# Patient Record
Sex: Female | Born: 1945
Health system: Southern US, Community
[De-identification: ages and names within clinical notes are randomized; demographics above are authoritative.]

## PROBLEM LIST (undated history)

## (undated) DIAGNOSIS — F419 Anxiety disorder, unspecified: Secondary | ICD-10-CM

## (undated) DIAGNOSIS — R011 Cardiac murmur, unspecified: Secondary | ICD-10-CM

## (undated) DIAGNOSIS — I10 Essential (primary) hypertension: Secondary | ICD-10-CM

## (undated) DIAGNOSIS — M8580 Other specified disorders of bone density and structure, unspecified site: Secondary | ICD-10-CM

## (undated) DIAGNOSIS — E7800 Pure hypercholesterolemia, unspecified: Secondary | ICD-10-CM

## (undated) DIAGNOSIS — N63 Unspecified lump in unspecified breast: Secondary | ICD-10-CM

## (undated) DIAGNOSIS — N3020 Other chronic cystitis without hematuria: Secondary | ICD-10-CM

## (undated) DIAGNOSIS — N870 Mild cervical dysplasia: Secondary | ICD-10-CM

## (undated) HISTORY — DX: Cardiac murmur, unspecified: R01.1

## (undated) HISTORY — DX: Unspecified lump in unspecified breast: N63.0

## (undated) HISTORY — PX: COLPOSCOPY: SHX161

## (undated) HISTORY — PX: CHOLECYSTECTOMY: SHX55

## (undated) HISTORY — PX: TUBAL LIGATION: SHX77

## (undated) HISTORY — DX: Mild cervical dysplasia: N87.0

## (undated) HISTORY — DX: Other specified disorders of bone density and structure, unspecified site: M85.80

## (undated) HISTORY — PX: OTHER SURGICAL HISTORY: SHX169

## (undated) HISTORY — PX: DILATION AND CURETTAGE OF UTERUS: SHX78

## (undated) HISTORY — PX: CATARACT EXTRACTION: SUR2

## (undated) HISTORY — DX: Other chronic cystitis without hematuria: N30.20

## (undated) HISTORY — DX: Pure hypercholesterolemia, unspecified: E78.00

## (undated) HISTORY — DX: Essential (primary) hypertension: I10

## (undated) HISTORY — PX: BREAST BIOPSY: SHX20

---

## 1999-02-11 ENCOUNTER — Other Ambulatory Visit: Admission: RE | Admit: 1999-02-11 | Discharge: 1999-02-11 | Payer: Self-pay | Admitting: Obstetrics and Gynecology

## 1999-11-15 ENCOUNTER — Emergency Department (HOSPITAL_COMMUNITY): Admission: EM | Admit: 1999-11-15 | Discharge: 1999-11-15 | Payer: Self-pay | Admitting: Podiatry

## 1999-11-15 ENCOUNTER — Encounter: Payer: Self-pay | Admitting: Emergency Medicine

## 1999-11-19 ENCOUNTER — Other Ambulatory Visit: Admission: RE | Admit: 1999-11-19 | Discharge: 1999-11-19 | Payer: Self-pay | Admitting: Obstetrics and Gynecology

## 2000-01-13 ENCOUNTER — Emergency Department (HOSPITAL_COMMUNITY): Admission: EM | Admit: 2000-01-13 | Discharge: 2000-01-13 | Payer: Self-pay | Admitting: Emergency Medicine

## 2000-02-29 ENCOUNTER — Encounter: Payer: Self-pay | Admitting: Obstetrics and Gynecology

## 2000-02-29 ENCOUNTER — Encounter: Admission: RE | Admit: 2000-02-29 | Discharge: 2000-02-29 | Payer: Self-pay | Admitting: Obstetrics and Gynecology

## 2000-07-25 ENCOUNTER — Other Ambulatory Visit: Admission: RE | Admit: 2000-07-25 | Discharge: 2000-07-25 | Payer: Self-pay | Admitting: Obstetrics and Gynecology

## 2001-03-02 ENCOUNTER — Encounter: Payer: Self-pay | Admitting: Obstetrics and Gynecology

## 2001-03-02 ENCOUNTER — Encounter: Admission: RE | Admit: 2001-03-02 | Discharge: 2001-03-02 | Payer: Self-pay | Admitting: Obstetrics and Gynecology

## 2001-07-12 ENCOUNTER — Emergency Department (HOSPITAL_COMMUNITY): Admission: EM | Admit: 2001-07-12 | Discharge: 2001-07-12 | Payer: Self-pay | Admitting: Emergency Medicine

## 2001-07-20 ENCOUNTER — Emergency Department (HOSPITAL_COMMUNITY): Admission: EM | Admit: 2001-07-20 | Discharge: 2001-07-20 | Payer: Self-pay | Admitting: Emergency Medicine

## 2001-07-21 ENCOUNTER — Emergency Department (HOSPITAL_COMMUNITY): Admission: EM | Admit: 2001-07-21 | Discharge: 2001-07-22 | Payer: Self-pay | Admitting: Emergency Medicine

## 2001-09-18 ENCOUNTER — Other Ambulatory Visit: Admission: RE | Admit: 2001-09-18 | Discharge: 2001-09-18 | Payer: Self-pay | Admitting: Obstetrics and Gynecology

## 2002-03-05 ENCOUNTER — Encounter: Payer: Self-pay | Admitting: Obstetrics and Gynecology

## 2002-03-05 ENCOUNTER — Encounter: Admission: RE | Admit: 2002-03-05 | Discharge: 2002-03-05 | Payer: Self-pay | Admitting: Obstetrics and Gynecology

## 2002-09-25 ENCOUNTER — Other Ambulatory Visit: Admission: RE | Admit: 2002-09-25 | Discharge: 2002-09-25 | Payer: Self-pay | Admitting: Obstetrics and Gynecology

## 2003-03-21 ENCOUNTER — Encounter: Payer: Self-pay | Admitting: Obstetrics and Gynecology

## 2003-03-21 ENCOUNTER — Encounter: Admission: RE | Admit: 2003-03-21 | Discharge: 2003-03-21 | Payer: Self-pay | Admitting: Obstetrics and Gynecology

## 2003-11-27 ENCOUNTER — Other Ambulatory Visit: Admission: RE | Admit: 2003-11-27 | Discharge: 2003-11-27 | Payer: Self-pay | Admitting: Obstetrics and Gynecology

## 2004-03-23 ENCOUNTER — Encounter: Admission: RE | Admit: 2004-03-23 | Discharge: 2004-03-23 | Payer: Self-pay | Admitting: Obstetrics and Gynecology

## 2004-12-02 ENCOUNTER — Ambulatory Visit (HOSPITAL_COMMUNITY): Admission: RE | Admit: 2004-12-02 | Discharge: 2004-12-02 | Payer: Self-pay | Admitting: Obstetrics and Gynecology

## 2004-12-02 ENCOUNTER — Other Ambulatory Visit: Admission: RE | Admit: 2004-12-02 | Discharge: 2004-12-02 | Payer: Self-pay | Admitting: Obstetrics and Gynecology

## 2004-12-14 ENCOUNTER — Ambulatory Visit: Payer: Self-pay | Admitting: Internal Medicine

## 2005-01-11 ENCOUNTER — Ambulatory Visit: Payer: Self-pay | Admitting: Internal Medicine

## 2005-02-15 ENCOUNTER — Ambulatory Visit: Payer: Self-pay | Admitting: Internal Medicine

## 2005-03-15 ENCOUNTER — Ambulatory Visit: Payer: Self-pay | Admitting: Internal Medicine

## 2005-03-29 ENCOUNTER — Encounter: Admission: RE | Admit: 2005-03-29 | Discharge: 2005-03-29 | Payer: Self-pay | Admitting: Obstetrics and Gynecology

## 2005-04-05 ENCOUNTER — Encounter: Admission: RE | Admit: 2005-04-05 | Discharge: 2005-04-05 | Payer: Self-pay | Admitting: Obstetrics and Gynecology

## 2005-04-12 ENCOUNTER — Ambulatory Visit: Payer: Self-pay | Admitting: Internal Medicine

## 2005-05-10 ENCOUNTER — Ambulatory Visit: Payer: Self-pay | Admitting: Internal Medicine

## 2005-06-14 ENCOUNTER — Ambulatory Visit: Payer: Self-pay | Admitting: Internal Medicine

## 2005-07-13 ENCOUNTER — Ambulatory Visit: Payer: Self-pay | Admitting: Internal Medicine

## 2005-07-14 ENCOUNTER — Ambulatory Visit: Payer: Self-pay | Admitting: Internal Medicine

## 2005-08-10 ENCOUNTER — Ambulatory Visit: Payer: Self-pay | Admitting: Internal Medicine

## 2005-09-15 ENCOUNTER — Ambulatory Visit: Payer: Self-pay | Admitting: Internal Medicine

## 2005-11-01 ENCOUNTER — Ambulatory Visit: Payer: Self-pay | Admitting: Internal Medicine

## 2005-12-03 ENCOUNTER — Other Ambulatory Visit: Admission: RE | Admit: 2005-12-03 | Discharge: 2005-12-03 | Payer: Self-pay | Admitting: Obstetrics and Gynecology

## 2005-12-07 ENCOUNTER — Ambulatory Visit: Payer: Self-pay | Admitting: Internal Medicine

## 2006-01-10 ENCOUNTER — Ambulatory Visit: Payer: Self-pay | Admitting: Internal Medicine

## 2006-02-07 ENCOUNTER — Ambulatory Visit: Payer: Self-pay | Admitting: Internal Medicine

## 2006-02-08 ENCOUNTER — Ambulatory Visit: Payer: Self-pay | Admitting: Internal Medicine

## 2006-03-31 ENCOUNTER — Encounter: Admission: RE | Admit: 2006-03-31 | Discharge: 2006-03-31 | Payer: Self-pay | Admitting: Obstetrics and Gynecology

## 2006-06-20 ENCOUNTER — Ambulatory Visit: Payer: Self-pay | Admitting: Internal Medicine

## 2006-12-05 ENCOUNTER — Other Ambulatory Visit: Admission: RE | Admit: 2006-12-05 | Discharge: 2006-12-05 | Payer: Self-pay | Admitting: Obstetrics and Gynecology

## 2007-04-03 ENCOUNTER — Encounter: Admission: RE | Admit: 2007-04-03 | Discharge: 2007-04-03 | Payer: Self-pay | Admitting: Family Medicine

## 2007-12-07 HISTORY — PX: CERVICAL BIOPSY  W/ LOOP ELECTRODE EXCISION: SUR135

## 2007-12-11 ENCOUNTER — Other Ambulatory Visit: Admission: RE | Admit: 2007-12-11 | Discharge: 2007-12-11 | Payer: Self-pay | Admitting: Obstetrics and Gynecology

## 2007-12-28 ENCOUNTER — Encounter: Payer: Self-pay | Admitting: Obstetrics and Gynecology

## 2007-12-28 ENCOUNTER — Ambulatory Visit (HOSPITAL_BASED_OUTPATIENT_CLINIC_OR_DEPARTMENT_OTHER): Admission: RE | Admit: 2007-12-28 | Discharge: 2007-12-28 | Payer: Self-pay | Admitting: Obstetrics and Gynecology

## 2008-04-10 ENCOUNTER — Encounter: Admission: RE | Admit: 2008-04-10 | Discharge: 2008-04-10 | Payer: Self-pay | Admitting: Obstetrics and Gynecology

## 2008-05-20 ENCOUNTER — Other Ambulatory Visit: Admission: RE | Admit: 2008-05-20 | Discharge: 2008-05-20 | Payer: Self-pay | Admitting: Obstetrics and Gynecology

## 2008-11-11 ENCOUNTER — Ambulatory Visit: Payer: Self-pay | Admitting: Obstetrics and Gynecology

## 2008-11-27 ENCOUNTER — Ambulatory Visit: Payer: Self-pay | Admitting: Obstetrics and Gynecology

## 2008-12-11 ENCOUNTER — Ambulatory Visit: Payer: Self-pay | Admitting: Obstetrics and Gynecology

## 2008-12-11 ENCOUNTER — Other Ambulatory Visit: Admission: RE | Admit: 2008-12-11 | Discharge: 2008-12-11 | Payer: Self-pay | Admitting: Obstetrics and Gynecology

## 2008-12-11 ENCOUNTER — Encounter: Payer: Self-pay | Admitting: Obstetrics and Gynecology

## 2009-02-07 ENCOUNTER — Ambulatory Visit: Payer: Self-pay | Admitting: Obstetrics and Gynecology

## 2009-02-26 ENCOUNTER — Ambulatory Visit: Payer: Self-pay | Admitting: Obstetrics and Gynecology

## 2009-04-22 ENCOUNTER — Encounter: Admission: RE | Admit: 2009-04-22 | Discharge: 2009-04-22 | Payer: Self-pay | Admitting: Obstetrics and Gynecology

## 2009-09-24 ENCOUNTER — Ambulatory Visit: Payer: Self-pay | Admitting: Obstetrics and Gynecology

## 2009-10-07 ENCOUNTER — Ambulatory Visit: Payer: Self-pay | Admitting: Obstetrics and Gynecology

## 2009-10-28 ENCOUNTER — Emergency Department (HOSPITAL_COMMUNITY): Admission: EM | Admit: 2009-10-28 | Discharge: 2009-10-28 | Payer: Self-pay | Admitting: Emergency Medicine

## 2010-01-16 ENCOUNTER — Ambulatory Visit (HOSPITAL_BASED_OUTPATIENT_CLINIC_OR_DEPARTMENT_OTHER): Admission: RE | Admit: 2010-01-16 | Discharge: 2010-01-16 | Payer: Self-pay | Admitting: Orthopedic Surgery

## 2010-03-29 ENCOUNTER — Emergency Department (HOSPITAL_COMMUNITY): Admission: EM | Admit: 2010-03-29 | Discharge: 2010-03-29 | Payer: Self-pay | Admitting: Emergency Medicine

## 2010-05-01 ENCOUNTER — Encounter: Admission: RE | Admit: 2010-05-01 | Discharge: 2010-05-01 | Payer: Self-pay | Admitting: Family Medicine

## 2010-05-27 ENCOUNTER — Ambulatory Visit: Payer: Self-pay | Admitting: Obstetrics and Gynecology

## 2010-05-27 ENCOUNTER — Other Ambulatory Visit: Admission: RE | Admit: 2010-05-27 | Discharge: 2010-05-27 | Payer: Self-pay | Admitting: Obstetrics and Gynecology

## 2011-02-26 ENCOUNTER — Ambulatory Visit (INDEPENDENT_AMBULATORY_CARE_PROVIDER_SITE_OTHER): Payer: PRIVATE HEALTH INSURANCE | Admitting: Women's Health

## 2011-02-26 DIAGNOSIS — R82998 Other abnormal findings in urine: Secondary | ICD-10-CM

## 2011-02-26 DIAGNOSIS — B373 Candidiasis of vulva and vagina: Secondary | ICD-10-CM

## 2011-02-26 DIAGNOSIS — N898 Other specified noninflammatory disorders of vagina: Secondary | ICD-10-CM

## 2011-03-24 ENCOUNTER — Other Ambulatory Visit: Payer: Self-pay | Admitting: Obstetrics and Gynecology

## 2011-03-24 DIAGNOSIS — Z1231 Encounter for screening mammogram for malignant neoplasm of breast: Secondary | ICD-10-CM

## 2011-04-20 NOTE — Op Note (Signed)
Sheila Keller, Sheila Keller               ACCOUNT NO.:  [id number]   MEDICAL RECORD NO.:  [id number]          PATIENT TYPE:  AMB   LOCATION:  [facility]                         FACILITY:  [facility]   PHYSICIAN:  Daniel L. Gottsegen, M.D.DATE OF BIRTH:  1946-07-31   DATE OF PROCEDURE:  12/28/2007  DATE OF DISCHARGE:                               OPERATIVE REPORT   PRE AND POSTOPERATIVE DIAGNOSIS:  Cervical intraepithelial neoplasia  with canal involvement.   OPERATIONS:  LEEP cone with endocervical button.   SURGEON:  Daniel L. Eda Paschal, M.D.   ANESTHESIA:  General.   INDICATIONS:  The patient is a 65 year old female who had presented to  the office for her yearly physical.  Pap smear showed cervical  intraepithelial neoplasia.  Colposcopy was done in the office revealing  some white epithelium entering the canal.  It was difficult to get high  into the canal because of cervical stenosis, so it was not a perfectly  satisfactory colposcopy; however colposcopic biopsies from the  ectocervix plus ECC from the canal as high as we could get both were  positive for dysplasia.  As a result of this she now enters for cervical  LEEP cone for treatment of cervical dysplasia with canal involvement.   FINDINGS:  External exam was normal.  B U S is normal.  Vaginal and  cervical exam shows significant atrophy.  Uterus is normal size and  shape.  Adnexa failed to reveal masses.  The patient was recolposcoped  and once again we could see white epithelium entering the external os  for as high as we could see with the colposcope.   PROCEDURE:  After adequate general anesthesia the patient was placed in  the dorsal supine position, prepped and draped usual sterile manner.  A  speculum coated specifically for LEEP was utilized.  Colposcopy was done  with 4% acetic acid.  Findings were as noted above.  A solution of  1:200,000 epinephrine and 1% Xylocaine was injected around the cervix.  Total of 8 mL was  utilized injecting all four quadrants.  Using a single-  tooth tenaculum and a very small dilator we were able to dilate her os  enough that we could now be comfortable that we had gotten safely into  the endocervical canal.  First a LEEP cone was done with a 20 x 12 loop,  settings were 60 coag, 60 cutting and a specimen to approximately 7 mm  was obtained.  It was cut at 12 o'clock and sent to pathology for tissue  diagnosis.  Because of the canal involvement an endocervical button was  then obtained with a much smaller loop without any trouble and this was  sent separately for tissue diagnosis.  There was little bit of oozing.  Using a ball-tip this was completely eradicated.  Monsel's was placed  and at the termination of procedure there was no bleeding whatsoever.  The patient left the operating satisfactory condition.  Blood loss was  minimal.      Reuel Boom L. Eda Paschal, M.D.  Electronically Signed     DLG/MEDQ  D:  12/28/2007  T:  12/28/2007  Job:  [id number]

## 2011-04-23 NOTE — Assessment & Plan Note (Signed)
[facility]                               PULMONARY OFFICE NOTE   NAME:Pacitti, VALISHA HESLIN                      MRN:          [id number]  DATE:06/20/2006                            DOB:          [birth year]    DIAGNOSES:  1.  Yellowjacket hypersensitivity.  2.  Allergic rhinitis.  3.  Diabetes.   HISTORY:  She was stung on the finger by a yellowjacket this morning around  10 a.m. when she swatted it away from her face with her hand.  She  immediately took 120 mg of Allegra.  She has an EpiPen but did not use it.  Now, about a half-hour after the sting, she has local swelling over the  finger and is anxious but otherwise does not notice systemic symptoms.   OBJECTIVE:  VITAL SIGNS:  Blood pressure 148/88, pulse regular at 90.  GENERAL:  She is alert.  Speech is clear.  Breathing is unlabored.  LUNGS:  Clear.  HEART:  Heart sounds are normal.  EXTREMITIES:  There is edema and erythema at the base of her right middle  finger with minimal edema and erythema extending also to the fourth finger.  She had taken her rings off.   IMPRESSION:  Local reaction to insect sting.  She had previously had about  five years of venom desensitization therapy.   PLAN:  She is going to wait her one-half hour for observation.  She is given  Zyrtec 10 mg p.o. and EpiPen injection, with observation, all well-  tolerated.  She will keep her scheduled appointment in September, returning  earlier p.r.n.  Stinging insect avoidance is again emphasized.                                   Clinton D. Maple Hudson, MD, FCCP, FACP   CDY/MedQ  DD:  06/23/2006  DT:  06/23/2006  Job #:  [id number]   cc:   Molly Maduro A. Nicholos Johns, MD

## 2011-06-03 ENCOUNTER — Other Ambulatory Visit (HOSPITAL_COMMUNITY)
Admission: RE | Admit: 2011-06-03 | Discharge: 2011-06-03 | Disposition: A | Discharge status: Home / Self Care | Payer: PRIVATE HEALTH INSURANCE | Source: Ambulatory Visit | Attending: Obstetrics and Gynecology | Admitting: Obstetrics and Gynecology

## 2011-06-03 ENCOUNTER — Encounter (INDEPENDENT_AMBULATORY_CARE_PROVIDER_SITE_OTHER): Payer: PRIVATE HEALTH INSURANCE | Admitting: Obstetrics and Gynecology

## 2011-06-03 ENCOUNTER — Other Ambulatory Visit: Payer: Self-pay | Admitting: Obstetrics and Gynecology

## 2011-06-03 DIAGNOSIS — R809 Proteinuria, unspecified: Secondary | ICD-10-CM

## 2011-06-03 DIAGNOSIS — B373 Candidiasis of vulva and vagina: Secondary | ICD-10-CM

## 2011-06-03 DIAGNOSIS — Z01419 Encounter for gynecological examination (general) (routine) without abnormal findings: Secondary | ICD-10-CM

## 2011-06-03 DIAGNOSIS — Z124 Encounter for screening for malignant neoplasm of cervix: Secondary | ICD-10-CM | POA: Insufficient documentation

## 2011-06-03 DIAGNOSIS — B3731 Acute candidiasis of vulva and vagina: Secondary | ICD-10-CM

## 2011-06-04 ENCOUNTER — Ambulatory Visit
Admission: RE | Admit: 2011-06-04 | Discharge: 2011-06-04 | Disposition: A | Discharge status: Home / Self Care | Payer: PRIVATE HEALTH INSURANCE | Source: Ambulatory Visit | Attending: Obstetrics and Gynecology | Admitting: Obstetrics and Gynecology

## 2011-06-04 ENCOUNTER — Encounter: Payer: PRIVATE HEALTH INSURANCE | Admitting: Obstetrics and Gynecology

## 2011-06-04 DIAGNOSIS — Z1231 Encounter for screening mammogram for malignant neoplasm of breast: Secondary | ICD-10-CM

## 2011-08-26 LAB — I-STAT 8, (EC8 V) (CONVERTED LAB)
Acid-base deficit: 3 — ABNORMAL HIGH
BUN: 7
Bicarbonate: 19.7 — ABNORMAL LOW
Chloride: 110
Glucose, Bld: 104 — ABNORMAL HIGH
HCT: 41
Hemoglobin: 13.9
Operator id: 114531
Potassium: 4.2
Sodium: 140
TCO2: 21
pCO2, Ven: 28.7 — ABNORMAL LOW
pH, Ven: 7.444 — ABNORMAL HIGH

## 2012-01-24 ENCOUNTER — Ambulatory Visit (INDEPENDENT_AMBULATORY_CARE_PROVIDER_SITE_OTHER): Payer: PRIVATE HEALTH INSURANCE | Admitting: Obstetrics and Gynecology

## 2012-01-24 VITALS — BP 124/78

## 2012-01-24 DIAGNOSIS — N9089 Other specified noninflammatory disorders of vulva and perineum: Secondary | ICD-10-CM

## 2012-01-24 DIAGNOSIS — N898 Other specified noninflammatory disorders of vagina: Secondary | ICD-10-CM

## 2012-01-24 MED ORDER — FLUCONAZOLE 150 MG PO TABS: 150.0000 mg | ORAL_TABLET | Freq: Every day | ORAL | Status: AC

## 2012-01-24 NOTE — Progress Notes (Signed)
Patient came to see me today with to concerns. #1 she has a raised area near her left labia which is new. #2 she is having some vaginal discharge with itching. She is diabetic.  Exam: Kennon Portela present. External: Narrow left buttock on the left there is a raised red lesion which is nonfluctuant. It is 1-2 mm. There is no surrounding cellulitis. It is not atypical looking at all. BUS: Within normal limits. Vaginal exam: Typical yeast like discharge. Wet prep was negative.  Assessment: #1. Nonsuspicious new lesion of left vulva. #2 yeast vaginitis.  Plan: I do not believe this lesion needs to be biopsied. She will clean it with pHisoHex and warm water. Diflucan 150 mg daily for 4 days.

## 2012-01-26 ENCOUNTER — Telehealth: Payer: Self-pay | Admitting: *Deleted

## 2012-01-26 MED ORDER — HEXACHLOROPHENE 3 % EX LIQD: Freq: Every day | CUTANEOUS | Status: AC

## 2012-01-26 NOTE — Telephone Encounter (Signed)
Give her pHisoHex. It is a so that comes a bottle. Have her apply it to the area 3 times a day.

## 2012-01-26 NOTE — Telephone Encounter (Signed)
rx sent to pharmacy, pt informed rx sent to pharmacy.

## 2012-01-26 NOTE — Telephone Encounter (Signed)
Pt was seen on 01/24/12 and was told to buy pHisoHex. Pt went to pharmacy and was told she will need a rx for this. Please advise

## 2012-05-08 ENCOUNTER — Other Ambulatory Visit: Payer: Self-pay | Admitting: Obstetrics and Gynecology

## 2012-05-08 DIAGNOSIS — Z1231 Encounter for screening mammogram for malignant neoplasm of breast: Secondary | ICD-10-CM

## 2012-06-05 ENCOUNTER — Ambulatory Visit
Admission: RE | Admit: 2012-06-05 | Discharge: 2012-06-05 | Disposition: A | Discharge status: Home / Self Care | Payer: PRIVATE HEALTH INSURANCE | Source: Ambulatory Visit | Attending: Obstetrics and Gynecology | Admitting: Obstetrics and Gynecology

## 2012-06-05 ENCOUNTER — Other Ambulatory Visit (HOSPITAL_COMMUNITY)
Admission: RE | Admit: 2012-06-05 | Discharge: 2012-06-05 | Disposition: A | Discharge status: Home / Self Care | Payer: PRIVATE HEALTH INSURANCE | Source: Ambulatory Visit | Attending: Obstetrics and Gynecology | Admitting: Obstetrics and Gynecology

## 2012-06-05 ENCOUNTER — Encounter: Payer: Self-pay | Admitting: Obstetrics and Gynecology

## 2012-06-05 ENCOUNTER — Ambulatory Visit (INDEPENDENT_AMBULATORY_CARE_PROVIDER_SITE_OTHER): Payer: PRIVATE HEALTH INSURANCE | Admitting: Obstetrics and Gynecology

## 2012-06-05 VITALS — BP 116/74 | Ht 63.5 in | Wt 232.0 lb

## 2012-06-05 DIAGNOSIS — Z1231 Encounter for screening mammogram for malignant neoplasm of breast: Secondary | ICD-10-CM

## 2012-06-05 DIAGNOSIS — Z01419 Encounter for gynecological examination (general) (routine) without abnormal findings: Secondary | ICD-10-CM | POA: Insufficient documentation

## 2012-06-05 DIAGNOSIS — M8580 Other specified disorders of bone density and structure, unspecified site: Secondary | ICD-10-CM

## 2012-06-05 DIAGNOSIS — I10 Essential (primary) hypertension: Secondary | ICD-10-CM | POA: Insufficient documentation

## 2012-06-05 DIAGNOSIS — M949 Disorder of cartilage, unspecified: Secondary | ICD-10-CM

## 2012-06-05 DIAGNOSIS — E7800 Pure hypercholesterolemia, unspecified: Secondary | ICD-10-CM | POA: Insufficient documentation

## 2012-06-05 NOTE — Patient Instructions (Addendum)
Call cancer center and make appointment to discuss BRCA-1 and BRCA-2 testing due to mother and aunt.

## 2012-06-05 NOTE — Progress Notes (Signed)
Patient came to see me today for her annual GYN exam. She was treated by me with a LEEP in 2009 for CIN-1 involving the endocervical canal and has had 3 normal Pap since then. She is having no vaginal bleeding. She is having no pelvic pain. She is scheduled for a mammogram today. She is due for followup bone density due to osteopenia. She has had no fractures. Her mother died this year. She does have some hot flashes. She seems to do well with them. She takes Evista through Dr. Norvel Richards office and does well. He does her lab work. Her mother had breast cancer at age 70 and her maternal aunt had even younger. There is no other family history of breast or ovarian cancer.  Physical examination: Kennon Portela present.  HEENT within normal limits. Neck: Thyroid not large. No masses. Supraclavicular nodes: not enlarged. Breasts: Examined in both sitting and lying  position. No skin changes and no masses. Abdomen: Soft no guarding rebound or masses or hernia. Pelvic: External: Within normal limits. BUS: Within normal limits. Vaginal:within normal limits. Good estrogen effect. No evidence of cystocele rectocele or enterocele. Cervix: clean. Uterus: Normal size and shape. Adnexa: No masses. Rectovaginal exam: Confirmatory and negative. Extremities: Within normal limits.  Assessment: #1. CIN-1 with endocervical canal involvement #2. Osteopenia #3. Family history of early onset breast cancer  Plan: Mammogram. Bone density. Genetic counseling at the breast Center.The new Pap smear guidelines were discussed with the patient. Pap smear done.

## 2012-06-05 NOTE — Addendum Note (Signed)
Addended by: Dayna Barker on: 06/05/2012 09:51 AM   Modules accepted: Orders

## 2012-06-06 LAB — URINALYSIS W MICROSCOPIC + REFLEX CULTURE
Casts: NONE SEEN
Crystals: NONE SEEN
Glucose, UA: NEGATIVE mg/dL
Hgb urine dipstick: NEGATIVE
Ketones, ur: NEGATIVE mg/dL
Nitrite: NEGATIVE
Specific Gravity, Urine: 1.026 (ref 1.005–1.030)
pH: 5.5 (ref 5.0–8.0)

## 2012-06-07 LAB — URINE CULTURE

## 2012-07-27 ENCOUNTER — Other Ambulatory Visit: Payer: Self-pay | Admitting: Gynecology

## 2012-07-27 ENCOUNTER — Ambulatory Visit (INDEPENDENT_AMBULATORY_CARE_PROVIDER_SITE_OTHER): Payer: PRIVATE HEALTH INSURANCE | Admitting: Gynecology

## 2012-07-27 ENCOUNTER — Encounter: Payer: Self-pay | Admitting: Gynecology

## 2012-07-27 VITALS — BP 130/70

## 2012-07-27 DIAGNOSIS — R3915 Urgency of urination: Secondary | ICD-10-CM

## 2012-07-27 DIAGNOSIS — N390 Urinary tract infection, site not specified: Secondary | ICD-10-CM

## 2012-07-27 LAB — URINALYSIS W MICROSCOPIC + REFLEX CULTURE
Casts: NONE SEEN
Crystals: NONE SEEN
Glucose, UA: NEGATIVE mg/dL
Ketones, ur: NEGATIVE mg/dL
Nitrite: NEGATIVE
Specific Gravity, Urine: 1.025 (ref 1.005–1.030)
pH: 5 (ref 5.0–8.0)

## 2012-07-27 MED ORDER — NITROFURANTOIN MONOHYD MACRO 100 MG PO CAPS: 100.0000 mg | ORAL_CAPSULE | Freq: Two times a day (BID) | ORAL | Status: AC

## 2012-07-27 NOTE — Patient Instructions (Addendum)

## 2012-07-27 NOTE — Progress Notes (Signed)
Patient presented to the office today complaining for the past 3 days of urinary frequency and completely emptying and some slight back discomfort and suprapubic pressure. She denied any fever chills nausea or vomiting. She is allergic to penicillin.  Exam: Back no CVA tenderness Abdomen is some slight suprapubic tenderness  Urinalysis 7-10 WBCs, 3-6 RBC and rare bacteria  Assessment/plan based on patient's clinical symptoms we'll treat her for urinary tract infection with Macrobid one by mouth twice a day for 7 days and give her a sample of Uribell antispasmodic agent to take 1 by mouth 4 times a day for 2 days. Culture pending at time of this dictation.

## 2012-07-31 ENCOUNTER — Other Ambulatory Visit: Payer: Self-pay | Admitting: Gynecology

## 2012-07-31 MED ORDER — CIPROFLOXACIN HCL 250 MG PO TABS: 250.0000 mg | ORAL_TABLET | Freq: Two times a day (BID) | ORAL | Status: AC

## 2012-08-09 ENCOUNTER — Ambulatory Visit (INDEPENDENT_AMBULATORY_CARE_PROVIDER_SITE_OTHER): Payer: PRIVATE HEALTH INSURANCE | Admitting: Obstetrics and Gynecology

## 2012-08-09 DIAGNOSIS — N764 Abscess of vulva: Secondary | ICD-10-CM

## 2012-08-09 MED ORDER — SULFAMETHOXAZOLE-TRIMETHOPRIM 800-160 MG PO TABS: 1.0000 | ORAL_TABLET | Freq: Two times a day (BID) | ORAL | Status: AC

## 2012-08-09 NOTE — Patient Instructions (Signed)
Warm soaks to the left labia 4 times a day. Take antibiotic 2 times a day. Return to the office next week.

## 2012-08-09 NOTE — Progress Notes (Signed)
Patient came to see me today with an abscess of her left labia that has been there for less than one week and is causing severe pain and inability to sit.  Exam: Kennon Portela present. External: There is a 2 cm abscess of her left labia with slight skin cellulitis.  Assessment: Left labial abscess  Plan: The area was prepped and draped in a sterile manner. One percent plain Xylocaine was used for anesthesia. An incision was made over the abscess with a scalpel. Significant drainage occurred. Loculations were broken up with a hemostat. Hemostasis was obtained with Monsel solution. The patient will continue to soak the area 4 times a day. She was started on Septra DS twice a day for 7 days. She will return to the office next week.

## 2012-08-14 ENCOUNTER — Ambulatory Visit (INDEPENDENT_AMBULATORY_CARE_PROVIDER_SITE_OTHER): Payer: PRIVATE HEALTH INSURANCE | Admitting: Obstetrics and Gynecology

## 2012-08-14 DIAGNOSIS — N764 Abscess of vulva: Secondary | ICD-10-CM

## 2012-08-14 DIAGNOSIS — N9089 Other specified noninflammatory disorders of vulva and perineum: Secondary | ICD-10-CM

## 2012-08-14 NOTE — Patient Instructions (Signed)
Continue warm soaks. Finish antibiotics. Return in 2 weeks if not completely healed.

## 2012-08-14 NOTE — Progress Notes (Signed)
Patient came to see me today for followup after I had I and D a labial abscess last week. She is feeling much better. She continues to soak it. She is almost done with her antibiotics.  Exam: Kennon Portela present. External: This site of the incision is healing well. All cellulitis is gone. There is no fluctuance left.  Assessment: Healing labial abscess.  Plan: Continue warm soaks. Finish antibiotics. Return in 2 weeks if not 100% healed.

## 2013-01-03 ENCOUNTER — Other Ambulatory Visit: Payer: Self-pay | Admitting: Gynecology

## 2013-01-03 DIAGNOSIS — M8580 Other specified disorders of bone density and structure, unspecified site: Secondary | ICD-10-CM

## 2013-01-04 ENCOUNTER — Encounter: Payer: Self-pay | Admitting: Gynecology

## 2013-01-04 ENCOUNTER — Ambulatory Visit (INDEPENDENT_AMBULATORY_CARE_PROVIDER_SITE_OTHER): Payer: PRIVATE HEALTH INSURANCE | Admitting: Gynecology

## 2013-01-04 ENCOUNTER — Ambulatory Visit (INDEPENDENT_AMBULATORY_CARE_PROVIDER_SITE_OTHER): Payer: PRIVATE HEALTH INSURANCE

## 2013-01-04 VITALS — BP 128/74

## 2013-01-04 DIAGNOSIS — M8580 Other specified disorders of bone density and structure, unspecified site: Secondary | ICD-10-CM

## 2013-01-04 DIAGNOSIS — M899 Disorder of bone, unspecified: Secondary | ICD-10-CM

## 2013-01-04 DIAGNOSIS — R300 Dysuria: Secondary | ICD-10-CM

## 2013-01-04 DIAGNOSIS — N390 Urinary tract infection, site not specified: Secondary | ICD-10-CM

## 2013-01-04 LAB — URINALYSIS W MICROSCOPIC + REFLEX CULTURE
Casts: NONE SEEN
Crystals: NONE SEEN
Nitrite: NEGATIVE
Specific Gravity, Urine: >1.03 — ABNORMAL HIGH (ref 1.005–1.030)
Urobilinogen, UA: 0.2 mg/dL (ref 0.0–1.0)
pH: 5 (ref 5.0–8.0)

## 2013-01-04 MED ORDER — FLUCONAZOLE 100 MG PO TABS: ORAL_TABLET | ORAL | Status: DC

## 2013-01-04 MED ORDER — CIPROFLOXACIN HCL 250 MG PO TABS: 250.0000 mg | ORAL_TABLET | Freq: Two times a day (BID) | ORAL | Status: DC

## 2013-01-04 NOTE — Progress Notes (Signed)
67 year old patient who presented to the office today stating that for the past 2 days she's been complaining of frequency and burning along with some suprapubic discomfort and some vague low back discomfort. She denied any fever chills nausea or vomiting. She is a type II diabetic. She is allergic to penicillin.  Exam: Patient with no CVA tenderness some slight suprapubic tenderness no pelvic exam done  Urinalysis demonstrated 11-20 WBC, many bacteria  Assessment/plan: Urinary tract infection and she will be treated with Cipro 250 mg twice a day for 3 days. She was given sample of Uribell to take 1 by mouth 4 times a day for 2 days. Patient states that she becomes sensitive to antibiotics and sometimes may develop yeast infection for this reason a prescription for Diflucan 100 mg to take 1 by mouth was provided as well.

## 2013-01-04 NOTE — Patient Instructions (Addendum)
Urinary Tract Infection Urinary tract infections (UTIs) can develop anywhere along your urinary tract. Your urinary tract is your body's drainage system for removing wastes and extra water. Your urinary tract includes two kidneys, two ureters, a bladder, and a urethra. Your kidneys are a pair of bean-shaped organs. Each kidney is about the size of your fist. They are located below your ribs, one on each side of your spine. CAUSES Infections are caused by microbes, which are microscopic organisms, including fungi, viruses, and bacteria. These organisms are so small that they can only be seen through a microscope. Bacteria are the microbes that most commonly cause UTIs. SYMPTOMS  Symptoms of UTIs may vary by age and gender of the patient and by the location of the infection. Symptoms in young women typically include a frequent and intense urge to urinate and a painful, burning feeling in the bladder or urethra during urination. Older women and men are more likely to be tired, shaky, and weak and have muscle aches and abdominal pain. A fever may mean the infection is in your kidneys. Other symptoms of a kidney infection include pain in your back or sides below the ribs, nausea, and vomiting. DIAGNOSIS To diagnose a UTI, your caregiver will ask you about your symptoms. Your caregiver also will ask to provide a urine sample. The urine sample will be tested for bacteria and white blood cells. White blood cells are made by your body to help fight infection. TREATMENT  Typically, UTIs can be treated with medication. Because most UTIs are caused by a bacterial infection, they usually can be treated with the use of antibiotics. The choice of antibiotic and length of treatment depend on your symptoms and the type of bacteria causing your infection. HOME CARE INSTRUCTIONS  If you were prescribed antibiotics, take them exactly as your caregiver instructs you. Finish the medication even if you feel better after you  have only taken some of the medication.  Drink enough water and fluids to keep your urine clear or pale yellow.  Avoid caffeine, tea, and carbonated beverages. They tend to irritate your bladder.  Empty your bladder often. Avoid holding urine for long periods of time.  Empty your bladder before and after sexual intercourse.  After a bowel movement, women should cleanse from front to back. Use each tissue only once. SEEK MEDICAL CARE IF:   You have back pain.  You develop a fever.  Your symptoms do not begin to resolve within 3 days. SEEK IMMEDIATE MEDICAL CARE IF:   You have severe back pain or lower abdominal pain.  You develop chills.  You have nausea or vomiting.  You have continued burning or discomfort with urination. MAKE SURE YOU:   Understand these instructions.  Will watch your condition.  Will get help right away if you are not doing well or get worse. Document Released: 09/01/2005 Document Revised: 05/23/2012 Document Reviewed: 12/31/2011 Exit[employer] Patient Information 2013 [employer].  

## 2013-01-05 LAB — URINE CULTURE

## 2013-01-11 ENCOUNTER — Other Ambulatory Visit: Payer: Self-pay | Admitting: *Deleted

## 2013-01-11 DIAGNOSIS — M8580 Other specified disorders of bone density and structure, unspecified site: Secondary | ICD-10-CM

## 2013-01-12 ENCOUNTER — Other Ambulatory Visit: Payer: PRIVATE HEALTH INSURANCE

## 2013-01-12 DIAGNOSIS — M8580 Other specified disorders of bone density and structure, unspecified site: Secondary | ICD-10-CM

## 2013-01-13 LAB — VITAMIN D 25 HYDROXY (VIT D DEFICIENCY, FRACTURES): Vit D, 25-Hydroxy: <10 ng/mL — ABNORMAL LOW (ref 30–89)

## 2013-01-15 ENCOUNTER — Other Ambulatory Visit: Payer: Self-pay | Admitting: Gynecology

## 2013-01-15 DIAGNOSIS — E559 Vitamin D deficiency, unspecified: Secondary | ICD-10-CM

## 2013-01-15 LAB — PTH, INTACT AND CALCIUM: Calcium, Total (PTH): 8.9 mg/dL (ref 8.4–10.5)

## 2013-01-15 MED ORDER — ERGOCALCIFEROL 1.25 MG (50000 UT) PO CAPS: 50000.0000 [IU] | ORAL_CAPSULE | ORAL | Status: DC

## 2013-04-02 ENCOUNTER — Other Ambulatory Visit: Payer: PRIVATE HEALTH INSURANCE

## 2013-04-02 DIAGNOSIS — E559 Vitamin D deficiency, unspecified: Secondary | ICD-10-CM

## 2013-04-03 ENCOUNTER — Other Ambulatory Visit: Payer: Self-pay | Admitting: Gynecology

## 2013-04-03 DIAGNOSIS — E559 Vitamin D deficiency, unspecified: Secondary | ICD-10-CM

## 2013-04-03 LAB — PTH, INTACT AND CALCIUM: PTH: 96.6 pg/mL — ABNORMAL HIGH (ref 14.0–72.0)

## 2013-04-03 MED ORDER — ERGOCALCIFEROL 1.25 MG (50000 UT) PO CAPS: ORAL_CAPSULE | ORAL | Status: DC

## 2013-04-24 ENCOUNTER — Other Ambulatory Visit: Payer: Self-pay

## 2013-05-02 ENCOUNTER — Other Ambulatory Visit: Payer: Self-pay

## 2013-05-02 DIAGNOSIS — Z1231 Encounter for screening mammogram for malignant neoplasm of breast: Secondary | ICD-10-CM

## 2013-06-11 ENCOUNTER — Ambulatory Visit
Admission: RE | Admit: 2013-06-11 | Discharge: 2013-06-11 | Disposition: A | Discharge status: Home / Self Care | Payer: PRIVATE HEALTH INSURANCE | Source: Ambulatory Visit

## 2013-06-11 DIAGNOSIS — Z1231 Encounter for screening mammogram for malignant neoplasm of breast: Secondary | ICD-10-CM

## 2013-06-15 ENCOUNTER — Other Ambulatory Visit: Payer: PRIVATE HEALTH INSURANCE

## 2013-06-15 DIAGNOSIS — E559 Vitamin D deficiency, unspecified: Secondary | ICD-10-CM

## 2013-06-22 ENCOUNTER — Encounter: Payer: Self-pay | Admitting: Gynecology

## 2013-06-22 ENCOUNTER — Other Ambulatory Visit (HOSPITAL_COMMUNITY)
Admission: RE | Admit: 2013-06-22 | Discharge: 2013-06-22 | Disposition: A | Discharge status: Home / Self Care | Payer: PRIVATE HEALTH INSURANCE | Source: Ambulatory Visit | Attending: Gynecology | Admitting: Gynecology

## 2013-06-22 ENCOUNTER — Ambulatory Visit (INDEPENDENT_AMBULATORY_CARE_PROVIDER_SITE_OTHER): Payer: PRIVATE HEALTH INSURANCE | Admitting: Gynecology

## 2013-06-22 VITALS — BP 138/86 | Ht 63.5 in | Wt 242.0 lb

## 2013-06-22 DIAGNOSIS — Z1159 Encounter for screening for other viral diseases: Secondary | ICD-10-CM

## 2013-06-22 DIAGNOSIS — M8580 Other specified disorders of bone density and structure, unspecified site: Secondary | ICD-10-CM

## 2013-06-22 DIAGNOSIS — Z1151 Encounter for screening for human papillomavirus (HPV): Secondary | ICD-10-CM | POA: Insufficient documentation

## 2013-06-22 DIAGNOSIS — Z8639 Personal history of other endocrine, nutritional and metabolic disease: Secondary | ICD-10-CM

## 2013-06-22 DIAGNOSIS — Z01419 Encounter for gynecological examination (general) (routine) without abnormal findings: Secondary | ICD-10-CM

## 2013-06-22 DIAGNOSIS — Z124 Encounter for screening for malignant neoplasm of cervix: Secondary | ICD-10-CM

## 2013-06-22 DIAGNOSIS — Z780 Asymptomatic menopausal state: Secondary | ICD-10-CM

## 2013-06-22 DIAGNOSIS — E663 Overweight: Secondary | ICD-10-CM

## 2013-06-22 DIAGNOSIS — D259 Leiomyoma of uterus, unspecified: Secondary | ICD-10-CM

## 2013-06-22 LAB — CBC WITH DIFFERENTIAL/PLATELET
Basophils Absolute: 0 10*3/uL (ref 0.0–0.1)
Basophils Relative: 1 % (ref 0–1)
MCHC: 33.2 g/dL (ref 30.0–36.0)
Neutro Abs: 2.3 10*3/uL (ref 1.7–7.7)
Neutrophils Relative %: 47 % (ref 43–77)
RDW: 14 % (ref 11.5–15.5)

## 2013-06-22 NOTE — Progress Notes (Signed)
Sheila Keller [birth year] [id number]   History:    67 y.o. for followup and GYN exam. Patient with history of osteopenia. Patient early this year was diagnosed with vitamin D deficiency. Her vitamin D level was as low as 10. She was started on vitamin D 50,000 units q. Weekly for 12 weeks. Her follow vitamin D level returned back to normal with a value of 30. Her lowest T score on the bone density study in January this year was -1.3 right femoral neck. Her primary physician is Dr.Reid who has been treating her for her hyperlipidemia and type 2 diabetes and has been doing most of her lab work. Patient has continued to gain weight. Patient has history of LEEP cervical conization in 2009 involving the endocervical canal and her followup Pap smears have been negative since then. She denies any vaginal bleeding or any vaginal irritation. She is on no hormone replacement therapy. She is on Evista 60 mg daily for her osteopenia that her primary physician placed her on. Review of her record indicates she had a left labial abscess in 2013 and has done well since then. Patient's last mammogram was earlier this month which was normal. Patient does her monthly breast exam. Patient has strong family history of breast cancer or by her mother was diagnosed with it at age 81. Patient had been offered BRCA1 and BRCA2 testing counseling at [facility] but has not gotten around to do so. Patient states that her Tdap, Pneumovax, and she was vaccine are up-to-date.   Past medical history,surgical history, family history and social history were all reviewed and documented in the EPIC chart.  Gynecologic History No LMP recorded. Patient is postmenopausal. Contraception: none Last Pap: 2013. Results were: normal Last mammogram: 2014. Results were: normal  Obstetric History OB History   Grav Para Term Preterm Abortions TAB SAB Ect Mult Living   2 2 2       2      # Outc Date GA Lbr Len/2nd Wgt Sex Del  Anes PTL Lv   1 TRM            2 TRM                ROS: A ROS was performed and pertinent positives and negatives are included in the history.  GENERAL: No fevers or chills. HEENT: No change in vision, no earache, sore throat or sinus congestion. NECK: No pain or stiffness. CARDIOVASCULAR: No chest pain or pressure. No palpitations. PULMONARY: No shortness of breath, cough or wheeze. GASTROINTESTINAL: No abdominal pain, nausea, vomiting or diarrhea, melena or bright red blood per rectum. GENITOURINARY: No urinary frequency, urgency, hesitancy or dysuria. MUSCULOSKELETAL: No joint or muscle pain, no back pain, no recent trauma. DERMATOLOGIC: No rash, no itching, no lesions. ENDOCRINE: No polyuria, polydipsia, no heat or cold intolerance. No recent change in weight. HEMATOLOGICAL: No anemia or easy bruising or bleeding. NEUROLOGIC: No headache, seizures, numbness, tingling or weakness. PSYCHIATRIC: No depression, no loss of interest in normal activity or change in sleep pattern.     Exam: chaperone present  BP 138/86  Ht 5' 3.5" (1.613 m)  Wt 242 lb (109.77 kg)  BMI 42.19 kg/m2  Body mass index is 42.19 kg/(m^2).  General appearance : Well developed well nourished female. No acute distress HEENT: Neck supple, trachea midline, no carotid bruits, no thyroidmegaly Lungs: Clear to auscultation, no rhonchi or wheezes, or rib retractions  Heart: Regular rate and rhythm, no murmurs  or gallops Breast:Examined in sitting and supine position were symmetrical in appearance, no palpable masses or tenderness,  no skin retraction, no nipple inversion, no nipple discharge, no skin discoloration, no axillary or supraclavicular lymphadenopathy Abdomen: no palpable masses or tenderness, no rebound or guarding Extremities: no edema or skin discoloration or tenderness  Pelvic:  Bartholin, Urethra, Skene Glands: Within normal limits             Vagina: No gross lesions or discharge  Cervix: No gross  lesions or discharge  Uterus  Anteverted? Limited exam due to patient's abdominal girth,   Adnexa  Difficult to evaluate due to patient's abdominal girth Anus and perineum  normal   Rectovaginal  normal sphincter tone without palpated masses or tenderness             Hemoccult course provided     Assessment/Plan:  67 y.o. female who is no longer complaining of tiredness and fatigue since her vitamin D deficiency was corrected. We will check her vitamin D level today. Because of her weight gain will check her TSH. We'll also check a CBC along with her urinalysis and Pap smear. The new Pap smear screening guidelines were discussed. Because she was born in 1947 she will have a one single lifetime hepatitis C screen as recommendation by the CBC. Because of limited bimanual pelvic exam and her history of fibroid uterus and ultrasound will be ordered in the next couple of weeks. Her colonoscopy was normal in 2011 Hemoccult cards were provided for her to submit to the office for testing. We are going to offer her over-the-counter Rehresh or Luvena  Probiotic gel to maintain adequate vaginal pH and prevent recurrent yeast and BV especially since she is a diabetic.  New CDC guidelines is recommending patients be tested once in her lifetime for hepatitis C antibody who were born between [birth year] through 1965. This was discussed with the patient today and has agreed to be tested today.  Patient was reminded to do her monthly self breast exam.    Ok Edwards MD, 9:33 AM 06/22/2013

## 2013-06-22 NOTE — Patient Instructions (Addendum)

## 2013-06-23 LAB — URINALYSIS W MICROSCOPIC + REFLEX CULTURE
Hgb urine dipstick: NEGATIVE
Nitrite: NEGATIVE
Protein, ur: NEGATIVE mg/dL

## 2013-06-23 LAB — TSH: TSH: 1.966 u[IU]/mL (ref 0.350–4.500)

## 2013-06-23 LAB — HEPATITIS C ANTIBODY: HCV Ab: NEGATIVE

## 2013-06-23 LAB — VITAMIN D 25 HYDROXY (VIT D DEFICIENCY, FRACTURES): Vit D, 25-Hydroxy: 30 ng/mL (ref 30–89)

## 2013-06-24 LAB — URINE CULTURE

## 2013-06-26 ENCOUNTER — Encounter: Payer: Self-pay | Admitting: Obstetrics and Gynecology

## 2013-06-28 ENCOUNTER — Encounter: Payer: Self-pay | Admitting: Obstetrics and Gynecology

## 2013-07-09 ENCOUNTER — Other Ambulatory Visit: Payer: PRIVATE HEALTH INSURANCE

## 2013-07-09 ENCOUNTER — Ambulatory Visit: Payer: PRIVATE HEALTH INSURANCE | Admitting: Gynecology

## 2013-09-18 ENCOUNTER — Other Ambulatory Visit: Payer: Self-pay | Admitting: Gynecology

## 2013-09-19 ENCOUNTER — Encounter: Payer: Self-pay | Admitting: Women's Health

## 2013-09-19 ENCOUNTER — Ambulatory Visit (INDEPENDENT_AMBULATORY_CARE_PROVIDER_SITE_OTHER): Payer: PRIVATE HEALTH INSURANCE | Admitting: Women's Health

## 2013-09-19 DIAGNOSIS — N390 Urinary tract infection, site not specified: Secondary | ICD-10-CM

## 2013-09-19 DIAGNOSIS — E119 Type 2 diabetes mellitus without complications: Secondary | ICD-10-CM

## 2013-09-19 DIAGNOSIS — R300 Dysuria: Secondary | ICD-10-CM

## 2013-09-19 LAB — URINALYSIS W MICROSCOPIC + REFLEX CULTURE
Glucose, UA: NEGATIVE mg/dL
Hgb urine dipstick: NEGATIVE
Protein, ur: NEGATIVE mg/dL
Urobilinogen, UA: 0.2 mg/dL (ref 0.0–1.0)

## 2013-09-19 MED ORDER — CIPROFLOXACIN HCL 250 MG PO TABS: 250.0000 mg | ORAL_TABLET | Freq: Two times a day (BID) | ORAL | Status: DC

## 2013-09-19 NOTE — Patient Instructions (Signed)
Urinary Tract Infection  Urinary tract infections (UTIs) can develop anywhere along your urinary tract. Your urinary tract is your body's drainage system for removing wastes and extra water. Your urinary tract includes two kidneys, two ureters, a bladder, and a urethra. Your kidneys are a pair of bean-shaped organs. Each kidney is about the size of your fist. They are located below your ribs, one on each side of your spine.  CAUSES  Infections are caused by microbes, which are microscopic organisms, including fungi, viruses, and bacteria. These organisms are so small that they can only be seen through a microscope. Bacteria are the microbes that most commonly cause UTIs.  SYMPTOMS   Symptoms of UTIs may vary by age and gender of the patient and by the location of the infection. Symptoms in Sheila Keller women typically include a frequent and intense urge to urinate and a painful, burning feeling in the bladder or urethra during urination. Older women and men are more likely to be tired, shaky, and weak and have muscle aches and abdominal pain. A fever may mean the infection is in your kidneys. Other symptoms of a kidney infection include pain in your back or sides below the ribs, nausea, and vomiting.  DIAGNOSIS  To diagnose a UTI, your caregiver will ask you about your symptoms. Your caregiver also will ask to provide a urine sample. The urine sample will be tested for bacteria and white blood cells. White blood cells are made by your body to help fight infection.  TREATMENT   Typically, UTIs can be treated with medication. Because most UTIs are caused by a bacterial infection, they usually can be treated with the use of antibiotics. The choice of antibiotic and length of treatment depend on your symptoms and the type of bacteria causing your infection.  HOME CARE INSTRUCTIONS   If you were prescribed antibiotics, take them exactly as your caregiver instructs you. Finish the medication even if you feel better after you  have only taken some of the medication.   Drink enough water and fluids to keep your urine clear or pale yellow.   Avoid caffeine, tea, and carbonated beverages. They tend to irritate your bladder.   Empty your bladder often. Avoid holding urine for long periods of time.   Empty your bladder before and after sexual intercourse.   After a bowel movement, women should cleanse from front to back. Use each tissue only once.  SEEK MEDICAL CARE IF:    You have back pain.   You develop a fever.   Your symptoms do not begin to resolve within 3 days.  SEEK IMMEDIATE MEDICAL CARE IF:    You have severe back pain or lower abdominal pain.   You develop chills.   You have nausea or vomiting.   You have continued burning or discomfort with urination.  MAKE SURE YOU:    Understand these instructions.   Will watch your condition.   Will get help right away if you are not doing well or get worse.  Document Released: 09/01/2005 Document Revised: 05/23/2012 Document Reviewed: 12/31/2011  [employer] Patient Information 2014 [employer].

## 2013-09-19 NOTE — Progress Notes (Signed)
Patient ID: Sheila Keller, female   DOB: 07-14-1946, 67 y.o.   MRN: [id number] Presents with UTI-like symptoms of urinary burning, urgency, frequency, and low back pain x 6 days.  Took OTC cranberry pills without relief.  History of UTIs.  Denies discharge, lower abdominal pain, fever, nausea/ vomiting.    Exam: Appears well.  No CVAT/ abdominal tenderness on palpation.  UA slightly cloudy with small leukocytes, 11-20 WBCs, 3-6 RBCs, few bacteria.   Urinary Tract Infection Diabetes/ HTN/ Hyperlipidemia - PCP manages  Plan: Ciprofloxacin 250 mg po BID x 3 days/ Uribel samples with instructions on usage.  UTI prevention reviewed, increase water intake.  Diabetic precautions reviewed/ decrease simple sugars and diet.  Call/ return if symptoms don't improve.

## 2013-09-21 LAB — URINE CULTURE: Colony Count: >=100000

## 2013-10-15 ENCOUNTER — Encounter: Payer: Self-pay | Admitting: Podiatry

## 2013-10-15 ENCOUNTER — Ambulatory Visit (INDEPENDENT_AMBULATORY_CARE_PROVIDER_SITE_OTHER): Payer: PRIVATE HEALTH INSURANCE | Admitting: Podiatry

## 2013-10-15 VITALS — BP 158/78 | HR 98 | Resp 12 | Ht 63.0 in | Wt 205.0 lb

## 2013-10-15 DIAGNOSIS — Q828 Other specified congenital malformations of skin: Secondary | ICD-10-CM

## 2013-10-15 DIAGNOSIS — E119 Type 2 diabetes mellitus without complications: Secondary | ICD-10-CM

## 2013-10-15 NOTE — Patient Instructions (Signed)
Return as needed for debridement of painful callus on the right foot.

## 2013-10-15 NOTE — Progress Notes (Signed)
  Subjective:    Patient ID: Sheila Keller, female    DOB: 1946/06/16, 67 y.o.   MRN: [id number]  HPI Comments: N-SORE, THICK L-RT FOOT 5TH METATARSAL D-10 YEARS O-SLOWLY C-WORSE A-PRESSURE T-TRIM   10 year history of painful plantar hyperkeratotic lesion right foot.   Review of Systems  Eyes: Positive for pain.  Hematological: Bruises/bleeds easily.  All other systems reviewed and are negative.       Objective:   Physical Exam Vascular: DP and PT pulses are two over four bilaterally. Capillary fill is immediate bilaterally.  Neurological: Knee and ankle reflexes equal and reactive bilaterally. Vibratory sensation intact bilaterally. Sensation to 10 g monofilament wire intact 10 over 10 locations bilaterally.  Dermatological: Texture and turgor within normal limits bilaterally. Well-organized hyperkeratotic tissue plantar fifth right MPJ.  Musculoskeletal: No restriction ankle subtalar midtarsal joints bilaterally. No deformities noted bilaterally.      Assessment & Plan:   Assessment: Diabetic without any complications Porokeratoses plantar right foot  Plan: Debrided the plantar keratoses right without any bleeding. Return as needed.

## 2014-03-13 ENCOUNTER — Encounter: Payer: Self-pay | Admitting: Gynecology

## 2014-03-13 ENCOUNTER — Ambulatory Visit (INDEPENDENT_AMBULATORY_CARE_PROVIDER_SITE_OTHER): Payer: PRIVATE HEALTH INSURANCE | Admitting: Gynecology

## 2014-03-13 VITALS — BP 140/90

## 2014-03-13 DIAGNOSIS — R300 Dysuria: Secondary | ICD-10-CM

## 2014-03-13 DIAGNOSIS — N898 Other specified noninflammatory disorders of vagina: Secondary | ICD-10-CM

## 2014-03-13 LAB — URINALYSIS W MICROSCOPIC + REFLEX CULTURE
Bilirubin Urine: NEGATIVE
GLUCOSE, UA: 250 mg/dL — AB
Hgb urine dipstick: NEGATIVE
Ketones, ur: NEGATIVE mg/dL
LEUKOCYTES UA: NEGATIVE
NITRITE: NEGATIVE
PROTEIN: NEGATIVE mg/dL
Specific Gravity, Urine: >1.03 — ABNORMAL HIGH (ref 1.005–1.030)
UROBILINOGEN UA: 0.2 mg/dL (ref 0.0–1.0)
pH: 5 (ref 5.0–8.0)

## 2014-03-13 LAB — WET PREP FOR TRICH, YEAST, CLUE
TRICH WET PREP: NONE SEEN
Yeast Wet Prep HPF POC: NONE SEEN

## 2014-03-13 MED ORDER — TINIDAZOLE 500 MG PO TABS: ORAL_TABLET | ORAL | Status: DC

## 2014-03-13 NOTE — Progress Notes (Signed)
   Patient is a 68 year old who presented to the office today complaining of several day history of a vaginal discharge with odor. She is in a stable relationship. She is a type II diabetic controlled by oral hypoglycemic agent and diet. She was having some burning with urination but her urinalysis was negative today and some mild back discomfort. She denied any fever, chills, nausea, or vomiting.  Exam: Back: No CVA tenderness Abdomen: Soft nontender no rebound or guarding Pelvic: And urethra Skene was within normal limits file clear discharge was noted wet prep obtained Cervix: Atrophic changes no lesions or discharge Bimanual exam: Not done Rectal exam: Not done  Urinalysis: Negative Wet prep: Positive AMINE, few clue cells, many bacteria, few WBC  Assessment/plan: Clinical evidence of bacterial vaginosis will be treated with Tindamax 500 mg 4 tablets today and then to repeat in 24 hours.

## 2014-03-13 NOTE — Patient Instructions (Signed)
Brompheniramine oral suspension What is this medicine? BROMPHENIRAMINE (brome fen IR a meen) is an antihistamine. It is used to treat the symptoms of seasonal and year-round allergies. It is also used to treat a runny nose from a cold. This medicine may be used for other purposes; ask your health care provider or pharmacist if you have questions. COMMON BRAND NAME(S): B-Vex, BroveX, J-Tan, Lodrane XR, TanaCof-XR What should I tell my health care provider before I take this medicine? They need to know if you have any of these conditions: -asthma -glaucoma -heart disease -high blood pressure -prostate disease -stomach ulcer -taking MAOIs like Carbex, Eldepryl, Marplan, Nardil, and Parnate -trouble passing urine -thyroid disease -an unusual or allergic reaction to brompheniramine, tartrazine, other medicines, foods, dyes, or preservatives -pregnant or trying to get pregnant -breast-feeding How should I use this medicine? Take this medicine by mouth. Follow the directions on the prescription label. Shake well before using. Use a specially marked spoon or container to measure each dose. Ask your pharmacist if you do not have one. Household spoons are not accurate. Take your medicine at regular intervals. Do not take your medicine more often than directed. Talk to your pediatrician regarding the use of this medicine in children. While this drug may be prescribed for children as young as 37 months old for selected conditions, precautions do apply. Overdosage: If you think you have taken too much of this medicine contact a poison control center or emergency room at once. NOTE: This medicine is only for you. Do not share this medicine with others. What if I miss a dose? If you miss a dose, take it as soon as you can. If it is almost time for your next dose, take only that dose. Do not take double or extra doses. What may interact with this medicine? Do not take this medicine with any of the  following medications: -MAOIs like Carbex, Eldepryl, Marplan, Nardil, and Parnate This medicine may also interact with the following medications: -alcohol -barbiturates, like phenobarbital -medicines for bladder spasm like oxybutynin, tolterodine -medicines for blood pressure -medicines for depression, anxiety, or psychotic disturbances -medicines for movement abnormalities or Parkinson's disease -medicines for sleep -other medicines for cold, cough or allergy -some medicines for the stomach like chlordiazepoxide, dicyclomine This list may not describe all possible interactions. Give your health care provider a list of all the medicines, herbs, non-prescription drugs, or dietary supplements you use. Also tell them if you smoke, drink alcohol, or use illegal drugs. Some items may interact with your medicine. What should I watch for while using this medicine? Visit your doctor or health care professional for regular check ups. Tell your doctor or healthcare professional if your symptoms do not start to get better or if they get worse. Your mouth may get dry. Chewing sugarless gum or sucking hard candy, and drinking plenty of water may help. Contact your doctor if the problem does not go away or is severe. This medicine may cause dry eyes and blurred vision. If you wear contact lenses you may feel some discomfort. Lubricating drops may help. See your eye doctor if the problem does not go away or is severe. You may get drowsy or dizzy. Do not drive, use machinery, or do anything that needs mental alertness until you know how this medicine affects you. Do not stand or sit up quickly, especially if you are an older patient. This reduces the risk of dizzy or fainting spells. Alcohol may interfere with the effect of this  medicine. Avoid alcoholic drinks. What side effects may I notice from receiving this medicine? Side effects that you should report to your doctor or health care professional as soon as  possible: -allergic reactions like skin rash, itching or hives, swelling of the face, lips, or tongue -breathing problems -changes in vision -chest pain -fast, irregular heartbeat -fear, anxiety, restless, tremor -fever, sore throat -hallucinations -high blood pressure -seizures -trouble passing urine -unusual bleeding or bruising -unusually weak or tired Side effects that usually do not require medical attention (report to your doctor or health care professional if they continue or are bothersome): -clumsy -dry mouth, nose, throat -flushed, red skin -headache -loss of appetite -stomach upset, nausea This list may not describe all possible side effects. Call your doctor for medical advice about side effects. You may report side effects to FDA at 1-800-FDA-1088. Where should I keep my medicine? Keep out of the reach of children. Store at room temperature between 15 and 30 degrees C (59 and 86 degrees F). Throw away any unused medicine after the expiration date. NOTE: This sheet is a summary. It may not cover all possible information. If you have questions about this medicine, talk to your doctor, pharmacist, or health care provider.  2014, Elsevier/Gold Standard. (2008-02-21 14:55:17)

## 2014-07-22 ENCOUNTER — Other Ambulatory Visit: Payer: Self-pay

## 2014-07-22 DIAGNOSIS — Z1231 Encounter for screening mammogram for malignant neoplasm of breast: Secondary | ICD-10-CM

## 2014-08-05 ENCOUNTER — Ambulatory Visit
Admission: RE | Admit: 2014-08-05 | Discharge: 2014-08-05 | Disposition: A | Discharge status: Home / Self Care | Payer: PRIVATE HEALTH INSURANCE | Source: Ambulatory Visit

## 2014-08-05 DIAGNOSIS — Z1231 Encounter for screening mammogram for malignant neoplasm of breast: Secondary | ICD-10-CM

## 2014-09-09 ENCOUNTER — Ambulatory Visit (INDEPENDENT_AMBULATORY_CARE_PROVIDER_SITE_OTHER): Payer: PRIVATE HEALTH INSURANCE | Admitting: Gynecology

## 2014-09-09 ENCOUNTER — Encounter: Payer: Self-pay | Admitting: Gynecology

## 2014-09-09 VITALS — BP 140/86

## 2014-09-09 DIAGNOSIS — N760 Acute vaginitis: Secondary | ICD-10-CM

## 2014-09-09 DIAGNOSIS — B9689 Other specified bacterial agents as the cause of diseases classified elsewhere: Secondary | ICD-10-CM

## 2014-09-09 DIAGNOSIS — A499 Bacterial infection, unspecified: Secondary | ICD-10-CM

## 2014-09-09 DIAGNOSIS — N898 Other specified noninflammatory disorders of vagina: Secondary | ICD-10-CM

## 2014-09-09 DIAGNOSIS — R350 Frequency of micturition: Secondary | ICD-10-CM

## 2014-09-09 LAB — URINALYSIS W MICROSCOPIC + REFLEX CULTURE
Casts: NONE SEEN
Glucose, UA: NEGATIVE mg/dL
HGB URINE DIPSTICK: NEGATIVE
Ketones, ur: 15 mg/dL — AB
NITRITE: NEGATIVE
PROTEIN: NEGATIVE mg/dL
RBC / HPF: NONE SEEN RBC/hpf (ref <3)
Specific Gravity, Urine: 1.025 (ref 1.005–1.030)
UROBILINOGEN UA: 0.2 mg/dL (ref 0.0–1.0)
pH: 5 (ref 5.0–8.0)

## 2014-09-09 LAB — WET PREP FOR TRICH, YEAST, CLUE
TRICH WET PREP: NONE SEEN
YEAST WET PREP: NONE SEEN

## 2014-09-09 MED ORDER — TINIDAZOLE 500 MG PO TABS: ORAL_TABLET | ORAL | Status: DC

## 2014-09-09 NOTE — Progress Notes (Signed)
   68 year old type II diabetic presented to the office today complaining of a yellow vaginal discharge with a fishy like odor. Patient denied any dysuria. No fever, chills, nausea, or vomiting. No back pain reported. Some suprapubic discomfort. Patient is currently on metformin 1000 mg twice a day for her type 2 diabetes.  Exam: Morbidly obese patient Abdomen: Soft nontender no rebound or guarding some slight suprapubic tenderness was present. Back: No CVA tenderness Pelvic: Bartholin urethra Skene glands within normal limits vagina: Atrophic mild changes were noted Bimanual exam limited although no gross large masses palpated but she was tender suprapubically Rectal exam: Not done  Urinalysis: White blood cells 7-10 WBC, bacteria few urinalysis sent for culture  Wet prep: Amine: pos, clue cells moderate, WBC many, bacteria too numerous to count  Assessment/plan: Clinical evidence bacterial vaginosis patient will be prescribed Tindamax 500 mg 4 tablets today and to repeat in 24 hours. We will await the results of the urine culture. Patient is overdue for her annual exam and will schedule in the next 2 weeks long within ultrasound because of her being  obese and having vaginismus.

## 2014-09-09 NOTE — Patient Instructions (Signed)
Tinidazole tablets What is this medicine? TINIDAZOLE (tye NI da zole) is an antiinfective. It is used to treat amebiasis, giardiasis, trichomoniasis, and vaginosis. It will not work for colds, flu, or other viral infections. This medicine may be used for other purposes; ask your health care provider or pharmacist if you have questions. COMMON BRAND NAME(S): Tindamax What should I tell my health care provider before I take this medicine? They need to know if you have any of these conditions: -anemia or other blood disorders -if you frequently drink alcohol containing drinks -receiving hemodialysis -seizure disorder -an unusual or allergic reaction to tinidazole, other medicines, foods, dyes, or preservatives -pregnant or trying to get pregnant -breast-feeding How should I use this medicine? Take this medicine by mouth with a full glass of water. Follow the directions on the prescription label. Take with food. Take your medicine at regular intervals. Do not take your medicine more often than directed. Take all of your medicine as directed even if you think you are better. Do not skip doses or stop your medicine early. Talk to your pediatrician regarding the use of this medicine in children. While this drug may be prescribed for children as young as 3 years of age for selected conditions, precautions do apply. Overdosage: If you think you have taken too much of this medicine contact a poison control center or emergency room at once. NOTE: This medicine is only for you. Do not share this medicine with others. What if I miss a dose? If you miss a dose, take it as soon as you can. If it is almost time for your next dose, take only that dose. Do not take double or extra doses. What may interact with this medicine? Do not take this medicine with any of the following medications: -alcohol or any product that contains alcohol -amprenavir oral solution -disulfiram -paclitaxel injection -ritonavir  oral solution -sertraline oral solution -sulfamethoxazole-trimethoprim injection This medicine may also interact with the following medications: -cholestyramine -cimetidine -conivaptan -cyclosporin -fluorouracil -fosphenytoin, phenytoin -ketoconazole -lithium -phenobarbital -tacrolimus -warfarin This list may not describe all possible interactions. Give your health care provider a list of all the medicines, herbs, non-prescription drugs, or dietary supplements you use. Also tell them if you smoke, drink alcohol, or use illegal drugs. Some items may interact with your medicine. What should I watch for while using this medicine? Tell your doctor or health care professional if your symptoms do not improve or if they get worse. Avoid alcoholic drinks while you are taking this medicine and for three days afterward. Alcohol may make you feel dizzy, sick, or flushed. If you are being treated for a sexually transmitted disease, avoid sexual contact until you have finished your treatment. Your sexual partner may also need treatment. What side effects may I notice from receiving this medicine? Side effects that you should report to your doctor or health care professional as soon as possible: -allergic reactions like skin rash, itching or hives, swelling of the face, lips, or tongue -breathing problems -confusion, depression -dark or white patches in the mouth -feeling faint or lightheaded, falls -fever, infection -numbness, tingling, pain or weakness in the hands or feet -pain when passing urine -seizures -unusually weak or tired -vaginal irritation or discharge -vomiting Side effects that usually do not require medical attention (report to your doctor or health care professional if they continue or are bothersome): -dark brown or reddish urine -diarrhea -headache -loss of appetite -metallic taste -nausea -stomach upset This list may not describe all  possible side effects. Call your  doctor for medical advice about side effects. You may report side effects to FDA at 1-800-FDA-1088. Where should I keep my medicine? Keep out of the reach of children. Store at room temperature between 15 and 30 degrees C (59 and 86 degrees F). Protect from light and moisture. Keep container tightly closed. Throw away any unused medicine after the expiration date. NOTE: This sheet is a summary. It may not cover all possible information. If you have questions about this medicine, talk to your doctor, pharmacist, or health care provider.  2015, Elsevier/Gold Standard. (2008-08-19 15:22:28)

## 2014-09-10 ENCOUNTER — Ambulatory Visit: Payer: PRIVATE HEALTH INSURANCE | Admitting: Gynecology

## 2014-09-11 LAB — URINE CULTURE

## 2014-09-23 ENCOUNTER — Other Ambulatory Visit: Payer: Self-pay | Admitting: Gynecology

## 2014-09-23 DIAGNOSIS — N942 Vaginismus: Secondary | ICD-10-CM

## 2014-09-23 DIAGNOSIS — E669 Obesity, unspecified: Secondary | ICD-10-CM

## 2014-09-30 ENCOUNTER — Other Ambulatory Visit: Payer: Self-pay | Admitting: Gynecology

## 2014-09-30 ENCOUNTER — Ambulatory Visit (INDEPENDENT_AMBULATORY_CARE_PROVIDER_SITE_OTHER): Payer: PRIVATE HEALTH INSURANCE | Admitting: Gynecology

## 2014-09-30 ENCOUNTER — Other Ambulatory Visit: Payer: PRIVATE HEALTH INSURANCE

## 2014-09-30 ENCOUNTER — Encounter: Payer: PRIVATE HEALTH INSURANCE | Admitting: Gynecology

## 2014-09-30 ENCOUNTER — Ambulatory Visit (INDEPENDENT_AMBULATORY_CARE_PROVIDER_SITE_OTHER): Payer: PRIVATE HEALTH INSURANCE

## 2014-09-30 DIAGNOSIS — Z803 Family history of malignant neoplasm of breast: Secondary | ICD-10-CM

## 2014-09-30 DIAGNOSIS — Z8041 Family history of malignant neoplasm of ovary: Secondary | ICD-10-CM

## 2014-09-30 DIAGNOSIS — E669 Obesity, unspecified: Secondary | ICD-10-CM

## 2014-09-30 DIAGNOSIS — N8331 Acquired atrophy of ovary: Secondary | ICD-10-CM

## 2014-09-30 DIAGNOSIS — D251 Intramural leiomyoma of uterus: Secondary | ICD-10-CM

## 2014-09-30 DIAGNOSIS — N942 Vaginismus: Secondary | ICD-10-CM

## 2014-09-30 DIAGNOSIS — N83319 Acquired atrophy of ovary, unspecified side: Secondary | ICD-10-CM

## 2014-09-30 NOTE — Progress Notes (Signed)
   Patient presented to the office today to discuss her ultrasound. Since her annual exam in July 2014 she had been encouraged to return to the office for an ultrasound because of her past history of fibroid uterus and her being morbidly obese limiting the pelvic exam. She is asymptomatic. Ultrasound reported as follows:  Uterus measures 6.4 x 4.9 x 5.2 cm with endometrial stripe of 2.6 mm. Patient had 3 intramural fibroids largest 1 measuring 28 x 25 x 30 mm. Endometrial stripe was thickened right ovary not seen left ovary atrophic. No fluid in the cul-de-sac. Adnexa was normal. Patient's last ultrasound was 7 years ago it appears the fibroids have gotten smaller.  Patient scheduled to return back to the office next year for her annual exam. Her PCP we'll be drawing her blood work.

## 2014-10-07 ENCOUNTER — Encounter: Payer: Self-pay | Admitting: Gynecology

## 2015-07-04 ENCOUNTER — Other Ambulatory Visit: Payer: Self-pay

## 2015-07-04 DIAGNOSIS — Z1231 Encounter for screening mammogram for malignant neoplasm of breast: Secondary | ICD-10-CM

## 2015-08-15 ENCOUNTER — Ambulatory Visit
Admission: RE | Admit: 2015-08-15 | Discharge: 2015-08-15 | Disposition: A | Discharge status: Home / Self Care | Payer: PRIVATE HEALTH INSURANCE | Source: Ambulatory Visit

## 2015-08-15 DIAGNOSIS — Z1231 Encounter for screening mammogram for malignant neoplasm of breast: Secondary | ICD-10-CM

## 2015-11-10 ENCOUNTER — Encounter: Payer: PRIVATE HEALTH INSURANCE | Admitting: Gynecology

## 2016-01-14 ENCOUNTER — Other Ambulatory Visit (HOSPITAL_COMMUNITY)
Admission: RE | Admit: 2016-01-14 | Discharge: 2016-01-14 | Disposition: A | Discharge status: Home / Self Care | Payer: PRIVATE HEALTH INSURANCE | Source: Ambulatory Visit | Attending: Gynecology | Admitting: Gynecology

## 2016-01-14 ENCOUNTER — Ambulatory Visit (INDEPENDENT_AMBULATORY_CARE_PROVIDER_SITE_OTHER): Payer: PRIVATE HEALTH INSURANCE | Admitting: Gynecology

## 2016-01-14 ENCOUNTER — Encounter: Payer: Self-pay | Admitting: Gynecology

## 2016-01-14 VITALS — BP 136/88 | Ht 63.0 in | Wt 190.0 lb

## 2016-01-14 DIAGNOSIS — Z87410 Personal history of cervical dysplasia: Secondary | ICD-10-CM

## 2016-01-14 DIAGNOSIS — D251 Intramural leiomyoma of uterus: Secondary | ICD-10-CM | POA: Diagnosis not present

## 2016-01-14 DIAGNOSIS — Z8639 Personal history of other endocrine, nutritional and metabolic disease: Secondary | ICD-10-CM | POA: Diagnosis not present

## 2016-01-14 DIAGNOSIS — E349 Endocrine disorder, unspecified: Secondary | ICD-10-CM

## 2016-01-14 DIAGNOSIS — Z124 Encounter for screening for malignant neoplasm of cervix: Secondary | ICD-10-CM | POA: Diagnosis not present

## 2016-01-14 DIAGNOSIS — M8580 Other specified disorders of bone density and structure, unspecified site: Secondary | ICD-10-CM

## 2016-01-14 DIAGNOSIS — Z01411 Encounter for gynecological examination (general) (routine) with abnormal findings: Secondary | ICD-10-CM | POA: Insufficient documentation

## 2016-01-14 DIAGNOSIS — Z1151 Encounter for screening for human papillomavirus (HPV): Secondary | ICD-10-CM | POA: Diagnosis present

## 2016-01-14 DIAGNOSIS — Z01419 Encounter for gynecological examination (general) (routine) without abnormal findings: Secondary | ICD-10-CM

## 2016-01-14 NOTE — Progress Notes (Signed)
Sheila Keller 12/24/1945 [id number]   History:    70 y.o.  for annual gyn exam with no complaints today. Last year as a result of patient's vaginismus an abdominal girth and ultrasound had been done whereby the following was reported:  "Uterus measures 6.4 x 4.9 x 5.2 cm with endometrial stripe of 2.6 mm. Patient had 3 intramural fibroids largest 1 measuring 28 x 25 x 30 mm. Endometrial stripe was thickened right ovary not seen left ovary atrophic. No fluid in the cul-de-sac. Adnexa was normal. Patient's last ultrasound was 7 years ago it appears the fibroids have gotten smaller."  In 2014 patient was diagnosed with vitamin D deficiency is currently now taking vitamin D 2000 units daily. Patient's last bone density study was in 2014. Her lowest T score was -1.3 at the right femoral neck and she had a normal Frax analysis. There was statistically significant decrease of the AP spine bone mineralization of -7% and the right and left femoral necks with no statistically significant decrease in bone mineralization. Patient had a colonoscopy in 2011 she was told that with benign polyp but she did not need to follow-up colonoscopy for 10 years. Her PCP is doing her fecal Hemoccult testing yearly.  Her primary physician is Dr.Reid who has been treating her for her hyperlipidemia and type 2 diabetes and has been doing most of her lab work. Patient has continued to gain weight. Patient has history of LEEP cervical conization in 2009 involving the endocervical canal and her followup Pap smears have been negative since then. She denies any vaginal bleeding or any vaginal irritation. She is on no hormone replacement therapy. She is on Evista 60 mg daily for her osteopenia that her primary physician placed her on. Patient has strong family history of breast cancer or by her mother was diagnosed with it at age 11. Patient had been offered BRCA1 and BRCA2 testing counseling at the [facility] but has  not gotten around to do so. Patient states that her Tdap, Pneumovax, and she was vaccine are up-to-date.  Review of patient's records indicated that back in 2014 her PTH was elevated 96.4  Past medical history,surgical history, family history and social history were all reviewed and documented in the EPIC chart.  Gynecologic History No LMP recorded. Patient is postmenopausal. Contraception: post menopausal status Last Pap: 2014. Results were: normal Last mammogram: 2016. Results were: normal  Obstetric History OB History  Gravida Para Term Preterm AB SAB TAB Ectopic Multiple Living  2 2 2       2     # Outcome Date GA Lbr Len/2nd Weight Sex Delivery Anes PTL Lv  2 Term           1 Term                ROS: A ROS was performed and pertinent positives and negatives are included in the history.  GENERAL: No fevers or chills. HEENT: No change in vision, no earache, sore throat or sinus congestion. NECK: No pain or stiffness. CARDIOVASCULAR: No chest pain or pressure. No palpitations. PULMONARY: No shortness of breath, cough or wheeze. GASTROINTESTINAL: No abdominal pain, nausea, vomiting or diarrhea, melena or bright red blood per rectum. GENITOURINARY: No urinary frequency, urgency, hesitancy or dysuria. MUSCULOSKELETAL: No joint or muscle pain, no back pain, no recent trauma. DERMATOLOGIC: No rash, no itching, no lesions. ENDOCRINE: No polyuria, polydipsia, no heat or cold intolerance. No recent change in weight. HEMATOLOGICAL: No  anemia or easy bruising or bleeding. NEUROLOGIC: No headache, seizures, numbness, tingling or weakness. PSYCHIATRIC: No depression, no loss of interest in normal activity or change in sleep pattern.     Exam: chaperone present  BP 136/88 mmHg  Ht 5' 3"  (1.6 m)  Wt 190 lb (86.183 kg)  BMI 33.67 kg/m2  Body mass index is 33.67 kg/(m^2).  General appearance : Well developed well nourished female. No acute distress HEENT: Eyes: no retinal hemorrhage or  exudates,  Neck supple, trachea midline, no carotid bruits, no thyroidmegaly Lungs: Clear to auscultation, no rhonchi or wheezes, or rib retractions  Heart: Regular rate and rhythm, no murmurs or gallops Breast:Examined in sitting and supine position were symmetrical in appearance, no palpable masses or tenderness,  no skin retraction, no nipple inversion, no nipple discharge, no skin discoloration, no axillary or supraclavicular lymphadenopathy Abdomen: no palpable masses or tenderness, no rebound or guarding Extremities: no edema or skin discoloration or tenderness  Pelvic:  Bartholin, Urethra, Skene Glands: Within normal limits             Vagina: No gross lesions or discharge atrophic changes  Cervix: No gross lesions or discharge  Uterus  anteverted slightly irregular shaped at the fundus,, non-tender and mobile  Adnexa  Without masses or tenderness  Anus and perineum  normal   Rectovaginal  normal sphincter tone without palpated masses or tenderness             Hemoccult PCP provides     Assessment/Plan:  69 y.o. female for annual exam will have the following blood were drawn today: Calcium, vitamin D, and PTH (past history of vitamin D deficiency and elevated PTH). Because of her vaginismus and history of fibroid uterus she'll return back next week for pelvic ultrasound for better assessment of her female reproductive organs. Also she will return back the following months for her overdue bone density study. We discussed importance of calcium vitamin D and regular exercise for osteoporosis prevention. Her PCP we'll be doing her blood work. Pap smear with HPV screening was done today.   Terrance Mass MD, 9:39 AM 01/14/2016

## 2016-01-14 NOTE — Addendum Note (Signed)
Addended by: Thurnell Garbe A on: 01/14/2016 09:49 AM   Modules accepted: Orders

## 2016-01-15 LAB — CYTOLOGY - PAP

## 2016-01-15 LAB — VITAMIN D 25 HYDROXY (VIT D DEFICIENCY, FRACTURES): VIT D 25 HYDROXY: 40 ng/mL (ref 30–100)

## 2016-01-15 LAB — PTH, INTACT AND CALCIUM
CALCIUM: 9.2 mg/dL (ref 8.4–10.5)
PTH: 46 pg/mL (ref 14–64)

## 2016-01-27 ENCOUNTER — Ambulatory Visit
Admission: RE | Admit: 2016-01-27 | Discharge: 2016-01-27 | Disposition: A | Discharge status: Home / Self Care | Payer: PRIVATE HEALTH INSURANCE | Source: Ambulatory Visit | Attending: Family Medicine | Admitting: Family Medicine

## 2016-01-27 ENCOUNTER — Other Ambulatory Visit: Payer: Self-pay | Admitting: Family Medicine

## 2016-01-27 DIAGNOSIS — S63610A Unspecified sprain of right index finger, initial encounter: Secondary | ICD-10-CM

## 2016-01-29 ENCOUNTER — Ambulatory Visit (INDEPENDENT_AMBULATORY_CARE_PROVIDER_SITE_OTHER): Payer: PRIVATE HEALTH INSURANCE

## 2016-01-29 ENCOUNTER — Other Ambulatory Visit: Payer: Self-pay | Admitting: Gynecology

## 2016-01-29 DIAGNOSIS — M8580 Other specified disorders of bone density and structure, unspecified site: Secondary | ICD-10-CM

## 2016-01-29 DIAGNOSIS — M899 Disorder of bone, unspecified: Secondary | ICD-10-CM | POA: Diagnosis not present

## 2016-02-24 ENCOUNTER — Encounter: Payer: Self-pay | Admitting: Women's Health

## 2016-02-24 ENCOUNTER — Ambulatory Visit (INDEPENDENT_AMBULATORY_CARE_PROVIDER_SITE_OTHER): Payer: PRIVATE HEALTH INSURANCE | Admitting: Women's Health

## 2016-02-24 VITALS — BP 126/84

## 2016-02-24 DIAGNOSIS — B3731 Acute candidiasis of vulva and vagina: Secondary | ICD-10-CM

## 2016-02-24 DIAGNOSIS — N898 Other specified noninflammatory disorders of vagina: Secondary | ICD-10-CM

## 2016-02-24 DIAGNOSIS — N390 Urinary tract infection, site not specified: Secondary | ICD-10-CM

## 2016-02-24 DIAGNOSIS — B373 Candidiasis of vulva and vagina: Secondary | ICD-10-CM | POA: Diagnosis not present

## 2016-02-24 DIAGNOSIS — R350 Frequency of micturition: Secondary | ICD-10-CM | POA: Diagnosis not present

## 2016-02-24 DIAGNOSIS — Z113 Encounter for screening for infections with a predominantly sexual mode of transmission: Secondary | ICD-10-CM | POA: Diagnosis not present

## 2016-02-24 LAB — WET PREP FOR TRICH, YEAST, CLUE
CLUE CELLS WET PREP: NONE SEEN
TRICH WET PREP: NONE SEEN

## 2016-02-24 LAB — URINALYSIS W MICROSCOPIC + REFLEX CULTURE
BILIRUBIN URINE: NEGATIVE
CASTS: NONE SEEN [LPF]
CRYSTALS: NONE SEEN [HPF]
NITRITE: NEGATIVE
Specific Gravity, Urine: 1.025 (ref 1.001–1.035)
WBC, UA: >60 WBC/HPF — AB (ref <=5)
YEAST: NONE SEEN [HPF]
pH: 5 (ref 5.0–8.0)

## 2016-02-24 MED ORDER — METRONIDAZOLE 500 MG PO TABS: ORAL_TABLET | ORAL | Status: DC

## 2016-02-24 MED ORDER — FLUCONAZOLE 100 MG PO TABS: ORAL_TABLET | ORAL | Status: DC

## 2016-02-24 NOTE — Progress Notes (Signed)
Patient ID: Sheila Keller, female   DOB: [birth year], 70 y.o.   MRN: [id number] Presents with complaint of increased vaginal watery discharge, irritation and urinary frequency. Denies abdominal pain, fever or pain at end of urination. Sexually active with new partner. Type 2 diabetes.  Exam: Appears well. External genitalia extremely erythematous, speculum exam scant discharge, mild erythema, no odor, wet prep negative, GC/Chlamydia culture taken. Bimanual no CMT or adnexal tenderness. UA: +1 leukocytes, greater than 60 WBCs, 10-20 rbc's, many bacteria, positive for trichomoniasis.  Trichomonas STD screen  Plan: Flagyl 2 g by mouth prescription, refill given for partner, reviewed alcohol precautions. GC/Chlamydia culture pending, HIV, hep B, C, RPR. Reviewed importance of condoms until permanent partner.

## 2016-02-24 NOTE — Patient Instructions (Signed)

## 2016-02-25 LAB — HEPATITIS B SURFACE ANTIGEN: HEP B S AG: NEGATIVE

## 2016-02-25 LAB — RPR

## 2016-02-25 LAB — HEPATITIS C ANTIBODY: HCV Ab: NEGATIVE

## 2016-02-25 LAB — GC/CHLAMYDIA PROBE AMP
CT Probe RNA: NOT DETECTED
GC Probe RNA: NOT DETECTED

## 2016-02-25 LAB — HIV ANTIBODY (ROUTINE TESTING W REFLEX): HIV 1&2 Ab, 4th Generation: NONREACTIVE

## 2016-02-26 LAB — URINE CULTURE

## 2016-03-03 ENCOUNTER — Other Ambulatory Visit: Payer: PRIVATE HEALTH INSURANCE

## 2016-03-03 ENCOUNTER — Ambulatory Visit: Payer: PRIVATE HEALTH INSURANCE | Admitting: Gynecology

## 2016-03-03 ENCOUNTER — Ambulatory Visit: Payer: PRIVATE HEALTH INSURANCE | Admitting: Women's Health

## 2016-04-15 ENCOUNTER — Other Ambulatory Visit: Payer: Self-pay | Admitting: Family Medicine

## 2016-04-15 DIAGNOSIS — N63 Unspecified lump in unspecified breast: Secondary | ICD-10-CM

## 2016-04-16 ENCOUNTER — Other Ambulatory Visit: Payer: Self-pay | Admitting: Family Medicine

## 2016-04-16 ENCOUNTER — Ambulatory Visit
Admission: RE | Admit: 2016-04-16 | Discharge: 2016-04-16 | Disposition: A | Discharge status: Home / Self Care | Payer: PRIVATE HEALTH INSURANCE | Source: Ambulatory Visit | Attending: Family Medicine | Admitting: Family Medicine

## 2016-04-16 DIAGNOSIS — N63 Unspecified lump in unspecified breast: Secondary | ICD-10-CM

## 2016-05-12 ENCOUNTER — Ambulatory Visit (INDEPENDENT_AMBULATORY_CARE_PROVIDER_SITE_OTHER): Payer: PRIVATE HEALTH INSURANCE

## 2016-05-12 ENCOUNTER — Encounter: Payer: Self-pay | Admitting: Gynecology

## 2016-05-12 ENCOUNTER — Ambulatory Visit (INDEPENDENT_AMBULATORY_CARE_PROVIDER_SITE_OTHER): Payer: PRIVATE HEALTH INSURANCE | Admitting: Gynecology

## 2016-05-12 VITALS — BP 132/78 | Ht 63.0 in | Wt 190.0 lb

## 2016-05-12 DIAGNOSIS — D251 Intramural leiomyoma of uterus: Secondary | ICD-10-CM | POA: Diagnosis not present

## 2016-05-12 NOTE — Progress Notes (Signed)
   HPI: Patient is a 70 year old was seen in the office for her annual exam on February 8 of this year. Due to patient's abdominal girth since she has a BMI of 33.67 and her vaginismus makes it difficult for complete pelvic exam and this is the reason why we do an annual ultrasound on her. Her ultrasound report from 2016 as follows:  Uterus measures 6.4 x 4.9 x 5.2 cm with endometrial stripe of 2.6 mm. Patient had 3 intramural fibroids largest 1 measuring 28 x 25 x 30 mm. Endometrial stripe was thickened right ovary not seen left ovary atrophic. No fluid in the cul-de-sac. Adnexa was normal. Patient's last ultrasound was 7 years ago it appears the fibroids have gotten smaller."  Patient is otherwise asymptomatic   ROS: A ROS was performed and pertinent positives and negatives are included in the history.  GENERAL: No fevers or chills. HEENT: No change in vision, no earache, sore throat or sinus congestion. NECK: No pain or stiffness. CARDIOVASCULAR: No chest pain or pressure. No palpitations. PULMONARY: No shortness of breath, cough or wheeze. GASTROINTESTINAL: No abdominal pain, nausea, vomiting or diarrhea, melena or bright red blood per rectum. GENITOURINARY: No urinary frequency, urgency, hesitancy or dysuria. MUSCULOSKELETAL: No joint or muscle pain, no back pain, no recent trauma. DERMATOLOGIC: No rash, no itching, no lesions. ENDOCRINE: No polyuria, polydipsia, no heat or cold intolerance. No recent change in weight. HEMATOLOGICAL: No anemia or easy bruising or bleeding. NEUROLOGIC: No headache, seizures, numbness, tingling or weakness. PSYCHIATRIC: No depression, no loss of interest in normal activity or change in sleep pattern.   PE: See complete exam note 01/14/2016  Ultrasound today: Uterus measures 6.6 x 5.5 x 4.5 cm with endometrial stripe of 2 mm. Patient with 4 small intramural myomas the largest one measuring 31 x 27 mm. Right and left ovary not seen. No adnexal  masses.    Assessment Plan: Postmenopausal patient was stable intramural myomas. Patient scheduled to return back next year for annual exam or when necessary.    Greater than 50% of time was spent in counseling and coordinating care of this patient.   Time of consultation: 10   Minutes.

## 2016-07-08 ENCOUNTER — Other Ambulatory Visit: Payer: Self-pay | Admitting: Family Medicine

## 2016-07-08 DIAGNOSIS — Z1231 Encounter for screening mammogram for malignant neoplasm of breast: Secondary | ICD-10-CM

## 2016-08-23 ENCOUNTER — Ambulatory Visit
Admission: RE | Admit: 2016-08-23 | Discharge: 2016-08-23 | Disposition: A | Discharge status: Home / Self Care | Payer: PRIVATE HEALTH INSURANCE | Source: Ambulatory Visit | Attending: Family Medicine | Admitting: Family Medicine

## 2016-08-23 DIAGNOSIS — Z1231 Encounter for screening mammogram for malignant neoplasm of breast: Secondary | ICD-10-CM

## 2016-08-26 ENCOUNTER — Other Ambulatory Visit: Payer: Self-pay | Admitting: Family Medicine

## 2016-08-26 DIAGNOSIS — R928 Other abnormal and inconclusive findings on diagnostic imaging of breast: Secondary | ICD-10-CM

## 2016-09-02 ENCOUNTER — Ambulatory Visit
Admission: RE | Admit: 2016-09-02 | Discharge: 2016-09-02 | Disposition: A | Discharge status: Home / Self Care | Payer: PRIVATE HEALTH INSURANCE | Source: Ambulatory Visit | Attending: Family Medicine | Admitting: Family Medicine

## 2016-09-02 ENCOUNTER — Other Ambulatory Visit: Payer: Self-pay | Admitting: Physician Assistant

## 2016-09-02 DIAGNOSIS — R928 Other abnormal and inconclusive findings on diagnostic imaging of breast: Secondary | ICD-10-CM

## 2016-09-06 ENCOUNTER — Ambulatory Visit
Admission: RE | Admit: 2016-09-06 | Discharge: 2016-09-06 | Disposition: A | Discharge status: Home / Self Care | Payer: PRIVATE HEALTH INSURANCE | Source: Ambulatory Visit | Attending: Physician Assistant | Admitting: Physician Assistant

## 2016-09-06 DIAGNOSIS — R928 Other abnormal and inconclusive findings on diagnostic imaging of breast: Secondary | ICD-10-CM

## 2016-11-08 ENCOUNTER — Ambulatory Visit (INDEPENDENT_AMBULATORY_CARE_PROVIDER_SITE_OTHER): Payer: PRIVATE HEALTH INSURANCE | Admitting: Otolaryngology

## 2016-11-08 DIAGNOSIS — H6123 Impacted cerumen, bilateral: Secondary | ICD-10-CM

## 2017-02-09 ENCOUNTER — Encounter: Payer: Self-pay | Admitting: Gynecology

## 2017-02-09 ENCOUNTER — Ambulatory Visit (INDEPENDENT_AMBULATORY_CARE_PROVIDER_SITE_OTHER): Payer: PRIVATE HEALTH INSURANCE | Admitting: Gynecology

## 2017-02-09 VITALS — BP 128/78 | Ht 63.0 in | Wt 180.0 lb

## 2017-02-09 DIAGNOSIS — Z01419 Encounter for gynecological examination (general) (routine) without abnormal findings: Secondary | ICD-10-CM | POA: Diagnosis not present

## 2017-02-09 NOTE — Progress Notes (Signed)
Sheila Keller Oct 05, 1946 [id number]   History:    71 y.o.  for annual gyn exam with no complaints today.In 2014 patient was diagnosed with vitamin D deficiency is currently now taking vitamin D 2000 units daily. Patient's last bone density study was in 2014. Her lowest T score was -1.3 at the right femoral neck and she had a normal Frax analysis. There was statistically significant decrease of the AP spine bone mineralization of -7% and the right and left femoral necks with no statistically significant decrease in bone mineralization. Her bone density in 2017 was normal with a statistically significant improvement in the right and left femoral neck when compared with previous study of 2014. She will be instructed to discontinue the Evista that her PCP had placed her on in the past.  Patient had a colonoscopy in 2011 she was told that with benign polyp but she did not need to follow-up colonoscopy for 10 years. Her PCP is doing her fecal Hemoccult testing yearly.  Her primary physician is Dr.Reid who has been treating her for her hyperlipidemia and type 2 diabetes and has been doing most of her lab work. Patient has continued to gain weight. Patient has history of LEEP cervical conization in 2009 involving the endocervical canal and her followup Pap smears have been negative since then. She denies any vaginal bleeding or any vaginal irritation. She is on no hormone replacement therapy.Patient has strong family history of breast cancer or by her mother was diagnosed with it at age 45. Patient had been offered BRCA1 and BRCA2 testing counseling at the [facility] but has not gotten around to do so. Patient states that her Tdap, Pneumovax, and she was vaccine are up-to-date including flu vaccine.    Past medical history,surgical history, family history and social history were all reviewed and documented in the EPIC chart.  Gynecologic History No LMP recorded. Patient is  postmenopausal. Contraception: post menopausal status Last Pap: 2017. Results were: normal Last mammogram: 2017. Results were: Patient has stereotactic biopsy of the left breast fibroadenoma with calcification was reported.  Obstetric History OB History  Gravida Para Term Preterm AB Living  _0 SAB TAB Ectopic Multiple Live Births               # Outcome Date GA Lbr Len/2nd Weight Sex Delivery Anes PTL Lv  2 Term           1 Term                ROS: A ROS was performed and pertinent positives and negatives are included in the history.  GENERAL: No fevers or chills. HEENT: No change in vision, no earache, sore throat or sinus congestion. NECK: No pain or stiffness. CARDIOVASCULAR: No chest pain or pressure. No palpitations. PULMONARY: No shortness of breath, cough or wheeze. GASTROINTESTINAL: No abdominal pain, nausea, vomiting or diarrhea, melena or bright red blood per rectum. GENITOURINARY: No urinary frequency, urgency, hesitancy or dysuria. MUSCULOSKELETAL: No joint or muscle pain, no back pain, no recent trauma. DERMATOLOGIC: No rash, no itching, no lesions. ENDOCRINE: No polyuria, polydipsia, no heat or cold intolerance. No recent change in weight. HEMATOLOGICAL: No anemia or easy bruising or bleeding. NEUROLOGIC: No headache, seizures, numbness, tingling or weakness. PSYCHIATRIC: No depression, no loss of interest in normal activity or change in sleep pattern.     Exam: chaperone present  BP 128/78   Ht _1  (  1.6 m)   Wt 180 lb (81.6 kg)   BMI 31.89 kg/m   Body mass index is 31.89 kg/m.  General appearance : Well developed well nourished female. No acute distress HEENT: Eyes: no retinal hemorrhage or exudates,  Neck supple, trachea midline, no carotid bruits, no thyroidmegaly Lungs: Clear to auscultation, no rhonchi or wheezes, or rib retractions  Heart: Regular rate and rhythm, no murmurs or gallops Breast:Examined in sitting and supine position were  symmetrical in appearance, no palpable masses or tenderness,  no skin retraction, no nipple inversion, no nipple discharge, no skin discoloration, no axillary or supraclavicular lymphadenopathy Abdomen: no palpable masses or tenderness, no rebound or guarding Extremities: no edema or skin discoloration or tenderness  Pelvic:  Bartholin, Urethra, Skene Glands: Within normal limits             Vagina: No gross lesions or discharge  Cervix: No gross lesions or discharge  Uterus  anteverted, normal size, shape and consistency, non-tender and mobile  Adnexa  Without masses or tenderness  Anus and perineum  normal   Rectovaginal  normal sphincter tone without palpated masses or tenderness             Hemoccult PCP provides     Assessment/Plan:  71 y.o. female for annual exam had Pap smear due to the fact that many years ago she had a LEEP [facility] with positive margin for dysplasia. PCP is been doing her blood work her vaccines are up-to-date. I've instructed her to decrease her vitamin D which she was taking 5000 units daily and bring it down to 2000 daily. We discussed importance of weightbearing exercises for osteoporosis prevention. She will need a bone density study next year.   Terrance Mass MD, 3:02 PM 02/09/2017

## 2017-02-09 NOTE — Addendum Note (Signed)
Addended by: Burnett Kanaris on: 02/09/2017 03:10 PM   Modules accepted: Orders

## 2017-02-10 LAB — PAP IG (IMAGE GUIDED)

## 2017-03-15 ENCOUNTER — Emergency Department (HOSPITAL_COMMUNITY): Payer: PRIVATE HEALTH INSURANCE

## 2017-03-15 ENCOUNTER — Encounter (HOSPITAL_COMMUNITY): Payer: Self-pay | Admitting: Cardiology

## 2017-03-15 ENCOUNTER — Emergency Department (HOSPITAL_COMMUNITY)
Admission: EM | Admit: 2017-03-15 | Discharge: 2017-03-15 | Disposition: A | Discharge status: Home / Self Care | Payer: PRIVATE HEALTH INSURANCE | Attending: Emergency Medicine | Admitting: Emergency Medicine

## 2017-03-15 DIAGNOSIS — R42 Dizziness and giddiness: Secondary | ICD-10-CM | POA: Diagnosis not present

## 2017-03-15 DIAGNOSIS — Z7984 Long term (current) use of oral hypoglycemic drugs: Secondary | ICD-10-CM | POA: Diagnosis not present

## 2017-03-15 DIAGNOSIS — I10 Essential (primary) hypertension: Secondary | ICD-10-CM | POA: Insufficient documentation

## 2017-03-15 DIAGNOSIS — Z79899 Other long term (current) drug therapy: Secondary | ICD-10-CM | POA: Insufficient documentation

## 2017-03-15 DIAGNOSIS — Z7982 Long term (current) use of aspirin: Secondary | ICD-10-CM | POA: Diagnosis not present

## 2017-03-15 DIAGNOSIS — E119 Type 2 diabetes mellitus without complications: Secondary | ICD-10-CM | POA: Insufficient documentation

## 2017-03-15 LAB — BASIC METABOLIC PANEL
Anion gap: 12 (ref 5–15)
BUN: 18 mg/dL (ref 6–20)
CALCIUM: 9.6 mg/dL (ref 8.9–10.3)
CO2: 23 mmol/L (ref 22–32)
Chloride: 103 mmol/L (ref 101–111)
Creatinine, Ser: 1.01 mg/dL — ABNORMAL HIGH (ref 0.44–1.00)
GFR calc Af Amer: >60 mL/min (ref >60)
GFR, EST NON AFRICAN AMERICAN: 55 mL/min — AB (ref >60)
Glucose, Bld: 187 mg/dL — ABNORMAL HIGH (ref 65–99)
Potassium: 3.7 mmol/L (ref 3.5–5.1)
Sodium: 138 mmol/L (ref 135–145)

## 2017-03-15 LAB — URINALYSIS, ROUTINE W REFLEX MICROSCOPIC
Bilirubin Urine: NEGATIVE
Glucose, UA: 50 mg/dL — AB
Hgb urine dipstick: NEGATIVE
KETONES UR: NEGATIVE mg/dL
LEUKOCYTES UA: NEGATIVE
NITRITE: NEGATIVE
PROTEIN: NEGATIVE mg/dL
Specific Gravity, Urine: 1.015 (ref 1.005–1.030)
pH: 5 (ref 5.0–8.0)

## 2017-03-15 LAB — CBC
HCT: 39.4 % (ref 36.0–46.0)
HEMOGLOBIN: 13 g/dL (ref 12.0–15.0)
MCH: 30.5 pg (ref 26.0–34.0)
MCHC: 33 g/dL (ref 30.0–36.0)
MCV: 92.5 fL (ref 78.0–100.0)
Platelets: 226 10*3/uL (ref 150–400)
RBC: 4.26 MIL/uL (ref 3.87–5.11)
RDW: 12.5 % (ref 11.5–15.5)
WBC: 5.8 10*3/uL (ref 4.0–10.5)

## 2017-03-15 LAB — I-STAT TROPONIN, ED: Troponin i, poc: 0 ng/mL (ref 0.00–0.08)

## 2017-03-15 LAB — CBG MONITORING, ED
GLUCOSE-CAPILLARY: 186 mg/dL — AB (ref 65–99)
GLUCOSE-CAPILLARY: 134 mg/dL — AB (ref 65–99)

## 2017-03-15 LAB — TROPONIN I

## 2017-03-15 MED ORDER — SODIUM CHLORIDE 0.9 % IV BOLUS (SEPSIS)
500.0000 mL | Freq: Once | INTRAVENOUS | Status: AC
  Administered 2017-03-15: 500 mL via INTRAVENOUS

## 2017-03-15 NOTE — ED Triage Notes (Signed)
Seen at [facility] today for dizziness.     Pt had an abnormality with the EKG and EMS was called.  Pt was hypertensive.  201/92 with EMS.  Continues to have dizziness. No pain.

## 2017-03-15 NOTE — ED Provider Notes (Signed)
Emergency Department Provider Note   I have reviewed the triage vital signs and the nursing notes. By signing my name below, I, Sheila Keller, attest that this documentation has been prepared under the direction and in the presence of Sheila Fast, MD. Electronically Signed: Macon Keller, ED Scribe. 03/15/17. 8:27 PM.   HISTORY  Chief Complaint Dizziness   HPI Comments: Sheila Keller is a 71 y.o. female with PMHx of DM, HTN, elevated cholesterol brought in by ambulance who presents to the Emergency Department complaining of moderate, intermittent, dizziness accompanied by light-headedness onset earlier today. Per pt, she notes being seen at [facility] today for same complaints and had an abnormal EKG. Pt was sent to the ED for further evaluation. Per EMS, pt presented with a 201/92 blood pressure PTA. She describes her dizziness as a near-like syncope feeling. She states her dizziness is minimally relieved with sitting down. Pt denies CP, SOB, vomiting, diarrhea, weakness, numbness. Pt also denies starting any new medications recently. No radiation of symptoms. No modifying factors. No vertigo. No symptoms at rest.   Past Medical History:  Diagnosis Date  . Chronic cystitis   . Diabetes mellitus   . Elevated cholesterol   . Hypertension   . Mild dysplasia of cervix (CIN I)   . Osteopenia     Patient Active Problem List   Diagnosis Date Noted  . Osteopenia 01/14/2016  . Intramural leiomyoma of uterus 09/30/2014  . Type II or unspecified type diabetes mellitus without mention of complication, not stated as uncontrolled 09/19/2013  . Hypertension   . Elevated cholesterol     Past Surgical History:  Procedure Laterality Date  . CATARACT EXTRACTION Bilateral    x2   . CERVICAL BIOPSY  W/ LOOP ELECTRODE EXCISION  2009  . CHOLECYSTECTOMY    . COLPOSCOPY    . DILATION AND CURETTAGE OF UTERUS    . TUBAL LIGATION    . vocal cord surgery     nodules excised      Current Outpatient Rx  . Order #: [id number] Class: Historical Med  . Order #: [id number] Class: Historical Med  . Order #: [id number] Class: Historical Med  . Order #: [id number] Class: Historical Med  . Order #: [id number] Class: Historical Med  . Order #: [id number] Class: Historical Med  . Order #: [id number] Class: Historical Med  . Order #: [id number] Class: Historical Med  . Order #: [id number] Class: Historical Med    Allergies Bee venom and Penicillins  Family History  Problem Relation Age of Onset  . Diabetes Mother   . Hypertension Mother   . Heart disease Mother   . Stroke Mother   . Breast cancer Mother     Age 69  . Lung cancer Father   . Breast cancer Maternal Aunt     Age 24's    Social History Social History  Substance Use Topics  . Smoking status: Never Smoker  . Smokeless tobacco: Never Used  . Alcohol use No    Review of Systems  Constitutional: No fever/chills. Positive lightheadedness.  Eyes: No visual changes. ENT: No sore throat. Cardiovascular: Denies chest pain. Respiratory: Denies shortness of breath. Gastrointestinal: No abdominal pain.  No nausea, no vomiting.  No diarrhea.  No constipation. Genitourinary: Negative for dysuria. Musculoskeletal: Negative for back pain. Skin: Negative for rash. Neurological: Negative for headaches, focal weakness or numbness.  10-point ROS otherwise negative.  ____________________________________________   PHYSICAL EXAM:  VITAL SIGNS: ED Triage Vitals [03/15/17 1406]  Enc Vitals Group  BP (!) 167/86     Pulse Rate 100     Resp 18     Temp 97.7 F (36.5 C)     Temp Source Oral     SpO2 100 %     Weight 178 lb (80.7 kg)     Height 5' 3.5" (1.613 m)   Constitutional: Alert and oriented. Well appearing and in no acute distress. Eyes: Conjunctivae are normal. PERRL. EOMI. Head: Atraumatic. Nose: No congestion/rhinnorhea. Mouth/Throat: Mucous membranes are moist.  Oropharynx non-erythematous. Neck: No  stridor.  No meningeal signs.   Cardiovascular: Normal rate, regular rhythm. Good peripheral circulation. Grossly normal heart sounds.   Respiratory: Normal respiratory effort.  No retractions. Lungs CTAB. Gastrointestinal: Soft and nontender. No distention.  Musculoskeletal: No lower extremity tenderness nor edema. No gross deformities of extremities. Neurologic:  Normal speech and language. No gross focal neurologic deficits are appreciated. Normal CN exam 2-12. Normal finger-to-nose testing. Normal gait.  Skin:  Skin is warm, dry and intact. No rash noted. Psychiatric: Mood and affect are normal. Speech and behavior are normal.  ____________________________________________   LABS (all labs ordered are listed, but only abnormal results are displayed)  Labs Reviewed  BASIC METABOLIC PANEL - Abnormal; Notable for the following:       Result Value   Glucose, Bld 187 (*)    Creatinine, Ser 1.01 (*)    GFR calc non Af Amer 55 (*)    All other components within normal limits  URINALYSIS, ROUTINE W REFLEX MICROSCOPIC - Abnormal; Notable for the following:    Glucose, UA 50 (*)    All other components within normal limits  CBG MONITORING, ED - Abnormal; Notable for the following:    Glucose-Capillary 186 (*)    All other components within normal limits  CBG MONITORING, ED - Abnormal; Notable for the following:    Glucose-Capillary 134 (*)    All other components within normal limits  CBC  TROPONIN I  I-STAT TROPOININ, ED   ____________________________________________  EKG   EKG Interpretation  Date/Time:  Tuesday March 15 2017 20:40:16 EDT Ventricular Rate:  92 PR Interval:  154 QRS Duration: 96 QT Interval:  358 QTC Calculation: 443 R Axis:   114 Text Interpretation:  Sinus rhythm Right axis deviation Baseline wander in lead(s) V6 No STEMI. Similar to prior.  Confirmed by LONG MD, JOSHUA [phone]) on 03/15/2017 9:25:41 PM        ____________________________________________  RADIOLOGY  Dg Chest 2 View  Result Date: 03/15/2017 CLINICAL DATA:  Initial evaluation for acute dizziness, abnormal EKG. Hypertension. EXAM: CHEST  2 VIEW COMPARISON:  Prior radiograph from 12/02/2004. FINDINGS: Transverse heart size at the upper limits of normal. Mediastinal silhouette normal. Lungs normally inflated. No focal infiltrate, pulmonary edema, or pleural effusion. No pneumothorax. No acute osseus abnormality. IMPRESSION: No radiographic evidence for active cardiopulmonary disease. Electronically Signed   By: Jeannine Boga M.D.   On: 03/15/2017 14:49    ____________________________________________   PROCEDURES  Procedure(s) performed:   Procedures  None ____________________________________________   INITIAL IMPRESSION / ASSESSMENT AND PLAN / ED COURSE  Pertinent labs & imaging results that were available during my care of the patient were reviewed by me and considered in my medical decision making (see chart for details).  Patient resents to the emergency department for evaluation of lightheadedness this morning. She was sent by ambulance for abnormal EKG at the urgent care. I have reviewed the tracing and have not found any significant abnormalities. Patient  with an intact neurological exam as outlined above. Very low suspicion for central process.   09:24 PM Repeat troponin is negative. Normal orthostatic vital signs. Repeat EKG with no changes. Plan for discharge with PCP follow up.   At this time, I do not feel there is any life-threatening condition present. I have reviewed and discussed all results (EKG, imaging, lab, urine as appropriate), exam findings with patient. I have reviewed nursing notes and appropriate previous records.  I feel the patient is safe to be discharged home without further emergent workup. Discussed usual and customary return precautions. Patient and family (if present) verbalize  understanding and are comfortable with this plan.  Patient will follow-up with their primary care provider. If they do not have a primary care provider, information for follow-up has been provided to them. All questions have been answered.  ____________________________________________  FINAL CLINICAL IMPRESSION(S) / ED DIAGNOSES  Final diagnoses:  Dizziness     MEDICATIONS GIVEN DURING THIS VISIT:  Medications  sodium chloride 0.9 % bolus 500 mL (500 mLs Intravenous New Bag/Given 03/15/17 2124)     NEW OUTPATIENT MEDICATIONS STARTED DURING THIS VISIT:  None   Note:  This document was prepared using Dragon voice recognition software and may include unintentional dictation errors.  Nanda Quinton, MD Emergency Medicine   I personally performed the services described in this documentation, which was scribed in my presence. The recorded information has been reviewed and is accurate.       Sheila Fast, MD 03/15/17 [phone]

## 2017-03-15 NOTE — Discharge Instructions (Signed)
You were seen in the ED today with lightheadedness. Your EKG looks normal here along with your lab work. Follow up with your PCP and the Cardiology team listed below.   Return to the ED immediately with any chest pain, sudden severe headache, or numbness/weakness.

## 2017-03-15 NOTE — ED Notes (Signed)
Pt verbized understanding of discharge instructions. Teach by method used.

## 2017-04-20 ENCOUNTER — Encounter: Payer: Self-pay | Admitting: Gynecology

## 2017-07-27 ENCOUNTER — Other Ambulatory Visit: Payer: Self-pay | Admitting: Family Medicine

## 2017-07-27 DIAGNOSIS — Z1231 Encounter for screening mammogram for malignant neoplasm of breast: Secondary | ICD-10-CM

## 2017-08-06 DIAGNOSIS — N63 Unspecified lump in unspecified breast: Secondary | ICD-10-CM

## 2017-08-06 HISTORY — DX: Unspecified lump in unspecified breast: N63.0

## 2017-08-24 ENCOUNTER — Ambulatory Visit
Admission: RE | Admit: 2017-08-24 | Discharge: 2017-08-24 | Disposition: A | Discharge status: Home / Self Care | Payer: PRIVATE HEALTH INSURANCE | Source: Ambulatory Visit | Attending: Family Medicine | Admitting: Family Medicine

## 2017-08-24 ENCOUNTER — Ambulatory Visit: Payer: PRIVATE HEALTH INSURANCE

## 2017-08-24 DIAGNOSIS — Z1231 Encounter for screening mammogram for malignant neoplasm of breast: Secondary | ICD-10-CM

## 2018-02-03 DIAGNOSIS — M8580 Other specified disorders of bone density and structure, unspecified site: Secondary | ICD-10-CM

## 2018-02-03 HISTORY — DX: Other specified disorders of bone density and structure, unspecified site: M85.80

## 2018-02-10 ENCOUNTER — Encounter: Payer: Self-pay | Admitting: Obstetrics & Gynecology

## 2018-02-10 ENCOUNTER — Ambulatory Visit (INDEPENDENT_AMBULATORY_CARE_PROVIDER_SITE_OTHER): Payer: Managed Care, Other (non HMO) | Admitting: Obstetrics & Gynecology

## 2018-02-10 VITALS — BP 132/90 | Ht 63.5 in | Wt 171.0 lb

## 2018-02-10 DIAGNOSIS — Z01411 Encounter for gynecological examination (general) (routine) with abnormal findings: Secondary | ICD-10-CM

## 2018-02-10 DIAGNOSIS — M8589 Other specified disorders of bone density and structure, multiple sites: Secondary | ICD-10-CM | POA: Diagnosis not present

## 2018-02-10 DIAGNOSIS — Z780 Asymptomatic menopausal state: Secondary | ICD-10-CM | POA: Diagnosis not present

## 2018-02-10 DIAGNOSIS — Z13820 Encounter for screening for osteoporosis: Secondary | ICD-10-CM

## 2018-02-10 DIAGNOSIS — Z9889 Other specified postprocedural states: Secondary | ICD-10-CM

## 2018-02-10 NOTE — Progress Notes (Signed)
Sheila Keller 12-Jul-1946 [id number]   History:    72 y.o. G2P2L2  RP:  Established patient presenting for annual gyn exam   HPI: Menopausal, well on no hormone replacement therapy.  No postmenopausal bleeding.  No pelvic pain.  Not sexually active.  Urine and bowel movements normal.  Breasts normal.  Mother with breast cancer diagnosed at age 26.  No genetic testing done.  History of LEEP for cervical dysplasia in 2009.  Pap test negative thereafter.  Past medical history,surgical history, family history and social history were all reviewed and documented in the EPIC chart.  Gynecologic History No LMP recorded. Patient is postmenopausal. Contraception: post menopausal status Last Pap: 01/2017. Results were: Negative Last mammogram: 08/2017. Results were: Negative Bone Density: 01/2016 Osteopenia with T-Score at -1.7 at Rt Femoral Neck  Colonoscopy: 2011 negative  Obstetric History OB History  Gravida Para Term Preterm AB Living  2 2 2     2   SAB TAB Ectopic Multiple Live Births               # Outcome Date GA Lbr Len/2nd Weight Sex Delivery Anes PTL Lv  2 Term           1 Term                ROS: A ROS was performed and pertinent positives and negatives are included in the history.  GENERAL: No fevers or chills. HEENT: No change in vision, no earache, sore throat or sinus congestion. NECK: No pain or stiffness. CARDIOVASCULAR: No chest pain or pressure. No palpitations. PULMONARY: No shortness of breath, cough or wheeze. GASTROINTESTINAL: No abdominal pain, nausea, vomiting or diarrhea, melena or bright red blood per rectum. GENITOURINARY: No urinary frequency, urgency, hesitancy or dysuria. MUSCULOSKELETAL: No joint or muscle pain, no back pain, no recent trauma. DERMATOLOGIC: No rash, no itching, no lesions. ENDOCRINE: No polyuria, polydipsia, no heat or cold intolerance. No recent change in weight. HEMATOLOGICAL: No anemia or easy bruising or bleeding. NEUROLOGIC: No headache,  seizures, numbness, tingling or weakness. PSYCHIATRIC: No depression, no loss of interest in normal activity or change in sleep pattern.     Exam:   BP 132/90   Ht 5' 3.5" (1.613 m)   Wt 171 lb (77.6 kg)   BMI 29.82 kg/m   Body mass index is 29.82 kg/m.  General appearance : Well developed well nourished female. No acute distress HEENT: Eyes: no retinal hemorrhage or exudates,  Neck supple, trachea midline, no carotid bruits, no thyroidmegaly Lungs: Clear to auscultation, no rhonchi or wheezes, or rib retractions  Heart: Regular rate and rhythm, no murmurs or gallops Breast:Examined in sitting and supine position were symmetrical in appearance, no palpable masses or tenderness,  no skin retraction, no nipple inversion, no nipple discharge, no skin discoloration, no axillary or supraclavicular lymphadenopathy Abdomen: no palpable masses or tenderness, no rebound or guarding Extremities: no edema or skin discoloration or tenderness  Pelvic: Vulva: Normal             Vagina: No gross lesions or discharge  Cervix: No gross lesions or discharge.  Pap reflex done  Uterus  AV, normal size, shape and consistency, non-tender and mobile  Adnexa  Without masses or tenderness  Anus: Normal   Assessment/Plan:  72 y.o. female for annual exam   1. Encounter for gynecological examination with abnormal finding Normal gynecologic exam in menopause.  Pap reflex done.  History of LEEP in 2009, negative Paps  thereafter.  Breast exam normal.  Last screening mammogram negative in September 2018.  Colonoscopy 2011.  Health labs with family physician.  2. Menopause present Well on no hormone replacement therapy.  No postmenopausal bleeding.  3. Osteopenia of multiple sites Vitamin D supplements, calcium rich nutrition and regular weightbearing physical activity recommended.  Will schedule bone density here now. - DG Bone Density; Future  4. Screening for osteoporosis Schedule bone density here  now. - DG Bone Density; Future  5. H/O LEEP Pap reflex done today.  Counseling on above issues more than 50% for 10 minutes.  Princess Bruins MD, 9:18 AM 02/10/2018

## 2018-02-10 NOTE — Progress Notes (Signed)
[id number] 

## 2018-02-12 ENCOUNTER — Encounter: Payer: Self-pay | Admitting: Obstetrics & Gynecology

## 2018-02-12 NOTE — Patient Instructions (Signed)
1. Encounter for gynecological examination with abnormal finding Normal gynecologic exam in menopause.  Pap reflex done.  History of LEEP in 2009, negative Paps thereafter.  Breast exam normal.  Last screening mammogram negative in September 2018.  Colonoscopy 2011.  Health labs with family physician.  2. Menopause present Well on no hormone replacement therapy.  No postmenopausal bleeding.  3. Osteopenia of multiple sites Vitamin D supplements, calcium rich nutrition and regular weightbearing physical activity recommended.  Will schedule bone density here now. - DG Bone Density; Future  4. Screening for osteoporosis Schedule bone density here now. - DG Bone Density; Future  5. H/O LEEP Pap reflex done today.  Sheila Keller, it was a pleasure meeting you today!  I will inform you of your results as soon as they are available.   Health Maintenance for Postmenopausal Women Menopause is a normal process in which your reproductive ability comes to an end. This process happens gradually over a span of months to years, usually between the ages of 98 and 64. Menopause is complete when you have missed 12 consecutive menstrual periods. It is important to talk with your health care provider about some of the most common conditions that affect postmenopausal women, such as heart disease, cancer, and bone loss (osteoporosis). Adopting a healthy lifestyle and getting preventive care can help to promote your health and wellness. Those actions can also lower your chances of developing some of these common conditions. What should I know about menopause? During menopause, you may experience a number of symptoms, such as:  Moderate-to-severe hot flashes.  Night sweats.  Decrease in sex drive.  Mood swings.  Headaches.  Tiredness.  Irritability.  Memory problems.  Insomnia.  Choosing to treat or not to treat menopausal changes is an individual decision that you make with your health care  provider. What should I know about hormone replacement therapy and supplements? Hormone therapy products are effective for treating symptoms that are associated with menopause, such as hot flashes and night sweats. Hormone replacement carries certain risks, especially as you become older. If you are thinking about using estrogen or estrogen with progestin treatments, discuss the benefits and risks with your health care provider. What should I know about heart disease and stroke? Heart disease, heart attack, and stroke become more likely as you age. This may be due, in part, to the hormonal changes that your body experiences during menopause. These can affect how your body processes dietary fats, triglycerides, and cholesterol. Heart attack and stroke are both medical emergencies. There are many things that you can do to help prevent heart disease and stroke:  Have your blood pressure checked at least every 1-2 years. High blood pressure causes heart disease and increases the risk of stroke.  If you are 94-2 years old, ask your health care provider if you should take aspirin to prevent a heart attack or a stroke.  Do not use any tobacco products, including cigarettes, chewing tobacco, or electronic cigarettes. If you need help quitting, ask your health care provider.  It is important to eat a healthy diet and maintain a healthy weight. ? Be sure to include plenty of vegetables, fruits, low-fat dairy products, and lean protein. ? Avoid eating foods that are high in solid fats, added sugars, or salt (sodium).  Get regular exercise. This is one of the most important things that you can do for your health. ? Try to exercise for at least 150 minutes each week. The type of exercise that you  do should increase your heart rate and make you sweat. This is known as moderate-intensity exercise. ? Try to do strengthening exercises at least twice each week. Do these in addition to the moderate-intensity  exercise.  Know your numbers.Ask your health care provider to check your cholesterol and your blood glucose. Continue to have your blood tested as directed by your health care provider.  What should I know about cancer screening? There are several types of cancer. Take the following steps to reduce your risk and to catch any cancer development as early as possible. Breast Cancer  Practice breast self-awareness. ? This means understanding how your breasts normally appear and feel. ? It also means doing regular breast self-exams. Let your health care provider know about any changes, no matter how small.  If you are 32 or older, have a clinician do a breast exam (clinical breast exam or CBE) every year. Depending on your age, family history, and medical history, it may be recommended that you also have a yearly breast X-ray (mammogram).  If you have a family history of breast cancer, talk with your health care provider about genetic screening.  If you are at high risk for breast cancer, talk with your health care provider about having an MRI and a mammogram every year.  Breast cancer (BRCA) gene test is recommended for women who have family members with BRCA-related cancers. Results of the assessment will determine the need for genetic counseling and BRCA1 and for BRCA2 testing. BRCA-related cancers include these types: ? Breast. This occurs in males or females. ? Ovarian. ? Tubal. This may also be called fallopian tube cancer. ? Cancer of the abdominal or pelvic lining (peritoneal cancer). ? Prostate. ? Pancreatic.  Cervical, Uterine, and Ovarian Cancer Your health care provider may recommend that you be screened regularly for cancer of the pelvic organs. These include your ovaries, uterus, and vagina. This screening involves a pelvic exam, which includes checking for microscopic changes to the surface of your cervix (Pap test).  For women ages 21-65, health care providers may recommend a  pelvic exam and a Pap test every three years. For women ages 66-65, they may recommend the Pap test and pelvic exam, combined with testing for human papilloma virus (HPV), every five years. Some types of HPV increase your risk of cervical cancer. Testing for HPV may also be done on women of any age who have unclear Pap test results.  Other health care providers may not recommend any screening for nonpregnant women who are considered low risk for pelvic cancer and have no symptoms. Ask your health care provider if a screening pelvic exam is right for you.  If you have had past treatment for cervical cancer or a condition that could lead to cancer, you need Pap tests and screening for cancer for at least 20 years after your treatment. If Pap tests have been discontinued for you, your risk factors (such as having a new sexual partner) need to be reassessed to determine if you should start having screenings again. Some women have medical problems that increase the chance of getting cervical cancer. In these cases, your health care provider may recommend that you have screening and Pap tests more often.  If you have a family history of uterine cancer or ovarian cancer, talk with your health care provider about genetic screening.  If you have vaginal bleeding after reaching menopause, tell your health care provider.  There are currently no reliable tests available to screen for  ovarian cancer.  Lung Cancer Lung cancer screening is recommended for adults 78-4 years old who are at high risk for lung cancer because of a history of smoking. A yearly low-dose CT scan of the lungs is recommended if you:  Currently smoke.  Have a history of at least 30 pack-years of smoking and you currently smoke or have quit within the past 15 years. A pack-year is smoking an average of one pack of cigarettes per day for one year.  Yearly screening should:  Continue until it has been 15 years since you quit.  Stop if  you develop a health problem that would prevent you from having lung cancer treatment.  Colorectal Cancer  This type of cancer can be detected and can often be prevented.  Routine colorectal cancer screening usually begins at age 59 and continues through age 38.  If you have risk factors for colon cancer, your health care provider may recommend that you be screened at an earlier age.  If you have a family history of colorectal cancer, talk with your health care provider about genetic screening.  Your health care provider may also recommend using home test kits to check for hidden blood in your stool.  A small camera at the end of a tube can be used to examine your colon directly (sigmoidoscopy or colonoscopy). This is done to check for the earliest forms of colorectal cancer.  Direct examination of the colon should be repeated every 5-10 years until age 43. However, if early forms of precancerous polyps or small growths are found or if you have a family history or genetic risk for colorectal cancer, you may need to be screened more often.  Skin Cancer  Check your skin from head to toe regularly.  Monitor any moles. Be sure to tell your health care provider: ? About any new moles or changes in moles, especially if there is a change in a mole's shape or color. ? If you have a mole that is larger than the size of a pencil eraser.  If any of your family members has a history of skin cancer, especially at a young age, talk with your health care provider about genetic screening.  Always use sunscreen. Apply sunscreen liberally and repeatedly throughout the day.  Whenever you are outside, protect yourself by wearing long sleeves, pants, a wide-brimmed hat, and sunglasses.  What should I know about osteoporosis? Osteoporosis is a condition in which bone destruction happens more quickly than new bone creation. After menopause, you may be at an increased risk for osteoporosis. To help prevent  osteoporosis or the bone fractures that can happen because of osteoporosis, the following is recommended:  If you are 47-42 years old, get at least 1,000 mg of calcium and at least 600 mg of vitamin D per day.  If you are older than age 80 but younger than age 65, get at least 1,200 mg of calcium and at least 600 mg of vitamin D per day.  If you are older than age 13, get at least 1,200 mg of calcium and at least 800 mg of vitamin D per day.  Smoking and excessive alcohol intake increase the risk of osteoporosis. Eat foods that are rich in calcium and vitamin D, and do weight-bearing exercises several times each week as directed by your health care provider. What should I know about how menopause affects my mental health? Depression may occur at any age, but it is more common as you become older.  Common symptoms of depression include:  Low or sad mood.  Changes in sleep patterns.  Changes in appetite or eating patterns.  Feeling an overall lack of motivation or enjoyment of activities that you previously enjoyed.  Frequent crying spells.  Talk with your health care provider if you think that you are experiencing depression. What should I know about immunizations? It is important that you get and maintain your immunizations. These include:  Tetanus, diphtheria, and pertussis (Tdap) booster vaccine.  Influenza every year before the flu season begins.  Pneumonia vaccine.  Shingles vaccine.  Your health care provider may also recommend other immunizations. This information is not intended to replace advice given to you by your health care provider. Make sure you discuss any questions you have with your health care provider. Document Released: 01/14/2006 Document Revised: 06/11/2016 Document Reviewed: 08/26/2015 Elsevier Interactive Patient Education  2018 [employer].

## 2018-02-13 NOTE — Addendum Note (Signed)
Addended by: Alen Blew on: 02/13/2018 03:08 PM   Modules accepted: Level of Service

## 2018-02-14 ENCOUNTER — Other Ambulatory Visit: Payer: Self-pay | Admitting: Obstetrics & Gynecology

## 2018-02-14 ENCOUNTER — Other Ambulatory Visit: Payer: Self-pay | Admitting: Gynecology

## 2018-02-14 DIAGNOSIS — M8589 Other specified disorders of bone density and structure, multiple sites: Secondary | ICD-10-CM

## 2018-02-15 LAB — PAP IG W/ RFLX HPV ASCU

## 2018-02-28 ENCOUNTER — Ambulatory Visit (INDEPENDENT_AMBULATORY_CARE_PROVIDER_SITE_OTHER): Payer: Managed Care, Other (non HMO)

## 2018-02-28 ENCOUNTER — Encounter: Payer: Self-pay | Admitting: Gynecology

## 2018-02-28 DIAGNOSIS — M8589 Other specified disorders of bone density and structure, multiple sites: Secondary | ICD-10-CM

## 2018-04-23 IMAGING — DX DG CHEST 2V
2 series · 2 of 2 positions shown · non-contrast
Comparison: Prior radiograph from 12/02/2004.

CLINICAL DATA: Initial evaluation for acute dizziness, abnormal
EKG. Hypertension.

EXAM:
CHEST  2 VIEW

[chest pa]
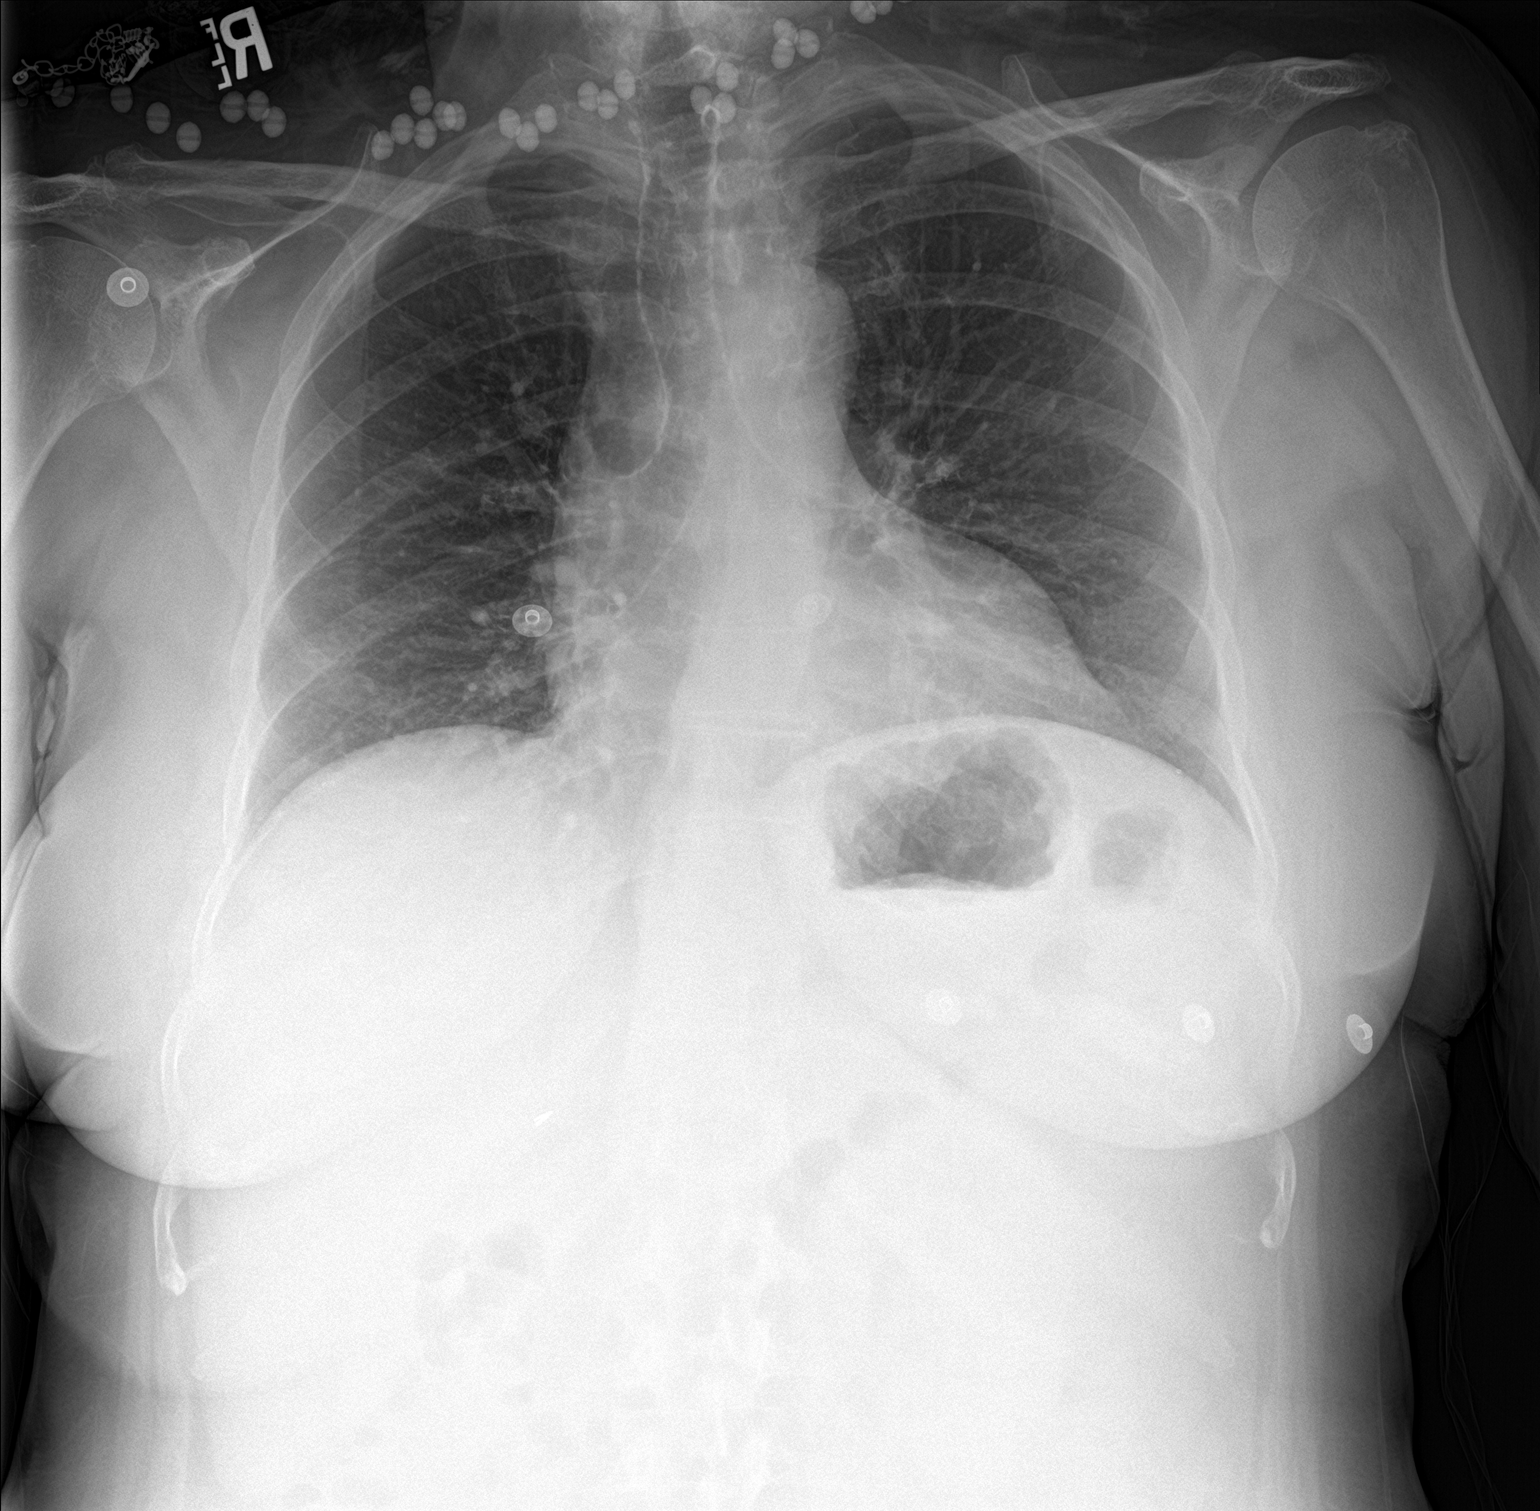

[chest lat]
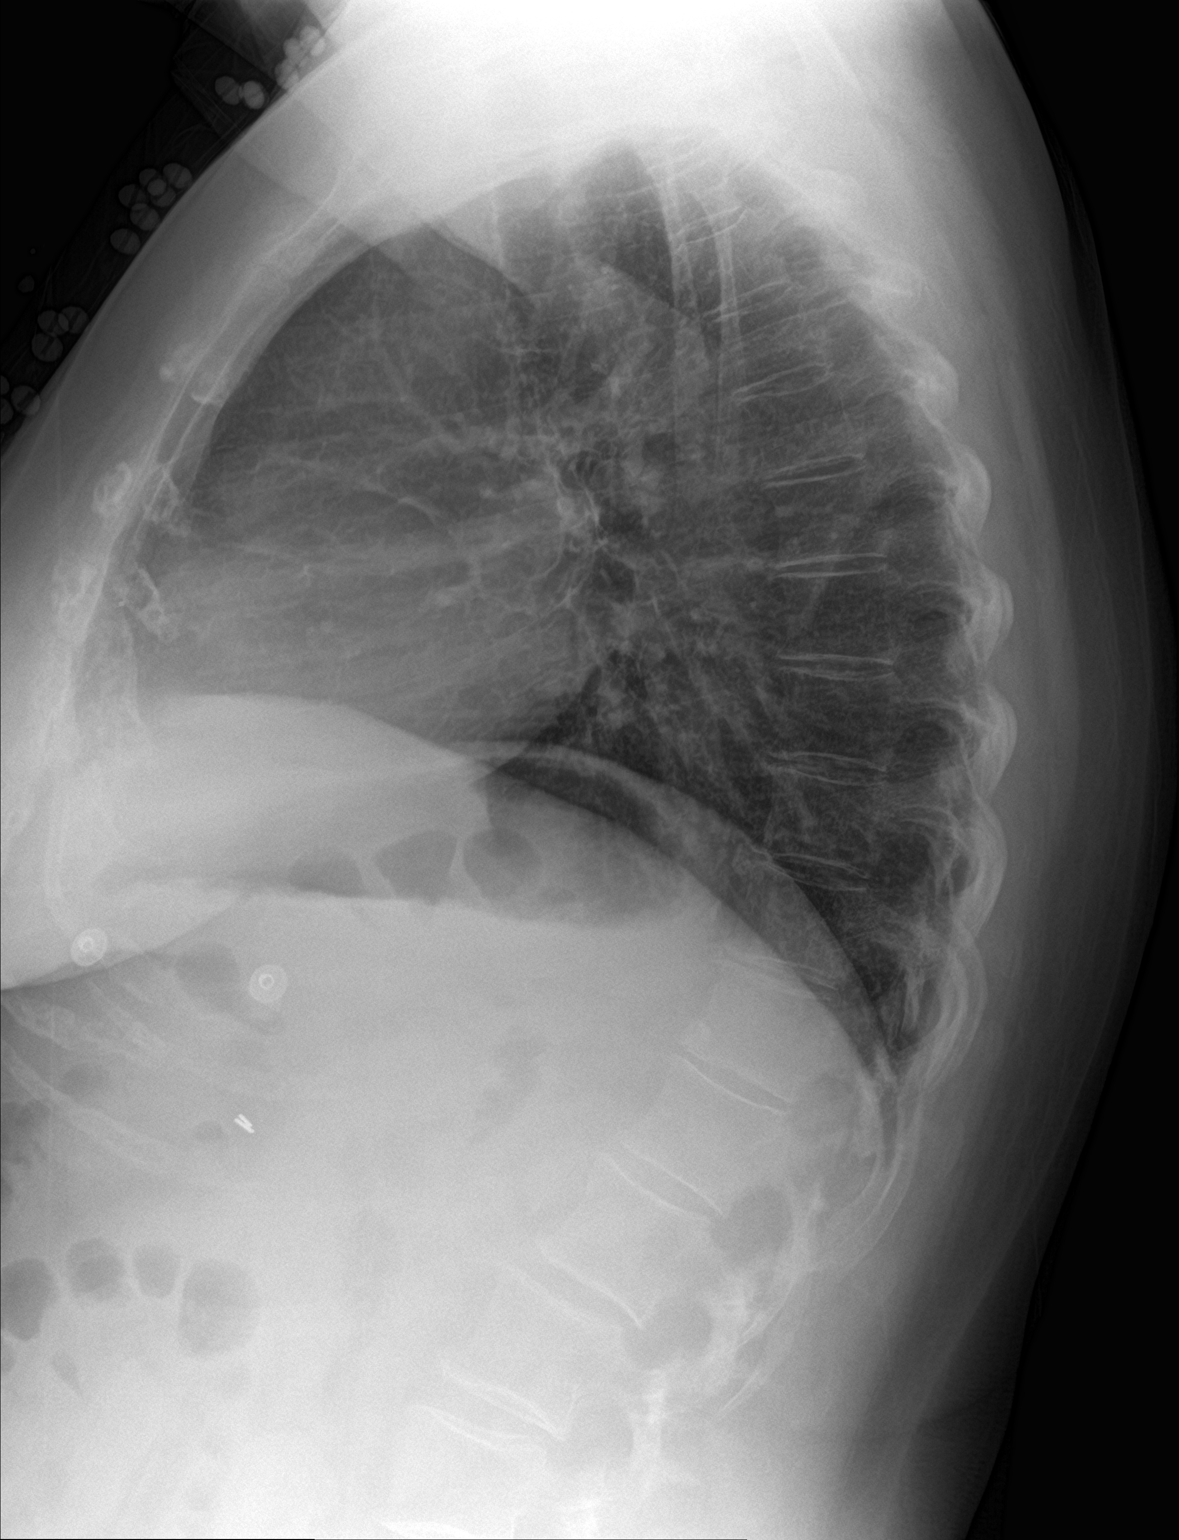

[2 of 2 positions shown; findings below may reference images not displayed]

FINDINGS: Transverse heart size at the upper limits of normal. Mediastinal
silhouette normal.

Lungs normally inflated. No focal infiltrate, pulmonary edema, or
pleural effusion. No pneumothorax.

No acute osseus abnormality.
IMPRESSION: No radiographic evidence for active cardiopulmonary disease.

## 2018-07-17 ENCOUNTER — Other Ambulatory Visit: Payer: Self-pay | Admitting: Family Medicine

## 2018-07-17 DIAGNOSIS — Z1231 Encounter for screening mammogram for malignant neoplasm of breast: Secondary | ICD-10-CM

## 2018-08-28 ENCOUNTER — Ambulatory Visit
Admission: RE | Admit: 2018-08-28 | Discharge: 2018-08-28 | Disposition: A | Discharge status: Home / Self Care | Payer: Managed Care, Other (non HMO) | Source: Ambulatory Visit | Attending: Family Medicine | Admitting: Family Medicine

## 2018-08-28 DIAGNOSIS — Z1231 Encounter for screening mammogram for malignant neoplasm of breast: Secondary | ICD-10-CM

## 2019-02-13 ENCOUNTER — Encounter: Payer: Self-pay | Admitting: Gynecology

## 2019-02-13 ENCOUNTER — Ambulatory Visit (INDEPENDENT_AMBULATORY_CARE_PROVIDER_SITE_OTHER): Payer: Managed Care, Other (non HMO) | Admitting: Gynecology

## 2019-02-13 VITALS — BP 124/80 | Ht 65.0 in | Wt 161.0 lb

## 2019-02-13 DIAGNOSIS — Z01419 Encounter for gynecological examination (general) (routine) without abnormal findings: Secondary | ICD-10-CM

## 2019-02-13 DIAGNOSIS — N952 Postmenopausal atrophic vaginitis: Secondary | ICD-10-CM

## 2019-02-13 DIAGNOSIS — M8580 Other specified disorders of bone density and structure, unspecified site: Secondary | ICD-10-CM

## 2019-02-13 NOTE — Patient Instructions (Signed)
Follow-up in 1 year, sooner as needed. 

## 2019-02-13 NOTE — Progress Notes (Signed)
    Sheila Keller Dec 02, 1946 [id number]        73 y.o.  G2P2002 for breast and pelvic exam.  Without gynecologic complaints.  Past medical history,surgical history, problem list, medications, allergies, family history and social history were all reviewed and documented as reviewed in the EPIC chart.  ROS:  Performed with pertinent positives and negatives included in the history, assessment and plan.   Additional significant findings : None   Exam: Caryn Bee assistant Vitals:   02/13/19 1044  BP: 124/80  Weight: 161 lb (73 kg)  Height: 5\' 5"  (1.651 m)   Body mass index is 26.79 kg/m.  General appearance:  Normal affect, orientation and appearance. Skin: Grossly normal HEENT: Without gross lesions.  No cervical or supraclavicular adenopathy. Thyroid normal.  Lungs:  Clear without wheezing, rales or rhonchi Cardiac: RR, without RMG Abdominal:  Soft, nontender, without masses, guarding, rebound, organomegaly or hernia Breasts:  Examined lying and sitting without masses, retractions, discharge or axillary adenopathy. Pelvic:  Ext, BUS, Vagina: With atrophic changes  Cervix: With atrophic changes  Uterus: Axial, normal size, shape and contour, midline and mobile nontender   Adnexa: Without masses or tenderness    Anus and perineum: Normal   Rectovaginal: Normal sphincter tone without palpated masses or tenderness.    Assessment/Plan:  73 y.o. [id number] female for breast and pelvic exam  1. Postmenopausal.  No significant menopausal symptoms or any vaginal bleeding. 2. Osteopenia.  DEXA 2019 T score -1.8 FRAX 11% / 2.2%.  Recommend repeat DEXA next year at 2-year interval. 3. Pap smear 2019.  No Pap smear done today.  Options to stop screening per current screening guidelines and age reviewed.  Will readdress on an annual basis. 4. Mammography 08/2018.  Continue with annual mammography when due.  Breast exam normal today. 5. Colonoscopy 2011.  Repeat at their recommended  interval. 6. Health maintenance.  No routine lab work done as patient does this elsewhere.  Follow-up 1 year, sooner as needed.   Anastasio Auerbach MD, 11:08 AM 02/13/2019

## 2019-07-02 ENCOUNTER — Ambulatory Visit (INDEPENDENT_AMBULATORY_CARE_PROVIDER_SITE_OTHER): Payer: Managed Care, Other (non HMO) | Admitting: Otolaryngology

## 2019-07-02 ENCOUNTER — Other Ambulatory Visit: Payer: Self-pay

## 2019-07-02 DIAGNOSIS — H6122 Impacted cerumen, left ear: Secondary | ICD-10-CM

## 2019-07-18 ENCOUNTER — Other Ambulatory Visit: Payer: Self-pay | Admitting: Family Medicine

## 2019-07-18 DIAGNOSIS — Z1231 Encounter for screening mammogram for malignant neoplasm of breast: Secondary | ICD-10-CM

## 2019-09-03 ENCOUNTER — Ambulatory Visit
Admission: RE | Admit: 2019-09-03 | Discharge: 2019-09-03 | Disposition: A | Discharge status: Home / Self Care | Payer: Managed Care, Other (non HMO) | Source: Ambulatory Visit | Attending: Family Medicine | Admitting: Family Medicine

## 2019-09-03 ENCOUNTER — Other Ambulatory Visit: Payer: Self-pay

## 2019-09-03 DIAGNOSIS — Z1231 Encounter for screening mammogram for malignant neoplasm of breast: Secondary | ICD-10-CM

## 2019-09-12 ENCOUNTER — Encounter: Payer: Self-pay | Admitting: Gynecology

## 2020-02-12 ENCOUNTER — Other Ambulatory Visit: Payer: Self-pay

## 2020-02-14 ENCOUNTER — Telehealth: Payer: Self-pay | Admitting: *Deleted

## 2020-02-14 ENCOUNTER — Encounter: Payer: Self-pay | Admitting: Obstetrics and Gynecology

## 2020-02-14 ENCOUNTER — Ambulatory Visit: Payer: Medicare Other | Admitting: Obstetrics and Gynecology

## 2020-02-14 ENCOUNTER — Other Ambulatory Visit: Payer: Self-pay

## 2020-02-14 VITALS — BP 122/76 | Ht 63.5 in | Wt 154.0 lb

## 2020-02-14 DIAGNOSIS — M8580 Other specified disorders of bone density and structure, unspecified site: Secondary | ICD-10-CM

## 2020-02-14 DIAGNOSIS — Z01419 Encounter for gynecological examination (general) (routine) without abnormal findings: Secondary | ICD-10-CM

## 2020-02-14 DIAGNOSIS — R011 Cardiac murmur, unspecified: Secondary | ICD-10-CM

## 2020-02-14 NOTE — Telephone Encounter (Signed)
Referral placed in epic at [facility] , they will call to schedule patient.

## 2020-02-14 NOTE — Telephone Encounter (Signed)
Patient scheduled on 02/18/20 @ 2:20pm with Donato Heinz, MD

## 2020-02-14 NOTE — Telephone Encounter (Signed)
-----   Message from Joseph Pierini, MD sent at 02/14/2020 11:40 AM EST ----- Regarding: cardiology referral Hi, can we please arrange a cardiology referral for this patient? Heard a new heart murmur on her exam today. Thank you.

## 2020-02-14 NOTE — Progress Notes (Signed)
Sheila Keller 07/07/1946 [id number]  SUBJECTIVE:  74 y.o. G2P2002 female for annual routine gynecologic exam and Pap smear. She has no gynecologic concerns.  Notes newer onset occasional dizziness, but no chest pain, no shortness of breath.  Notes she has been losing weight.  She indicates she has had improvement in her blood sugars with adjustments to her glycemic medications.  Current Outpatient Medications  Medication Sig Dispense Refill  . ALPRAZolam (XANAX) 0.5 MG tablet 1/2-1 TABLET TWICE A DAY, AS NEEDED FOR ANXIETY ORALLY  1  . aspirin 81 MG chewable tablet Chew 81 mg by mouth every morning.     Marland Kitchen atorvastatin (LIPITOR) 40 MG tablet Take 40 mg by mouth every morning.    . Cholecalciferol (VITAMIN D3) 2000 units TABS Take 1 capsule by mouth every morning.    . empagliflozin (JARDIANCE) 25 MG TABS tablet Take 25 mg by mouth daily.    Marland Kitchen EPINEPHrine (EPI-PEN) 0.3 mg/0.3 mL DEVI Inject 0.3 mg into the muscle once.     Marland Kitchen losartan-hydrochlorothiazide (HYZAAR) 100-12.5 MG per tablet Take 0.5 tablets by mouth every morning. 1/2 TAB PO QD    . metFORMIN (GLUCOPHAGE) 500 MG tablet Take 2 tablets by mouth 2 (two) times daily.  1  . sitaGLIPtin (JANUVIA) 100 MG tablet Take 100 mg by mouth daily.     No current facility-administered medications for this visit.   Allergies: Bee venom and Penicillins  No LMP recorded. Patient is postmenopausal.  Past medical history,surgical history, problem list, medications, allergies, family history and social history were all reviewed and documented as reviewed in the EPIC chart.  ROS:  Feeling well. No dyspnea or chest pain on exertion.  No abdominal pain, change in bowel habits, black or bloody stools.  No urinary tract symptoms. GYN ROS: no abnormal bleeding, pelvic pain or discharge, no breast pain or new or enlarging lumps on self exam. No neurological complaints.    OBJECTIVE:  BP 122/76   Ht 5' 3.5" (1.613 m)   Wt 154 lb (69.9 kg)   BMI 26.85  kg/m  The patient appears well, alert, oriented x 3, in no distress. ENT normal.  Neck supple. No cervical or supraclavicular adenopathy or thyromegaly.  Lungs are clear, good air entry, no wheezes, rhonchi or rales. S1 and S2 normal, 2/6 holosystolic murmur, regular rate and rhythm.  Abdomen soft without tenderness, guarding, mass or organomegaly.  Neurological is normal, no focal findings.  BREAST EXAM: breasts appear normal, no suspicious masses, no skin or nipple changes or axillary nodes  PELVIC EXAM: VULVA: normal appearing vulva with no masses, tenderness or lesions, VAGINA: normal appearing vagina with normal color and discharge, no lesions, CERVIX: normal appearing cervix without discharge or lesions, UTERUS: uterus is normal size, shape, consistency and nontender, ADNEXA: normal adnexa in size, nontender and no masses, PAP: Pap smear done today, thin-prep method  Chaperone: Caryn Bee present during the examination  ASSESSMENT:  74 y.o. [id number] here for annual gynecologic exam  PLAN:   1. Postmenopausal.  No vaginal bleeding or significant vasomotor symptoms. 2. Pap smear 2019.  History of CIN 1 noted, and history of LEEP.  Pap smear is collected today.  If normal, she can continue on 3-year interval if she desires to continue screening, we will readdress this at future visits. 3. Mammogram 08/2019.  Normal breast exam today.  She is reminded to schedule an annual mammogram this year when due. 4. Colonoscopy 2011.  Recommended that she follow up at  the recommended interval would probably be this year. 5. Osteopenia.  DEXA 2019 T score -1.8 FRAX 11% / 2.2%. DEXA was recommended this year at a 2-year interval so she plans to schedule. 6.  Heart murmur.  Detected on exam today.  Cardiology referral is requested.  Staff will contact her to arrange this. 7. Health maintenance.  No labs today as she normally has these completed with her primary care provider.   Return annually or  sooner, prn.  Joseph Pierini MD  02/14/20

## 2020-02-16 NOTE — Progress Notes (Signed)
Cardiology Office Note:    Date:  02/18/2020   ID:  ALEC DIPERNA, DOB August 26, 1946, MRN [id number]  PCP:  Maury Dus, MD  Cardiologist:  No primary care provider on file.  Electrophysiologist:  None   Referring MD: Joseph Pierini, MD   Chief Complaint  Patient presents with  . Heart Murmur    History of Present Illness:    Sheila Keller is a 74 y.o. female with a hx of hypertension, diabetes, hyperlipidemia who is referred by Dr. Delilah Shan for evaluation of heart murmur.  Noted at recent appointment with Dr Delilah Shan to have systolic heart murmur.  She denies any chest pain, dyspnea, palpitations, LE edema, lightheadeness, or syncope.  For exercise she walks around yard for 20 minutes.  She denies any exertional symptoms.  Never smoked.  Mother had MI in 4s.  Past Medical History:  Diagnosis Date  . Breast lump 08/2017   no cancer/ [facility]  . Chronic cystitis   . Diabetes mellitus   . Elevated cholesterol   . Hypertension   . Mild dysplasia of cervix (CIN I)   . Osteopenia 02/2018   T score -1.8 FRAX 11% / 2.2%    Past Surgical History:  Procedure Laterality Date  . BREAST BIOPSY Left   . CATARACT EXTRACTION Bilateral    x2   . CERVICAL BIOPSY  W/ LOOP ELECTRODE EXCISION  2009  . CHOLECYSTECTOMY    . COLPOSCOPY    . DILATION AND CURETTAGE OF UTERUS    . TUBAL LIGATION    . vocal cord surgery     nodules excised    Current Medications: Current Meds  Medication Sig  . ALPRAZolam (XANAX) 0.5 MG tablet 1/2-1 TABLET TWICE A DAY, AS NEEDED FOR ANXIETY ORALLY  . aspirin 81 MG chewable tablet Chew 81 mg by mouth every morning.   Marland Kitchen atorvastatin (LIPITOR) 40 MG tablet Take 40 mg by mouth every morning.  . Cholecalciferol (VITAMIN D3) 2000 units TABS Take 1 capsule by mouth every morning.  . empagliflozin (JARDIANCE) 25 MG TABS tablet Take 25 mg by mouth daily.  Marland Kitchen losartan-hydrochlorothiazide (HYZAAR) 100-12.5 MG per tablet Take 0.5 tablets by  mouth every morning. 1/2 TAB PO QD  . metFORMIN (GLUCOPHAGE) 500 MG tablet Take 2 tablets by mouth 2 (two) times daily.  . sitaGLIPtin (JANUVIA) 100 MG tablet Take 100 mg by mouth daily.     Allergies:   Bee venom and Penicillins   Social History   Socioeconomic History  . Marital status: Divorced    Spouse name: Not on file  . Number of children: Not on file  . Years of education: Not on file  . Highest education level: Not on file  Occupational History  . Not on file  Tobacco Use  . Smoking status: Never Smoker  . Smokeless tobacco: Never Used  Substance and Sexual Activity  . Alcohol use: No    Alcohol/week: 0.0 standard drinks  . Drug use: No  . Sexual activity: Never    Birth control/protection: Post-menopausal, Surgical    Comment: First sexual encounter at age 58. Less than 5 partners in her life.  Other Topics Concern  . Not on file  Social History Narrative  . Not on file   Social Determinants of Health   Financial Resource Strain:   . Difficulty of Paying Living Expenses:   Food Insecurity:   . Worried About Charity fundraiser in the Last Year:   .  Ran Out of Food in the Last Year:   Transportation Needs:   . Film/video editor (Medical):   Marland Kitchen Lack of Transportation (Non-Medical):   Physical Activity:   . Days of Exercise per Week:   . Minutes of Exercise per Session:   Stress:   . Feeling of Stress :   Social Connections:   . Frequency of Communication with Friends and Family:   . Frequency of Social Gatherings with Friends and Family:   . Attends Religious Services:   . Active Member of Clubs or Organizations:   . Attends Archivist Meetings:   Marland Kitchen Marital Status:      Family History: The patient's family history includes Breast cancer in her maternal aunt and mother; Diabetes in her mother and son; Heart attack in her son; Heart disease in her mother; Hypertension in her mother; Lung cancer in her father; Stroke in her mother.  ROS:    Please see the history of present illness.     All other systems reviewed and are negative.  EKGs/Labs/Other Studies Reviewed:    The following studies were reviewed today:   EKG:  EKG is ordered today.  The ekg ordered today demonstrates normal sinus rhythm, rate 108, right axis deviation, no ST abnormalities, Q waves in III, aVF  Recent Labs: No results found for requested labs within last 8760 hours.  Recent Lipid Panel No results found for: CHOL, TRIG, HDL, CHOLHDL, VLDL, LDLCALC, LDLDIRECT  Physical Exam:    VS:  BP 118/80 (BP Location: Right Arm, Patient Position: Sitting, Cuff Size: Normal)   Pulse (!) 108   Temp (!) 95.9 F (35.5 C)   Ht 5' 3.5" (1.613 m)   Wt 156 lb (70.8 kg)   LMP  (LMP Unknown)   SpO2 95%   BMI 27.20 kg/m     Wt Readings from Last 3 Encounters:  02/18/20 156 lb (70.8 kg)  02/14/20 154 lb (69.9 kg)  02/13/19 161 lb (73 kg)     GEN:  in no acute distress HEENT: Normal NECK: No JVD CARDIAC: RRR, 2/6 systolic heart murmur RESPIRATORY:  Clear to auscultation without rales, wheezing or rhonchi  ABDOMEN: Soft, non-tender, non-distended MUSCULOSKELETAL:  No edema; No deformity  SKIN: Warm and dry NEUROLOGIC:  Alert and oriented x 3 PSYCHIATRIC:  Normal affect   ASSESSMENT:    1. Cardiac murmur   2. Essential hypertension   3. Hyperlipidemia, unspecified hyperlipidemia type    PLAN:     Heart murmur: 2/6 systolic murmur.  Will check TTE  Hypertension: On losartan-hydrochlorothiazide 50-6.25 mg.  Appears well-controlled.  Hyperlipidemia: On atorvastatin 40 mg daily.  LDL 78 on 06/13/19  Type 2 diabetes on Metformin, Januvia, Jardiance.  A1c 7.5 on 12/19/19  Virtual f/u in 1 month  Medication Adjustments/Labs and Tests Ordered: Current medicines are reviewed at length with the patient today.  Concerns regarding medicines are outlined above.  Orders Placed This Encounter  Procedures  . EKG 12-Lead  . ECHOCARDIOGRAM COMPLETE   No  orders of the defined types were placed in this encounter.   Patient Instructions  Medication Instructions:  Your physician recommends that you continue on your current medications as directed. Please refer to the Current Medication list given to you today.  Lab Work: NONE  Testing/Procedures: Your physician has requested that you have an echocardiogram. Echocardiography is a painless test that uses sound waves to create images of your heart. It provides your doctor with information about the size  and shape of your heart and how well your heart's chambers and valves are working. This procedure takes approximately one hour. There are no restrictions for this procedure. This will be done at our [facility] location:  [place]: At [facility], you and your health needs are our priority.  As part of our continuing mission to provide you with exceptional heart care, we have created designated Provider Care Teams.  These Care Teams include your primary Cardiologist (physician) and Advanced Practice Providers (APPs -  Physician Assistants and Nurse Practitioners) who all work together to provide you with the care you need, when you need it.  We recommend signing up for the patient portal called "MyChart".  Sign up information is provided on this After Visit Summary.  MyChart is used to connect with patients for Virtual Visits (Telemedicine).  Patients are able to view lab/test results, encounter notes, upcoming appointments, etc.  Non-urgent messages can be sent to your provider as well.   To learn more about what you can do with MyChart, go to [email or web address].    Your next appointment:   1 month(s)  The format for your next appointment:   Virtual Visit   Provider:   Oswaldo Milian, MD        Signed, Donato Heinz, MD  02/18/2020 5:53 PM    [place]

## 2020-02-18 ENCOUNTER — Other Ambulatory Visit: Payer: Self-pay

## 2020-02-18 ENCOUNTER — Encounter: Payer: Self-pay | Admitting: Cardiology

## 2020-02-18 ENCOUNTER — Ambulatory Visit: Payer: Medicare Other | Admitting: Cardiology

## 2020-02-18 ENCOUNTER — Encounter (INDEPENDENT_AMBULATORY_CARE_PROVIDER_SITE_OTHER): Payer: Self-pay

## 2020-02-18 VITALS — BP 118/80 | HR 108 | Temp 95.9°F | Ht 63.5 in | Wt 156.0 lb

## 2020-02-18 DIAGNOSIS — R011 Cardiac murmur, unspecified: Secondary | ICD-10-CM | POA: Diagnosis not present

## 2020-02-18 DIAGNOSIS — E785 Hyperlipidemia, unspecified: Secondary | ICD-10-CM

## 2020-02-18 DIAGNOSIS — I10 Essential (primary) hypertension: Secondary | ICD-10-CM | POA: Diagnosis not present

## 2020-02-18 LAB — PAP IG W/ RFLX HPV ASCU

## 2020-02-18 NOTE — Patient Instructions (Signed)
Medication Instructions:  Your physician recommends that you continue on your current medications as directed. Please refer to the Current Medication list given to you today.  Lab Work: NONE  Testing/Procedures: Your physician has requested that you have an echocardiogram. Echocardiography is a painless test that uses sound waves to create images of your heart. It provides your doctor with information about the size and shape of your heart and how well your heart's chambers and valves are working. This procedure takes approximately one hour. There are no restrictions for this procedure. This will be done at our [facility] location:  [place]: At [facility], you and your health needs are our priority.  As part of our continuing mission to provide you with exceptional heart care, we have created designated Provider Care Teams.  These Care Teams include your primary Cardiologist (physician) and Advanced Practice Providers (APPs -  Physician Assistants and Nurse Practitioners) who all work together to provide you with the care you need, when you need it.  We recommend signing up for the patient portal called "MyChart".  Sign up information is provided on this After Visit Summary.  MyChart is used to connect with patients for Virtual Visits (Telemedicine).  Patients are able to view lab/test results, encounter notes, upcoming appointments, etc.  Non-urgent messages can be sent to your provider as well.   To learn more about what you can do with MyChart, go to [email or web address].    Your next appointment:   1 month(s)  The format for your next appointment:   Virtual Visit   Provider:   Oswaldo Milian, MD

## 2020-03-05 ENCOUNTER — Other Ambulatory Visit: Payer: Self-pay

## 2020-03-05 ENCOUNTER — Ambulatory Visit (HOSPITAL_COMMUNITY): Payer: Medicare Other | Attending: Cardiovascular Disease

## 2020-03-05 DIAGNOSIS — R011 Cardiac murmur, unspecified: Secondary | ICD-10-CM | POA: Diagnosis not present

## 2020-03-07 ENCOUNTER — Other Ambulatory Visit: Payer: Self-pay

## 2020-03-07 DIAGNOSIS — R011 Cardiac murmur, unspecified: Secondary | ICD-10-CM

## 2020-03-10 ENCOUNTER — Other Ambulatory Visit: Payer: Self-pay

## 2020-03-11 ENCOUNTER — Ambulatory Visit (INDEPENDENT_AMBULATORY_CARE_PROVIDER_SITE_OTHER): Payer: Medicare Other

## 2020-03-11 ENCOUNTER — Other Ambulatory Visit: Payer: Self-pay | Admitting: Obstetrics & Gynecology

## 2020-03-11 DIAGNOSIS — Z780 Asymptomatic menopausal state: Secondary | ICD-10-CM | POA: Diagnosis not present

## 2020-03-11 DIAGNOSIS — M8589 Other specified disorders of bone density and structure, multiple sites: Secondary | ICD-10-CM

## 2020-03-11 DIAGNOSIS — M8580 Other specified disorders of bone density and structure, unspecified site: Secondary | ICD-10-CM

## 2020-03-11 DIAGNOSIS — Z01419 Encounter for gynecological examination (general) (routine) without abnormal findings: Secondary | ICD-10-CM

## 2020-03-18 NOTE — Progress Notes (Signed)
Virtual Visit via Telephone Note   This visit type was conducted due to national recommendations for restrictions regarding the COVID-19 Pandemic (e.g. social distancing) in an effort to limit this patient's exposure and mitigate transmission in our community.  Due to her co-morbid illnesses, this patient is at least at moderate risk for complications without adequate follow up.  This format is felt to be most appropriate for this patient at this time.  The patient did not have access to video technology/had technical difficulties with video requiring transitioning to audio format only (telephone).  All issues noted in this document were discussed and addressed.  No physical exam could be performed with this format.  Please refer to the patient's chart for her  consent to telehealth for [facility].   Date:  03/20/2020   ID:  Sheila Keller, DOB [birth year], MRN [id number]  Patient Location: Home Provider Location: Office  PCP:  Maury Dus, MD  Cardiologist:  No primary care provider on file.  Electrophysiologist:  None   Evaluation Performed:  Follow-Up Visit  Chief Complaint:  Aortic stenosis  History of Present Illness:    Sheila Keller is a 74 y.o. female with a hx of hypertension, diabetes, hyperlipidemia who presents for follow-up.  She was referred by Dr. Delilah Shan for evaluation of heart murmur.  Noted at recent appointment with Dr Delilah Shan to have systolic heart murmur.  She denies any chest pain, dyspnea, palpitations, LE edema, lightheadeness, or syncope.  For exercise she walks around yard for 20 minutes.  She denies any exertional symptoms.  Never smoked.  Mother had MI in 25s.  TTE on 03/05/2020 showed hyperdynamic LV systolic function, mild concentric LVH, grade 1 diastolic dysfunction, normal RV function, normal PASP, mild to moderate aortic stenosis.  Since last clinic vist, she has been doing well.  She denies any chest pain, dyspnea, lightheadeness or syncope.  She  continues to exercise by walking around her yard.  Recently had her second Covid vaccine, so was planning on starting to walk at the park.   Past Medical History:  Diagnosis Date  . Breast lump 08/2017   no cancer/ [facility]  . Chronic cystitis   . Diabetes mellitus   . Elevated cholesterol   . Hypertension   . Mild dysplasia of cervix (CIN I)   . Osteopenia 02/2018   T score -1.8 FRAX 11% / 2.2%   Past Surgical History:  Procedure Laterality Date  . BREAST BIOPSY Left   . CATARACT EXTRACTION Bilateral    x2   . CERVICAL BIOPSY  W/ LOOP ELECTRODE EXCISION  2009  . CHOLECYSTECTOMY    . COLPOSCOPY    . DILATION AND CURETTAGE OF UTERUS    . TUBAL LIGATION    . vocal cord surgery     nodules excised     Current Meds  Medication Sig  . ALPRAZolam (XANAX) 0.5 MG tablet 1/2-1 TABLET TWICE A DAY, AS NEEDED FOR ANXIETY ORALLY  . aspirin 81 MG chewable tablet Chew 81 mg by mouth every morning.   Marland Kitchen atorvastatin (LIPITOR) 40 MG tablet Take 40 mg by mouth every morning.  . Cholecalciferol (VITAMIN D3) 2000 units TABS Take 1 capsule by mouth every morning.  . empagliflozin (JARDIANCE) 25 MG TABS tablet Take 25 mg by mouth daily.  Marland Kitchen losartan-hydrochlorothiazide (HYZAAR) 100-12.5 MG per tablet Take 0.5 tablets by mouth every morning. 1/2 TAB PO QD  . metFORMIN (GLUCOPHAGE) 500 MG tablet Take 2 tablets by  mouth 2 (two) times daily.  . sitaGLIPtin (JANUVIA) 100 MG tablet Take 100 mg by mouth daily.     Allergies:   Bee venom and Penicillins   Social History   Tobacco Use  . Smoking status: Never Smoker  . Smokeless tobacco: Never Used  Substance Use Topics  . Alcohol use: No    Alcohol/week: 0.0 standard drinks  . Drug use: No     Family Hx: The patient's family history includes Breast cancer in her maternal aunt and mother; Diabetes in her mother and son; Heart attack in her son; Heart disease in her mother; Hypertension in her mother; Lung cancer in her  father; Stroke in her mother.  ROS:   Please see the history of present illness.     All other systems reviewed and are negative.   Prior CV studies:   The following studies were reviewed today:  TTE 03/05/20:  1. Left ventricular ejection fraction, by estimation, is 70 to 75%. The  left ventricle has hyperdynamic function. The left ventricle has no  regional wall motion abnormalities. There is mild concentric left  ventricular hypertrophy. Left ventricular  diastolic parameters are consistent with Grade I diastolic dysfunction  (impaired relaxation).  2. Right ventricular systolic function is normal. The right ventricular  size is normal. There is normal pulmonary artery systolic pressure. The  estimated right ventricular systolic pressure is 0000000 mmHg.  3. The mitral valve is normal in structure. No evidence of mitral valve  regurgitation. No evidence of mitral stenosis.  4. The aortic valve is tricuspid. Aortic valve regurgitation is not  visualized. Mild to moderate aortic valve stenosis.  5. The inferior vena cava is normal in size with greater than 50%  respiratory variability, suggesting right atrial pressure of 3 mmHg.   Labs/Other Tests and Data Reviewed:    EKG:  No ECG reviewed.  Recent Labs: No results found for requested labs within last 8760 hours.   Recent Lipid Panel No results found for: CHOL, TRIG, HDL, CHOLHDL, LDLCALC, LDLDIRECT  Wt Readings from Last 3 Encounters:  03/20/20 148 lb (67.1 kg)  02/18/20 156 lb (70.8 kg)  02/14/20 154 lb (69.9 kg)     Objective:    Vital Signs:  BP 120/71   Ht 5' 3.5" (1.613 m)   Wt 148 lb (67.1 kg)   LMP  (LMP Unknown)   BMI 25.81 kg/m    VITAL SIGNS:  reviewed  Gen: no respiratory distress, able to converse comfortably  ASSESSMENT & PLAN:    Aortic stenosis: Mild to moderate on TTE 03/05/2020.  Will plan repeat TTE in 1 year  Hypertension: On losartan-hydrochlorothiazide 50-6.25 mg.  Appears  well-controlled.  Hyperlipidemia: On atorvastatin 40 mg daily.  LDL 78 on 06/13/19  Type 2 diabetes on Metformin, Januvia, Jardiance.  A1c 7.5 on 12/19/19   RTC in 1 year   Time:   Today, I have spent 8 minutes with the patient with telehealth technology discussing the above problems.     Medication Adjustments/Labs and Tests Ordered: Current medicines are reviewed at length with the patient today.  Concerns regarding medicines are outlined above.   Tests Ordered: No orders of the defined types were placed in this encounter.   Medication Changes: No orders of the defined types were placed in this encounter.   Follow Up:  In Person in 1 year(s)  Signed, Donato Heinz, MD  03/20/2020 5:35 PM    [facility]

## 2020-03-20 ENCOUNTER — Telehealth (INDEPENDENT_AMBULATORY_CARE_PROVIDER_SITE_OTHER): Payer: Medicare Other | Admitting: Cardiology

## 2020-03-20 ENCOUNTER — Encounter: Payer: Self-pay | Admitting: Cardiology

## 2020-03-20 VITALS — BP 120/71 | Ht 63.5 in | Wt 148.0 lb

## 2020-03-20 DIAGNOSIS — E785 Hyperlipidemia, unspecified: Secondary | ICD-10-CM

## 2020-03-20 DIAGNOSIS — I350 Nonrheumatic aortic (valve) stenosis: Secondary | ICD-10-CM

## 2020-03-20 DIAGNOSIS — I10 Essential (primary) hypertension: Secondary | ICD-10-CM | POA: Diagnosis not present

## 2020-03-20 DIAGNOSIS — E119 Type 2 diabetes mellitus without complications: Secondary | ICD-10-CM

## 2020-03-20 NOTE — Patient Instructions (Signed)
Medication Instructions:  Your physician recommends that you continue on your current medications as directed. Please refer to the Current Medication list given to you today.  *If you need a refill on your cardiac medications before your next appointment, please call your pharmacy*  Testing/Procedures: Your physician has requested that you have an echocardiogram in MARCH 2022. Echocardiography is a painless test that uses sound waves to create images of your heart. It provides your doctor with information about the size and shape of your heart and how well your heart's chambers and valves are working. This procedure takes approximately one hour. There are no restrictions for this procedure.  Follow-Up: At [facility], you and your health needs are our priority.  As part of our continuing mission to provide you with exceptional heart care, we have created designated Provider Care Teams.  These Care Teams include your primary Cardiologist (physician) and Advanced Practice Providers (APPs -  Physician Assistants and Nurse Practitioners) who all work together to provide you with the care you need, when you need it.  We recommend signing up for the patient portal called "MyChart".  Sign up information is provided on this After Visit Summary.  MyChart is used to connect with patients for Virtual Visits (Telemedicine).  Patients are able to view lab/test results, encounter notes, upcoming appointments, etc.  Non-urgent messages can be sent to your provider as well.   To learn more about what you can do with MyChart, go to [email or web address].    Your next appointment:   12 month(s)  The format for your next appointment:   In Person  Provider:   Oswaldo Milian, MD

## 2020-07-24 ENCOUNTER — Other Ambulatory Visit: Payer: Self-pay | Admitting: Family Medicine

## 2020-07-24 DIAGNOSIS — Z1231 Encounter for screening mammogram for malignant neoplasm of breast: Secondary | ICD-10-CM

## 2020-09-03 ENCOUNTER — Other Ambulatory Visit: Payer: Self-pay

## 2020-09-03 ENCOUNTER — Ambulatory Visit
Admission: RE | Admit: 2020-09-03 | Discharge: 2020-09-03 | Disposition: A | Discharge status: Home / Self Care | Payer: Medicare Other | Source: Ambulatory Visit | Attending: Family Medicine | Admitting: Family Medicine

## 2020-09-03 DIAGNOSIS — Z1231 Encounter for screening mammogram for malignant neoplasm of breast: Secondary | ICD-10-CM

## 2020-12-29 DIAGNOSIS — E1165 Type 2 diabetes mellitus with hyperglycemia: Secondary | ICD-10-CM | POA: Diagnosis not present

## 2020-12-29 DIAGNOSIS — I10 Essential (primary) hypertension: Secondary | ICD-10-CM | POA: Diagnosis not present

## 2020-12-29 DIAGNOSIS — E7800 Pure hypercholesterolemia, unspecified: Secondary | ICD-10-CM | POA: Diagnosis not present

## 2020-12-29 DIAGNOSIS — E119 Type 2 diabetes mellitus without complications: Secondary | ICD-10-CM | POA: Diagnosis not present

## 2020-12-29 DIAGNOSIS — M19041 Primary osteoarthritis, right hand: Secondary | ICD-10-CM | POA: Diagnosis not present

## 2020-12-29 DIAGNOSIS — M8580 Other specified disorders of bone density and structure, unspecified site: Secondary | ICD-10-CM | POA: Diagnosis not present

## 2021-01-13 DIAGNOSIS — E7800 Pure hypercholesterolemia, unspecified: Secondary | ICD-10-CM | POA: Diagnosis not present

## 2021-01-13 DIAGNOSIS — Z0000 Encounter for general adult medical examination without abnormal findings: Secondary | ICD-10-CM | POA: Diagnosis not present

## 2021-01-13 DIAGNOSIS — I10 Essential (primary) hypertension: Secondary | ICD-10-CM | POA: Diagnosis not present

## 2021-01-13 DIAGNOSIS — E1165 Type 2 diabetes mellitus with hyperglycemia: Secondary | ICD-10-CM | POA: Diagnosis not present

## 2021-01-13 DIAGNOSIS — Z1211 Encounter for screening for malignant neoplasm of colon: Secondary | ICD-10-CM | POA: Diagnosis not present

## 2021-01-13 DIAGNOSIS — M19041 Primary osteoarthritis, right hand: Secondary | ICD-10-CM | POA: Diagnosis not present

## 2021-01-13 DIAGNOSIS — Z1389 Encounter for screening for other disorder: Secondary | ICD-10-CM | POA: Diagnosis not present

## 2021-01-13 DIAGNOSIS — M8580 Other specified disorders of bone density and structure, unspecified site: Secondary | ICD-10-CM | POA: Diagnosis not present

## 2021-01-14 DIAGNOSIS — Z1211 Encounter for screening for malignant neoplasm of colon: Secondary | ICD-10-CM | POA: Diagnosis not present

## 2021-02-11 DIAGNOSIS — E1165 Type 2 diabetes mellitus with hyperglycemia: Secondary | ICD-10-CM | POA: Diagnosis not present

## 2021-02-11 DIAGNOSIS — E7800 Pure hypercholesterolemia, unspecified: Secondary | ICD-10-CM | POA: Diagnosis not present

## 2021-02-11 DIAGNOSIS — E119 Type 2 diabetes mellitus without complications: Secondary | ICD-10-CM | POA: Diagnosis not present

## 2021-02-11 DIAGNOSIS — I10 Essential (primary) hypertension: Secondary | ICD-10-CM | POA: Diagnosis not present

## 2021-02-11 DIAGNOSIS — M8580 Other specified disorders of bone density and structure, unspecified site: Secondary | ICD-10-CM | POA: Diagnosis not present

## 2021-02-11 DIAGNOSIS — M19041 Primary osteoarthritis, right hand: Secondary | ICD-10-CM | POA: Diagnosis not present

## 2021-02-17 ENCOUNTER — Encounter: Payer: Medicare Other | Admitting: Obstetrics and Gynecology

## 2021-02-18 ENCOUNTER — Ambulatory Visit: Payer: Medicare Other | Admitting: Obstetrics and Gynecology

## 2021-02-18 ENCOUNTER — Other Ambulatory Visit: Payer: Self-pay

## 2021-02-18 ENCOUNTER — Encounter: Payer: Self-pay | Admitting: Obstetrics and Gynecology

## 2021-02-18 VITALS — BP 122/68 | HR 104 | Ht 62.0 in | Wt 150.0 lb

## 2021-02-18 DIAGNOSIS — Z780 Asymptomatic menopausal state: Secondary | ICD-10-CM

## 2021-02-18 DIAGNOSIS — Z01419 Encounter for gynecological examination (general) (routine) without abnormal findings: Secondary | ICD-10-CM

## 2021-02-18 DIAGNOSIS — Z1239 Encounter for other screening for malignant neoplasm of breast: Secondary | ICD-10-CM

## 2021-02-18 DIAGNOSIS — M8580 Other specified disorders of bone density and structure, unspecified site: Secondary | ICD-10-CM

## 2021-02-18 DIAGNOSIS — K649 Unspecified hemorrhoids: Secondary | ICD-10-CM

## 2021-02-18 DIAGNOSIS — R011 Cardiac murmur, unspecified: Secondary | ICD-10-CM

## 2021-02-18 NOTE — Progress Notes (Signed)
GYNECOLOGY  VISIT   HPI: 75 y.o.   Divorced  Caucasian  female   G2P2002 with No LMP recorded (lmp unknown). Patient is postmenopausal.   here for breast and pelvic exam.    We do not currently prescribe any medications for patient.  Patient requesting pap today due to history of CIN I.  She wants to have me check her heart murmur. She saw cardiology.  She wants a check for hemorrhoids. Patient is going to have a colonoscopy for blood in the stool.  She received her Covid booster.   GYNECOLOGIC HISTORY: No LMP recorded (lmp unknown). Patient is postmenopausal. Contraception:  PMP Menopausal hormone therapy:  none Last mammogram:  09/03/20 - BI-RADS1, cat B Last pap smear: 02-14-20 Neg, 02-10-18 Neg, 02-09-17 Neg           OB History    Gravida  2   Para  2   Term  2   Preterm      AB      Living  2     SAB      IAB      Ectopic      Multiple      Live Births                 Patient Active Problem List   Diagnosis Date Noted   Osteopenia 01/14/2016   Intramural leiomyoma of uterus 09/30/2014   Type II or unspecified type diabetes mellitus without mention of complication, not stated as uncontrolled 09/19/2013   Hypertension    Elevated cholesterol     Past Medical History:  Diagnosis Date   Breast lump 08/2017   no cancer/ [facility]   Chronic cystitis    Diabetes mellitus    Elevated cholesterol    Heart murmur    Aortic valve stenosis.  Sees cardiologist with [facility]   Hypertension    Mild dysplasia of cervix (CIN I)    Osteopenia 02/2018   T score -1.8 FRAX 11% / 2.2%    Past Surgical History:  Procedure Laterality Date   BREAST BIOPSY Left    CATARACT EXTRACTION Bilateral    x2    CERVICAL BIOPSY  W/ LOOP ELECTRODE EXCISION  2009   CHOLECYSTECTOMY     COLPOSCOPY     DILATION AND CURETTAGE OF UTERUS     TUBAL LIGATION     vocal cord surgery     nodules excised    Current Outpatient Medications   Medication Sig Dispense Refill   ALPRAZolam (XANAX) 0.5 MG tablet 1/2-1 TABLET TWICE A DAY, AS NEEDED FOR ANXIETY ORALLY  1   aspirin 81 MG chewable tablet Chew 81 mg by mouth every morning.     atorvastatin (LIPITOR) 40 MG tablet Take 40 mg by mouth every morning.     Cholecalciferol (VITAMIN D3) 2000 units TABS Take 1 capsule by mouth every morning.     empagliflozin (JARDIANCE) 25 MG TABS tablet Take 25 mg by mouth daily.     glucose blood (ONETOUCH VERIO) test strip for use when checking blood sugars     losartan-hydrochlorothiazide (HYZAAR) 100-12.5 MG per tablet Take 0.5 tablets by mouth every morning. 1/2 TAB PO QD     metFORMIN (GLUCOPHAGE) 500 MG tablet Take 2 tablets by mouth 2 (two) times daily.  1   sitaGLIPtin (JANUVIA) 100 MG tablet Take 100 mg by mouth daily.     No current facility-administered medications for this visit.  ALLERGIES: Bee venom, Dapagliflozin, and Penicillins  Family History  Problem Relation Age of Onset   Diabetes Mother    Hypertension Mother    Heart disease Mother    Stroke Mother    Breast cancer Mother        Age 69   Lung cancer Father    Breast cancer Maternal Aunt        Age 40's   Diabetes Son    Heart attack Son     Social History   Socioeconomic History   Marital status: Divorced    Spouse name: Not on file   Number of children: Not on file   Years of education: Not on file   Highest education level: Not on file  Occupational History   Not on file  Tobacco Use   Smoking status: Never Smoker   Smokeless tobacco: Never Used  Vaping Use   Vaping Use: Never used  Substance and Sexual Activity   Alcohol use: No    Alcohol/week: 0.0 standard drinks   Drug use: No   Sexual activity: Never    Birth control/protection: Post-menopausal, Surgical    Comment: First sexual encounter at age 56. Less than 5 partners in her life.  Other Topics Concern   Not on file  Social History Narrative   Not  on file   Social Determinants of Health   Financial Resource Strain: Not on file  Food Insecurity: Not on file  Transportation Needs: Not on file  Physical Activity: Not on file  Stress: Not on file  Social Connections: Not on file  Intimate Partner Violence: Not on file    Review of Systems  All other systems reviewed and are negative.   PHYSICAL EXAMINATION:    BP 122/68    Pulse (!) 104    Ht 5\' 2"  (1.575 m)    Wt 150 lb (68 kg)    LMP  (LMP Unknown)    SpO2 98%    BMI 27.44 kg/m     General appearance: alert, cooperative and appears stated age Head: Normocephalic, without obvious abnormality, atraumatic Lungs: clear to auscultation bilaterally Breasts: normal appearance, no masses or tenderness, No nipple retraction or dimpling, No nipple discharge or bleeding, No axillary or supraclavicular adenopathy Heart: regular rate and rhythm.  Systolic murmur noted.  Abdomen: soft, non-tender, no masses,  no organomegaly Skin: Skin color, texture, turgor normal. No rashes or lesions No abnormal inguinal nodes palpated   Pelvic: External genitalia:  no lesions              Urethra:  normal appearing urethra with no masses, tenderness or lesions              Bartholins and Skenes: normal                 Vagina: normal appearing vagina with normal color and discharge, no lesions              Cervix: no lesions                Bimanual Exam:  Uterus:  normal size, contour, position, consistency, mobility, non-tender              Adnexa: no mass, fullness, tenderness              Rectal exam: Yes.  .  Confirms.              Anus:  normal sphincter tone, external hemorrhoid.  Chaperone  was present for exam.  ASSESSMENT  Hx LEEP 2009.  CIN I on colpo biopsy and final LEEP pathology negative.   Follow up paps normal. Screening breast exam. FH breast cancer.  Pelvic exam without abnormal finding.  Hemorrhoid formation.  Osteopenia.  Menopausal female. Cardiac murmur.  FH  CAD.  PLAN  Pap next year.  Self breast exam reviewed. Mammogram in Sept. BMD in 2023.  Calcium and vitamin D recommendations reviewed.  Weight bearing exercise recommended. She will see cardiology for a repeat ECHO.  Colonoscopy.  Fu in one year.   31 min  total time was spent for this patient encounter, including preparation, face-to-face counseling with the patient, coordination of care, and documentation of the encounter.

## 2021-02-18 NOTE — Patient Instructions (Signed)

## 2021-02-19 ENCOUNTER — Encounter: Payer: Self-pay | Admitting: Obstetrics and Gynecology

## 2021-03-15 DIAGNOSIS — S6991XA Unspecified injury of right wrist, hand and finger(s), initial encounter: Secondary | ICD-10-CM | POA: Diagnosis not present

## 2021-03-17 ENCOUNTER — Other Ambulatory Visit: Payer: Self-pay

## 2021-03-17 ENCOUNTER — Ambulatory Visit (HOSPITAL_COMMUNITY): Payer: Medicare Other | Attending: Internal Medicine

## 2021-03-17 DIAGNOSIS — R011 Cardiac murmur, unspecified: Secondary | ICD-10-CM | POA: Insufficient documentation

## 2021-03-17 LAB — ECHOCARDIOGRAM COMPLETE
AR max vel: 0.88 cm2
AV Area VTI: 0.95 cm2
AV Area mean vel: 0.91 cm2
AV Mean grad: 20 mmHg
AV Peak grad: 35.8 mmHg
Ao pk vel: 2.99 m/s
Area-P 1/2: 3.02 cm2
S' Lateral: 2.2 cm

## 2021-03-24 DIAGNOSIS — M79644 Pain in right finger(s): Secondary | ICD-10-CM | POA: Diagnosis not present

## 2021-03-24 DIAGNOSIS — M151 Heberden's nodes (with arthropathy): Secondary | ICD-10-CM | POA: Diagnosis not present

## 2021-03-29 NOTE — Progress Notes (Deleted)
Cardiology Office Note:    Date:  03/29/2021   ID:  Sheila Keller, DOB 02/06/1946, MRN [id number]  PCP:  Maury Dus, MD  Cardiologist:  No primary care provider on file.  Electrophysiologist:  None   Referring MD: Maury Dus, MD   No chief complaint on file.   History of Present Illness:    Sheila Keller is a 75 y.o. female with a hx of aortic stenosis, hypertension, diabetes, hyperlipidemia who presents for follow-up.  She was referred by Dr. Delilah Shan for evaluation of heart murmur.She denied anychest pain, dyspnea, palpitations, LE edema, lightheadeness, orsyncope. For exercise she walks around yard for 20 minutes.She denies any exertional symptoms.Never smoked. Mother had MI in 25s.  TTE on 03/05/2020 showed hyperdynamic LV systolic function, mild concentric LVH, grade 1 diastolic dysfunction, normal RV function, normal PASP, mild to moderate aortic stenosis.  Echocardiogram 03/17/2021 showed LVEF 70 to 50%, grade 1 diastolic dysfunction, normal RV function, moderate aortic stenosis.  Since last clinic vist,   she has been doing well.  She denies any chest pain, dyspnea, lightheadeness or syncope.  She continues to exercise by walking around her yard.  Recently had her second Covid vaccine, so was planning on starting to walk at the park.   Past Medical History:  Diagnosis Date  . Breast lump 08/2017   no cancer/ [facility]  . Chronic cystitis   . Diabetes mellitus   . Elevated cholesterol   . Heart murmur    Aortic valve stenosis.  Sees cardiologist with [facility]  . Hypertension   . Mild dysplasia of cervix (CIN I)   . Osteopenia 02/2018   T score -1.8 FRAX 11% / 2.2%    Past Surgical History:  Procedure Laterality Date  . BREAST BIOPSY Left   . CATARACT EXTRACTION Bilateral    x2   . CERVICAL BIOPSY  W/ LOOP ELECTRODE EXCISION  2009  . CHOLECYSTECTOMY    . COLPOSCOPY    . DILATION AND CURETTAGE OF UTERUS    . TUBAL LIGATION    .  vocal cord surgery     nodules excised    Current Medications: No outpatient medications have been marked as taking for the 04/01/21 encounter (Appointment) with Donato Heinz, MD.     Allergies:   Bee venom, Dapagliflozin, and Penicillins   Social History   Socioeconomic History  . Marital status: Divorced    Spouse name: Not on file  . Number of children: Not on file  . Years of education: Not on file  . Highest education level: Not on file  Occupational History  . Not on file  Tobacco Use  . Smoking status: Never Smoker  . Smokeless tobacco: Never Used  Vaping Use  . Vaping Use: Never used  Substance and Sexual Activity  . Alcohol use: No    Alcohol/week: 0.0 standard drinks  . Drug use: No  . Sexual activity: Never    Birth control/protection: Post-menopausal, Surgical    Comment: First sexual encounter at age 58. Less than 5 partners in her life.  Other Topics Concern  . Not on file  Social History Narrative  . Not on file   Social Determinants of Health   Financial Resource Strain: Not on file  Food Insecurity: Not on file  Transportation Needs: Not on file  Physical Activity: Not on file  Stress: Not on file  Social Connections: Not on file     Family History: The patient's family  history includes Breast cancer in her maternal aunt and mother; Diabetes in her mother and son; Heart attack in her son; Heart disease in her mother; Hypertension in her mother; Lung cancer in her father; Stroke in her mother.  ROS:   Please see the history of present illness.     All other systems reviewed and are negative.  EKGs/Labs/Other Studies Reviewed:    The following studies were reviewed today:   EKG:  EKG is ordered today.  The ekg ordered today demonstrates normal sinus rhythm, rate 108, right axis deviation, no ST abnormalities, Q waves in III, aVF  Recent Labs: No results found for requested labs within last 8760 hours.  Recent Lipid Panel No  results found for: CHOL, TRIG, HDL, CHOLHDL, VLDL, LDLCALC, LDLDIRECT  Physical Exam:    VS:  LMP  (LMP Unknown)     Wt Readings from Last 3 Encounters:  02/18/21 150 lb (68 kg)  03/20/20 148 lb (67.1 kg)  02/18/20 156 lb (70.8 kg)     GEN:  in no acute distress HEENT: Normal NECK: No JVD CARDIAC: RRR, 2/6 systolic heart murmur RESPIRATORY:  Clear to auscultation without rales, wheezing or rhonchi  ABDOMEN: Soft, non-tender, non-distended MUSCULOSKELETAL:  No edema; No deformity  SKIN: Warm and dry NEUROLOGIC:  Alert and oriented x 3 PSYCHIATRIC:  Normal affect   ASSESSMENT:    No diagnosis found. PLAN:     Aortic stenosis: Echocardiogram 03/17/2021 showed moderate aortic stenosis (V-max 3 m/s, mean gradient 20 mmHg, AVA 1.0 cm, DI 0.3) -Repeat echocardiogram in 1 year to monitor  Hypertension: On losartan-hydrochlorothiazide 50-6.25 mg.  Appears well-controlled.  Hyperlipidemia: On atorvastatin 40 mg daily.  LDL 66 01/13/21  Type 2 diabetes: On Metformin, Januvia, Jardiance.  A1c 6.5 on 12/19/19   RTC in***  Medication Adjustments/Labs and Tests Ordered: Current medicines are reviewed at length with the patient today.  Concerns regarding medicines are outlined above.  No orders of the defined types were placed in this encounter.  No orders of the defined types were placed in this encounter.   There are no Patient Instructions on file for this visit.   Signed, Donato Heinz, MD  03/29/2021 3:51 PM    [facility]

## 2021-04-01 ENCOUNTER — Encounter: Payer: Self-pay | Admitting: Cardiology

## 2021-04-01 ENCOUNTER — Ambulatory Visit: Payer: Medicare Other | Admitting: Cardiology

## 2021-04-01 ENCOUNTER — Other Ambulatory Visit: Payer: Self-pay

## 2021-04-01 VITALS — BP 124/78 | HR 88 | Ht 63.0 in | Wt 151.2 lb

## 2021-04-01 DIAGNOSIS — I350 Nonrheumatic aortic (valve) stenosis: Secondary | ICD-10-CM | POA: Diagnosis not present

## 2021-04-01 DIAGNOSIS — I10 Essential (primary) hypertension: Secondary | ICD-10-CM | POA: Diagnosis not present

## 2021-04-01 DIAGNOSIS — E785 Hyperlipidemia, unspecified: Secondary | ICD-10-CM

## 2021-04-01 NOTE — Patient Instructions (Signed)
Medication Instructions:  Your physician recommends that you continue on your current medications as directed. Please refer to the Current Medication list given to you today.  *If you need a refill on your cardiac medications before your next appointment, please call your pharmacy*   Lab Work: None ordered   Testing/Procedures: Your physician has requested that you have an echocardiogram. Echocardiography is a painless test that uses sound waves to create images of your heart. It provides your doctor with information about the size and shape of your heart and how well your heart's chambers and valves are working. This procedure takes approximately one hour. There are no restrictions for this procedure.    Follow-Up: At [facility], you and your health needs are our priority.  As part of our continuing mission to provide you with exceptional heart care, we have created designated Provider Care Teams.  These Care Teams include your primary Cardiologist (physician) and Advanced Practice Providers (APPs -  Physician Assistants and Nurse Practitioners) who all work together to provide you with the care you need, when you need it.  We recommend signing up for the patient portal called "MyChart".  Sign up information is provided on this After Visit Summary.  MyChart is used to connect with patients for Virtual Visits (Telemedicine).  Patients are able to view lab/test results, encounter notes, upcoming appointments, etc.  Non-urgent messages can be sent to your provider as well.   To learn more about what you can do with MyChart, go to [email or web address].    Your next appointment:   12 month(s)  The format for your next appointment:   In Person  Provider:   Oswaldo Milian, MD

## 2021-04-01 NOTE — Progress Notes (Signed)
Cardiology Office Note:    Date:  04/05/2021   ID:  Sheila Keller, DOB [birth year], MRN [id number]  PCP:  Maury Dus, MD  Cardiologist:  No primary care provider on file.  Electrophysiologist:  None   Referring MD: Maury Dus, MD   Chief Complaint  Patient presents with  . Aortic Stenosis    History of Present Illness:    Sheila Keller is a 75 y.o. female with a hx of aortic stenosis, hypertension, diabetes, hyperlipidemia who presents for follow-up.  She was referred by Dr. Delilah Shan for evaluation of heart murmur.She denied anychest pain, dyspnea, palpitations, LE edema, lightheadeness, orsyncope. For exercise she walks around yard for 20 minutes.She denies any exertional symptoms.Never smoked. Mother had MI in 95s.  TTE on 03/05/2020 showed hyperdynamic LV systolic function, mild concentric LVH, grade 1 diastolic dysfunction, normal RV function, normal PASP, mild to moderate aortic stenosis.  Echocardiogram 03/17/2021 showed LVEF 70 to 74%, grade 1 diastolic dysfunction, normal RV function, moderate aortic stenosis.  Since last clinic vist, she reports not having any chest pains or shortness of breath. No issues with lightheadedness or syncope. She denies any palpitations or LE edema. She is trying to stay active with gardening around her home, but does not participate in any other formal exercise. She is afraid to go out to walk due to a neighbor's dog, but is willing to try walking at the park.     Past Medical History:  Diagnosis Date  . Breast lump 08/2017   no cancer/ [facility]  . Chronic cystitis   . Diabetes mellitus   . Elevated cholesterol   . Heart murmur    Aortic valve stenosis.  Sees cardiologist with [facility]  . Hypertension   . Mild dysplasia of cervix (CIN I)   . Osteopenia 02/2018   T score -1.8 FRAX 11% / 2.2%    Past Surgical History:  Procedure Laterality Date  . BREAST BIOPSY Left   . CATARACT EXTRACTION Bilateral     x2   . CERVICAL BIOPSY  W/ LOOP ELECTRODE EXCISION  2009  . CHOLECYSTECTOMY    . COLPOSCOPY    . DILATION AND CURETTAGE OF UTERUS    . TUBAL LIGATION    . vocal cord surgery     nodules excised    Current Medications: Current Meds  Medication Sig  . ALPRAZolam (XANAX) 0.5 MG tablet 1/2-1 TABLET TWICE A DAY, AS NEEDED FOR ANXIETY ORALLY  . aspirin 81 MG chewable tablet Chew 81 mg by mouth every morning.  Marland Kitchen atorvastatin (LIPITOR) 40 MG tablet Take 40 mg by mouth every morning.  . Cholecalciferol (VITAMIN D3) 2000 units TABS Take 1 capsule by mouth every morning.  . empagliflozin (JARDIANCE) 25 MG TABS tablet Take 25 mg by mouth daily.  Marland Kitchen glucose blood (ONETOUCH VERIO) test strip for use when checking blood sugars  . losartan-hydrochlorothiazide (HYZAAR) 100-12.5 MG per tablet Take 0.5 tablets by mouth every morning. 1/2 TAB PO QD  . metFORMIN (GLUCOPHAGE) 500 MG tablet Take 2 tablets by mouth 2 (two) times daily.  . sitaGLIPtin (JANUVIA) 100 MG tablet Take 100 mg by mouth daily.     Allergies:   Bee venom, Dapagliflozin, and Penicillins   Social History   Socioeconomic History  . Marital status: Divorced    Spouse name: Not on file  . Number of children: Not on file  . Years of education: Not on file  . Highest education level: Not on file  Occupational History  . Not on file  Tobacco Use  . Smoking status: Never Smoker  . Smokeless tobacco: Never Used  Vaping Use  . Vaping Use: Never used  Substance and Sexual Activity  . Alcohol use: No    Alcohol/week: 0.0 standard drinks  . Drug use: No  . Sexual activity: Never    Birth control/protection: Post-menopausal, Surgical    Comment: First sexual encounter at age 41. Less than 5 partners in her life.  Other Topics Concern  . Not on file  Social History Narrative  . Not on file   Social Determinants of Health   Financial Resource Strain: Not on file  Food Insecurity: Not on file  Transportation Needs: Not on file   Physical Activity: Not on file  Stress: Not on file  Social Connections: Not on file     Family History: The patient's family history includes Breast cancer in her maternal aunt and mother; Diabetes in her mother and son; Heart attack in her son; Heart disease in her mother; Hypertension in her mother; Lung cancer in her father; Stroke in her mother.  ROS:   Please see the history of present illness.    All other systems reviewed and are negative.  EKGs/Labs/Other Studies Reviewed:    The following studies were reviewed today:   EKG:    02/18/2020: normal sinus rhythm, rate 108, right axis deviation, no ST abnormalities, Q waves in III, aVF 04/01/2021: Sinus rhythm. Rate 88 bpm. No ST abnormalities.  Recent Labs: No results found for requested labs within last 8760 hours.  Recent Lipid Panel No results found for: CHOL, TRIG, HDL, CHOLHDL, VLDL, LDLCALC, LDLDIRECT  Physical Exam:    VS:  BP 124/78   Pulse 88   Ht 5\' 3"  (1.6 m)   Wt 151 lb 3.2 oz (68.6 kg)   LMP  (LMP Unknown)   SpO2 98%   BMI 26.78 kg/m     Wt Readings from Last 3 Encounters:  04/01/21 151 lb 3.2 oz (68.6 kg)  02/18/21 150 lb (68 kg)  03/20/20 148 lb (67.1 kg)     GEN:  in no acute distress HEENT: Normal NECK: No JVD CARDIAC: RRR, 3/6 systolic heart murmur loudest at the RUSB RESPIRATORY:  Clear to auscultation without rales, wheezing or rhonchi  ABDOMEN: Soft, non-tender, non-distended MUSCULOSKELETAL:  No edema; No deformity  SKIN: Warm and dry NEUROLOGIC:  Alert and oriented x 3 PSYCHIATRIC:  Normal affect   ASSESSMENT:    1. Aortic valve stenosis, etiology of cardiac valve disease unspecified   2. Essential hypertension   3. Hyperlipidemia, unspecified hyperlipidemia type    PLAN:     Aortic stenosis: Echocardiogram 03/17/2021 showed moderate aortic stenosis (V-max 3 m/s, mean gradient 20 mmHg, AVA 1.0 cm, DI 0.3) -Repeat echocardiogram in 1 year to monitor  Hypertension: On  losartan-hydrochlorothiazide 50-6.25 mg.  Appears well-controlled.  Hyperlipidemia: On atorvastatin 40 mg daily.  LDL 78 on 06/13/19  Type 2 diabetes: On Metformin, Januvia, Jardiance.  A1c 7.5 on 12/19/19   RTC in 1 year.  Medication Adjustments/Labs and Tests Ordered: Current medicines are reviewed at length with the patient today.  Concerns regarding medicines are outlined above.  Orders Placed This Encounter  Procedures  . EKG 12-Lead  . ECHOCARDIOGRAM COMPLETE   No orders of the defined types were placed in this encounter.   Patient Instructions  Medication Instructions:  Your physician recommends that you continue on your current medications as directed. Please refer  to the Current Medication list given to you today.  *If you need a refill on your cardiac medications before your next appointment, please call your pharmacy*   Lab Work: None ordered   Testing/Procedures: Your physician has requested that you have an echocardiogram. Echocardiography is a painless test that uses sound waves to create images of your heart. It provides your doctor with information about the size and shape of your heart and how well your heart's chambers and valves are working. This procedure takes approximately one hour. There are no restrictions for this procedure.    Follow-Up: At [facility], you and your health needs are our priority.  As part of our continuing mission to provide you with exceptional heart care, we have created designated Provider Care Teams.  These Care Teams include your primary Cardiologist (physician) and Advanced Practice Providers (APPs -  Physician Assistants and Nurse Practitioners) who all work together to provide you with the care you need, when you need it.  We recommend signing up for the patient portal called "MyChart".  Sign up information is provided on this After Visit Summary.  MyChart is used to connect with patients for Virtual Visits (Telemedicine).   Patients are able to view lab/test results, encounter notes, upcoming appointments, etc.  Non-urgent messages can be sent to your provider as well.   To learn more about what you can do with MyChart, go to [email or web address].    Your next appointment:   12 month(s)  The format for your next appointment:   In Person  Provider:   Oswaldo Milian, MD        [facility] Stumpf,acting as a scribe for Donato Heinz, MD.,have documented all relevant documentation on the behalf of Donato Heinz, MD,as directed by  Donato Heinz, MD while in the presence of Donato Heinz, MD.    Signed, Donato Heinz, MD  04/05/2021 11:34 PM    [place]

## 2021-04-08 DIAGNOSIS — M720 Palmar fascial fibromatosis [Dupuytren]: Secondary | ICD-10-CM | POA: Diagnosis not present

## 2021-04-08 DIAGNOSIS — M13849 Other specified arthritis, unspecified hand: Secondary | ICD-10-CM | POA: Diagnosis not present

## 2021-04-08 DIAGNOSIS — M79644 Pain in right finger(s): Secondary | ICD-10-CM | POA: Diagnosis not present

## 2021-04-10 DIAGNOSIS — E119 Type 2 diabetes mellitus without complications: Secondary | ICD-10-CM | POA: Diagnosis not present

## 2021-04-10 DIAGNOSIS — M19041 Primary osteoarthritis, right hand: Secondary | ICD-10-CM | POA: Diagnosis not present

## 2021-04-10 DIAGNOSIS — E1165 Type 2 diabetes mellitus with hyperglycemia: Secondary | ICD-10-CM | POA: Diagnosis not present

## 2021-04-10 DIAGNOSIS — M8580 Other specified disorders of bone density and structure, unspecified site: Secondary | ICD-10-CM | POA: Diagnosis not present

## 2021-04-10 DIAGNOSIS — E7800 Pure hypercholesterolemia, unspecified: Secondary | ICD-10-CM | POA: Diagnosis not present

## 2021-04-10 DIAGNOSIS — I10 Essential (primary) hypertension: Secondary | ICD-10-CM | POA: Diagnosis not present

## 2021-04-15 DIAGNOSIS — R195 Other fecal abnormalities: Secondary | ICD-10-CM | POA: Diagnosis not present

## 2021-05-20 DIAGNOSIS — M13849 Other specified arthritis, unspecified hand: Secondary | ICD-10-CM | POA: Diagnosis not present

## 2021-05-20 DIAGNOSIS — M79644 Pain in right finger(s): Secondary | ICD-10-CM | POA: Diagnosis not present

## 2021-06-02 DIAGNOSIS — I10 Essential (primary) hypertension: Secondary | ICD-10-CM | POA: Diagnosis not present

## 2021-06-02 DIAGNOSIS — M19041 Primary osteoarthritis, right hand: Secondary | ICD-10-CM | POA: Diagnosis not present

## 2021-06-02 DIAGNOSIS — E7800 Pure hypercholesterolemia, unspecified: Secondary | ICD-10-CM | POA: Diagnosis not present

## 2021-06-02 DIAGNOSIS — M8580 Other specified disorders of bone density and structure, unspecified site: Secondary | ICD-10-CM | POA: Diagnosis not present

## 2021-06-02 DIAGNOSIS — E1165 Type 2 diabetes mellitus with hyperglycemia: Secondary | ICD-10-CM | POA: Diagnosis not present

## 2021-06-02 DIAGNOSIS — E119 Type 2 diabetes mellitus without complications: Secondary | ICD-10-CM | POA: Diagnosis not present

## 2021-06-04 DIAGNOSIS — H6123 Impacted cerumen, bilateral: Secondary | ICD-10-CM | POA: Diagnosis not present

## 2021-07-15 DIAGNOSIS — E7800 Pure hypercholesterolemia, unspecified: Secondary | ICD-10-CM | POA: Diagnosis not present

## 2021-07-15 DIAGNOSIS — Z1211 Encounter for screening for malignant neoplasm of colon: Secondary | ICD-10-CM | POA: Diagnosis not present

## 2021-07-15 DIAGNOSIS — I10 Essential (primary) hypertension: Secondary | ICD-10-CM | POA: Diagnosis not present

## 2021-07-15 DIAGNOSIS — M19041 Primary osteoarthritis, right hand: Secondary | ICD-10-CM | POA: Diagnosis not present

## 2021-07-15 DIAGNOSIS — M8580 Other specified disorders of bone density and structure, unspecified site: Secondary | ICD-10-CM | POA: Diagnosis not present

## 2021-07-15 DIAGNOSIS — E1165 Type 2 diabetes mellitus with hyperglycemia: Secondary | ICD-10-CM | POA: Diagnosis not present

## 2021-07-29 ENCOUNTER — Other Ambulatory Visit: Payer: Self-pay | Admitting: Family Medicine

## 2021-07-29 DIAGNOSIS — Z1231 Encounter for screening mammogram for malignant neoplasm of breast: Secondary | ICD-10-CM

## 2021-08-11 DIAGNOSIS — Z23 Encounter for immunization: Secondary | ICD-10-CM | POA: Diagnosis not present

## 2021-08-11 DIAGNOSIS — R059 Cough, unspecified: Secondary | ICD-10-CM | POA: Diagnosis not present

## 2021-08-11 DIAGNOSIS — Z7984 Long term (current) use of oral hypoglycemic drugs: Secondary | ICD-10-CM | POA: Diagnosis not present

## 2021-08-11 DIAGNOSIS — Z7982 Long term (current) use of aspirin: Secondary | ICD-10-CM | POA: Diagnosis not present

## 2021-08-11 DIAGNOSIS — Z91030 Bee allergy status: Secondary | ICD-10-CM | POA: Diagnosis not present

## 2021-08-11 DIAGNOSIS — I10 Essential (primary) hypertension: Secondary | ICD-10-CM | POA: Diagnosis not present

## 2021-08-11 DIAGNOSIS — I959 Hypotension, unspecified: Secondary | ICD-10-CM | POA: Diagnosis not present

## 2021-08-11 DIAGNOSIS — R0981 Nasal congestion: Secondary | ICD-10-CM | POA: Diagnosis not present

## 2021-08-11 DIAGNOSIS — U071 COVID-19: Secondary | ICD-10-CM | POA: Diagnosis not present

## 2021-08-11 DIAGNOSIS — R5383 Other fatigue: Secondary | ICD-10-CM | POA: Diagnosis not present

## 2021-08-11 DIAGNOSIS — R509 Fever, unspecified: Secondary | ICD-10-CM | POA: Diagnosis not present

## 2021-08-11 DIAGNOSIS — R000 Tachycardia, unspecified: Secondary | ICD-10-CM | POA: Diagnosis not present

## 2021-08-11 DIAGNOSIS — R011 Cardiac murmur, unspecified: Secondary | ICD-10-CM | POA: Diagnosis not present

## 2021-08-11 DIAGNOSIS — Z79899 Other long term (current) drug therapy: Secondary | ICD-10-CM | POA: Diagnosis not present

## 2021-08-11 DIAGNOSIS — Z888 Allergy status to other drugs, medicaments and biological substances status: Secondary | ICD-10-CM | POA: Diagnosis not present

## 2021-08-11 DIAGNOSIS — R531 Weakness: Secondary | ICD-10-CM | POA: Diagnosis not present

## 2021-08-11 DIAGNOSIS — E119 Type 2 diabetes mellitus without complications: Secondary | ICD-10-CM | POA: Diagnosis not present

## 2021-08-19 DIAGNOSIS — I10 Essential (primary) hypertension: Secondary | ICD-10-CM | POA: Diagnosis not present

## 2021-08-19 DIAGNOSIS — E7800 Pure hypercholesterolemia, unspecified: Secondary | ICD-10-CM | POA: Diagnosis not present

## 2021-09-09 ENCOUNTER — Other Ambulatory Visit: Payer: Self-pay

## 2021-09-09 ENCOUNTER — Ambulatory Visit
Admission: RE | Admit: 2021-09-09 | Discharge: 2021-09-09 | Disposition: A | Discharge status: Home / Self Care | Payer: Medicare Other | Source: Ambulatory Visit | Attending: Family Medicine | Admitting: Family Medicine

## 2021-09-09 DIAGNOSIS — Z1231 Encounter for screening mammogram for malignant neoplasm of breast: Secondary | ICD-10-CM

## 2021-09-29 DIAGNOSIS — E7800 Pure hypercholesterolemia, unspecified: Secondary | ICD-10-CM | POA: Diagnosis not present

## 2021-09-29 DIAGNOSIS — E1165 Type 2 diabetes mellitus with hyperglycemia: Secondary | ICD-10-CM | POA: Diagnosis not present

## 2021-09-29 DIAGNOSIS — M8580 Other specified disorders of bone density and structure, unspecified site: Secondary | ICD-10-CM | POA: Diagnosis not present

## 2021-09-29 DIAGNOSIS — M19041 Primary osteoarthritis, right hand: Secondary | ICD-10-CM | POA: Diagnosis not present

## 2021-09-29 DIAGNOSIS — I10 Essential (primary) hypertension: Secondary | ICD-10-CM | POA: Diagnosis not present

## 2021-09-29 DIAGNOSIS — E119 Type 2 diabetes mellitus without complications: Secondary | ICD-10-CM | POA: Diagnosis not present

## 2021-10-13 DIAGNOSIS — Z23 Encounter for immunization: Secondary | ICD-10-CM | POA: Diagnosis not present

## 2021-11-13 DIAGNOSIS — H26492 Other secondary cataract, left eye: Secondary | ICD-10-CM | POA: Diagnosis not present

## 2021-11-13 DIAGNOSIS — Z961 Presence of intraocular lens: Secondary | ICD-10-CM | POA: Diagnosis not present

## 2021-11-13 DIAGNOSIS — H18413 Arcus senilis, bilateral: Secondary | ICD-10-CM | POA: Diagnosis not present

## 2021-11-13 DIAGNOSIS — H04123 Dry eye syndrome of bilateral lacrimal glands: Secondary | ICD-10-CM | POA: Diagnosis not present

## 2021-12-02 DIAGNOSIS — M19041 Primary osteoarthritis, right hand: Secondary | ICD-10-CM | POA: Diagnosis not present

## 2021-12-02 DIAGNOSIS — I10 Essential (primary) hypertension: Secondary | ICD-10-CM | POA: Diagnosis not present

## 2021-12-02 DIAGNOSIS — M8580 Other specified disorders of bone density and structure, unspecified site: Secondary | ICD-10-CM | POA: Diagnosis not present

## 2021-12-02 DIAGNOSIS — E119 Type 2 diabetes mellitus without complications: Secondary | ICD-10-CM | POA: Diagnosis not present

## 2021-12-02 DIAGNOSIS — E7800 Pure hypercholesterolemia, unspecified: Secondary | ICD-10-CM | POA: Diagnosis not present

## 2021-12-02 DIAGNOSIS — E1165 Type 2 diabetes mellitus with hyperglycemia: Secondary | ICD-10-CM | POA: Diagnosis not present

## 2021-12-08 ENCOUNTER — Other Ambulatory Visit: Payer: Self-pay | Admitting: Family Medicine

## 2021-12-08 DIAGNOSIS — Z1231 Encounter for screening mammogram for malignant neoplasm of breast: Secondary | ICD-10-CM

## 2021-12-14 DIAGNOSIS — E7800 Pure hypercholesterolemia, unspecified: Secondary | ICD-10-CM | POA: Diagnosis not present

## 2021-12-14 DIAGNOSIS — E119 Type 2 diabetes mellitus without complications: Secondary | ICD-10-CM | POA: Diagnosis not present

## 2021-12-14 DIAGNOSIS — I10 Essential (primary) hypertension: Secondary | ICD-10-CM | POA: Diagnosis not present

## 2021-12-14 DIAGNOSIS — E1165 Type 2 diabetes mellitus with hyperglycemia: Secondary | ICD-10-CM | POA: Diagnosis not present

## 2022-01-25 DIAGNOSIS — J018 Other acute sinusitis: Secondary | ICD-10-CM | POA: Diagnosis not present

## 2022-01-27 DIAGNOSIS — E1165 Type 2 diabetes mellitus with hyperglycemia: Secondary | ICD-10-CM | POA: Diagnosis not present

## 2022-01-27 DIAGNOSIS — E7800 Pure hypercholesterolemia, unspecified: Secondary | ICD-10-CM | POA: Diagnosis not present

## 2022-01-27 DIAGNOSIS — I10 Essential (primary) hypertension: Secondary | ICD-10-CM | POA: Diagnosis not present

## 2022-01-27 DIAGNOSIS — Z0000 Encounter for general adult medical examination without abnormal findings: Secondary | ICD-10-CM | POA: Diagnosis not present

## 2022-01-27 DIAGNOSIS — Z5320 Procedure and treatment not carried out because of patient's decision for unspecified reasons: Secondary | ICD-10-CM | POA: Diagnosis not present

## 2022-01-27 DIAGNOSIS — M19041 Primary osteoarthritis, right hand: Secondary | ICD-10-CM | POA: Diagnosis not present

## 2022-01-27 DIAGNOSIS — M8580 Other specified disorders of bone density and structure, unspecified site: Secondary | ICD-10-CM | POA: Diagnosis not present

## 2022-02-01 DIAGNOSIS — Z961 Presence of intraocular lens: Secondary | ICD-10-CM | POA: Diagnosis not present

## 2022-02-25 ENCOUNTER — Other Ambulatory Visit: Payer: Self-pay

## 2022-02-25 ENCOUNTER — Ambulatory Visit: Payer: Medicare Other | Admitting: Obstetrics and Gynecology

## 2022-02-25 ENCOUNTER — Encounter: Payer: Self-pay | Admitting: Obstetrics and Gynecology

## 2022-02-25 ENCOUNTER — Other Ambulatory Visit (HOSPITAL_COMMUNITY)
Admission: RE | Admit: 2022-02-25 | Discharge: 2022-02-25 | Disposition: A | Discharge status: Home / Self Care | Payer: Medicare Other | Source: Ambulatory Visit | Attending: Obstetrics and Gynecology | Admitting: Obstetrics and Gynecology

## 2022-02-25 VITALS — BP 120/70 | Ht 62.0 in | Wt 150.0 lb

## 2022-02-25 DIAGNOSIS — Z13820 Encounter for screening for osteoporosis: Secondary | ICD-10-CM

## 2022-02-25 DIAGNOSIS — Z01419 Encounter for gynecological examination (general) (routine) without abnormal findings: Secondary | ICD-10-CM | POA: Diagnosis not present

## 2022-02-25 DIAGNOSIS — N760 Acute vaginitis: Secondary | ICD-10-CM | POA: Diagnosis not present

## 2022-02-25 DIAGNOSIS — Z124 Encounter for screening for malignant neoplasm of cervix: Secondary | ICD-10-CM | POA: Insufficient documentation

## 2022-02-25 DIAGNOSIS — Z1211 Encounter for screening for malignant neoplasm of colon: Secondary | ICD-10-CM

## 2022-02-25 DIAGNOSIS — Z1151 Encounter for screening for human papillomavirus (HPV): Secondary | ICD-10-CM | POA: Insufficient documentation

## 2022-02-25 DIAGNOSIS — B3731 Acute candidiasis of vulva and vagina: Secondary | ICD-10-CM

## 2022-02-25 DIAGNOSIS — B379 Candidiasis, unspecified: Secondary | ICD-10-CM

## 2022-02-25 LAB — WET PREP FOR TRICH, YEAST, CLUE

## 2022-02-25 MED ORDER — TERCONAZOLE 0.8 % VA CREA: 1.0000 | TOPICAL_CREAM | Freq: Every day | VAGINAL | 0 refills | Status: DC

## 2022-02-25 NOTE — Progress Notes (Signed)
GYNECOLOGY  VISIT ?  ?HPI: ?76 y.o.   Divorced  Caucasian  female   ?[id number] with No LMP recorded (lmp unknown). Patient is postmenopausal.   ?here for breast and pelvic. ? ?She is followed for osteopenia with bone density at this office.  ?Osteopenia of bilateral hips.  ?T score of each -1.4.  ?Normal spine. ?FRAX indicates low fracture risk.  ? ?Thinks she has a yeast infection.  ?Having itching.  ?No discharge.  ? ?Has diabetes.  ? ?Notes her urine is yellow. ? ?Feels like she double voids.  ? ?She had blood in her stool, so she needs a colonoscopy. ?Has seen Dr. Watt Climes.  ?She had a colonoscopy scheduled, and she then cancelled it.  ?Hx hemorrhoids. ?She is asking my opinion about colonoscopy.  ? ?GYNECOLOGIC HISTORY: ?No LMP recorded (lmp unknown). Patient is postmenopausal. ?Contraception:  PMP ?Menopausal hormone therapy:  none ?Last mammogram:  09/09/21 - BI-RADS1, cat B ?Last pap smear:   02-14-20 Neg, 02-10-18 Neg, 02-09-17 Neg    ?       ?OB History   ? ? Gravida  ?2  ? Para  ?2  ? Term  ?2  ? Preterm  ?   ? AB  ?   ? Living  ?2  ?  ? ? SAB  ?   ? IAB  ?   ? Ectopic  ?   ? Multiple  ?   ? Live Births  ?   ?   ?  ?  ?    ? ?Patient Active Problem List  ? Diagnosis Date Noted  ? Osteopenia 01/14/2016  ? Intramural leiomyoma of uterus 09/30/2014  ? Type II or unspecified type diabetes mellitus without mention of complication, not stated as uncontrolled 09/19/2013  ? Hypertension   ? Elevated cholesterol   ? ? ?Past Medical History:  ?Diagnosis Date  ? Breast lump 08/2017  ? no cancer/ [facility]  ? Chronic cystitis   ? Diabetes mellitus   ? Elevated cholesterol   ? Heart murmur   ? Aortic valve stenosis.  Sees cardiologist with [facility]  ? Hypertension   ? Mild dysplasia of cervix (CIN I)   ? Osteopenia 02/2018  ? T score -1.8 FRAX 11% / 2.2%  ? ? ?Past Surgical History:  ?Procedure Laterality Date  ? BREAST BIOPSY Left   ? CATARACT EXTRACTION Bilateral   ? x2   ? CERVICAL BIOPSY  W/ LOOP ELECTRODE  EXCISION  2009  ? CHOLECYSTECTOMY    ? COLPOSCOPY    ? DILATION AND CURETTAGE OF UTERUS    ? TUBAL LIGATION    ? vocal cord surgery    ? nodules excised  ? ? ?Current Outpatient Medications  ?Medication Sig Dispense Refill  ? ALPRAZolam (XANAX) 0.5 MG tablet 1/2-1 TABLET TWICE A DAY, AS NEEDED FOR ANXIETY ORALLY  1  ? aspirin 81 MG chewable tablet Chew 81 mg by mouth every morning.    ? atorvastatin (LIPITOR) 40 MG tablet Take 40 mg by mouth every morning.    ? Cholecalciferol (VITAMIN D3) 2000 units TABS Take 1 capsule by mouth every morning.    ? empagliflozin (JARDIANCE) 25 MG TABS tablet Take 25 mg by mouth daily.    ? glucose blood (ONETOUCH VERIO) test strip for use when checking blood sugars    ? losartan-hydrochlorothiazide (HYZAAR) 100-12.5 MG per tablet Take 0.5 tablets by mouth every morning. 1/2 TAB PO QD    ? metFORMIN (  GLUCOPHAGE) 500 MG tablet Take 2 tablets by mouth 2 (two) times daily.  1  ? sitaGLIPtin (JANUVIA) 100 MG tablet Take 100 mg by mouth daily.    ? ?No current facility-administered medications for this visit.  ?  ? ?ALLERGIES: Bee venom, Dapagliflozin, and Penicillins ? ?Family History  ?Problem Relation Age of Onset  ? Diabetes Mother   ? Hypertension Mother   ? Heart disease Mother   ? Stroke Mother   ? Breast cancer Mother   ?     Age 59  ? Lung cancer Father   ? Breast cancer Maternal Aunt   ?     Age 5's  ? Diabetes Son   ? Heart attack Son   ? ? ?Social History  ? ?Socioeconomic History  ? Marital status: Divorced  ?  Spouse name: Not on file  ? Number of children: Not on file  ? Years of education: Not on file  ? Highest education level: Not on file  ?Occupational History  ? Not on file  ?Tobacco Use  ? Smoking status: Never  ? Smokeless tobacco: Never  ?Vaping Use  ? Vaping Use: Never used  ?Substance and Sexual Activity  ? Alcohol use: No  ?  Alcohol/week: 0.0 standard drinks  ? Drug use: No  ? Sexual activity: Never  ?  Birth control/protection: Post-menopausal, Surgical  ?   Comment: First sexual encounter at age 34. Less than 5 partners in her life.  ?Other Topics Concern  ? Not on file  ?Social History Narrative  ? Not on file  ? ?Social Determinants of Health  ? ?Financial Resource Strain: Not on file  ?Food Insecurity: Not on file  ?Transportation Needs: Not on file  ?Physical Activity: Not on file  ?Stress: Not on file  ?Social Connections: Not on file  ?Intimate Partner Violence: Not on file  ? ? ?Review of Systems  See HPI.  ? ?PHYSICAL EXAMINATION:   ? ?BP 120/70 (BP Location: Left Arm, Patient Position: Sitting, Cuff Size: Normal)   Ht '5\' 2"'$  (1.575 m)   Wt 150 lb (68 kg)   LMP  (LMP Unknown)   SpO2 98%   BMI 27.44 kg/m?     ?General appearance: alert, cooperative and appears stated age ?Head: Normocephalic, without obvious abnormality, atraumatic ?Neck: no adenopathy, supple, symmetrical, trachea midline and thyroid normal to inspection and palpation ?Lungs: clear to auscultation bilaterally ?Breasts: normal appearance, no masses or tenderness, No nipple retraction or dimpling, No nipple discharge or bleeding, No axillary or supraclavicular adenopathy ?Heart: regular rate and rhythm ?Abdomen: soft, non-tender, no masses,  no organomegaly ?Extremities: extremities normal, atraumatic, no cyanosis or edema ?Skin: Skin color, texture, turgor normal. No rashes or lesions ?Lymph nodes: Cervical, supraclavicular, and axillary nodes normal. ?No abnormal inguinal nodes palpated ?Neurologic: Grossly normal ? ?Pelvic: External genitalia:  no lesions ?             Urethra:  normal appearing urethra with no masses, tenderness or lesions ?             Bartholins and Skenes: normal    ?             Vagina: normal appearing vagina with normal color and discharge, no lesions ?             Cervix: no lesions ?               ?Bimanual Exam:  Uterus:  normal size, contour, position, consistency,  mobility, non-tender.  Involuntary guarding limits exam. ?             Adnexa: no mass, fullness,  tenderness ?             Rectal exam: yes.  Confirms. ?             Anus:  normal sphincter tone, no lesions ? ?Chaperone was present for exam:  Estill Bamberg, CMA ? ?ASSESSMENT ? ?Hx LEEP 2009.  ?CIN I on colpo biopsy and final LEEP pathology negative.   Follow up paps normal. ?Cervical cancer screening.  ?Osteopenia.  ?Vulvovaginitis. ?DM.  ?FH breast cancer.  ?Hx heme positive stool per report.  ? ?PLAN ? ?Pap and HR HPV.  ?Wet prep:  yeast noted.  Negative clue cells and negative trichomonas.  ?Terazol 3 pv at hs x 3 nights.   ?Mammogram recommended yearly.  ?SBE encouraged.  ?Will schedule BMD.  ?I discussed calcium, vit D, and regular exercise.  ?I recommend she proceed forward with the colonoscopy.  We discussed the benefits of this over doing barium enema or imaging studies for detection of colon cancer.  ?Fu in 2 years for breast, pelvic and pap.  ?  ?An After Visit Summary was printed and given to the patient. ? ?30 min total time was spent for this patient encounter, including preparation, face-to-face counseling with the patient, coordination of care, and documentation of the encounter. ? ? ?

## 2022-02-25 NOTE — Patient Instructions (Addendum)

## 2022-02-26 LAB — CYTOLOGY - PAP
Comment: NEGATIVE
Diagnosis: NEGATIVE
Diagnosis: REACTIVE
High risk HPV: NEGATIVE

## 2022-03-30 ENCOUNTER — Ambulatory Visit (HOSPITAL_COMMUNITY): Payer: Medicare Other | Attending: Cardiology

## 2022-03-30 DIAGNOSIS — I350 Nonrheumatic aortic (valve) stenosis: Secondary | ICD-10-CM | POA: Diagnosis not present

## 2022-03-30 LAB — ECHOCARDIOGRAM COMPLETE
AR max vel: 0.95 cm2
AV Area VTI: 1.04 cm2
AV Area mean vel: 0.92 cm2
AV Mean grad: 19.6 mmHg
AV Peak grad: 33.5 mmHg
Ao pk vel: 2.89 m/s
Area-P 1/2: 3.81 cm2
S' Lateral: 2.4 cm

## 2022-03-31 ENCOUNTER — Ambulatory Visit (INDEPENDENT_AMBULATORY_CARE_PROVIDER_SITE_OTHER): Payer: Medicare Other

## 2022-03-31 ENCOUNTER — Other Ambulatory Visit: Payer: Self-pay | Admitting: Obstetrics and Gynecology

## 2022-03-31 DIAGNOSIS — M8589 Other specified disorders of bone density and structure, multiple sites: Secondary | ICD-10-CM

## 2022-03-31 DIAGNOSIS — Z780 Asymptomatic menopausal state: Secondary | ICD-10-CM | POA: Diagnosis not present

## 2022-03-31 DIAGNOSIS — Z13820 Encounter for screening for osteoporosis: Secondary | ICD-10-CM

## 2022-04-04 NOTE — Progress Notes (Signed)
?Cardiology Office Note:   ? ?Date:  04/11/2022  ? ?ID:  Sheila Keller, DOB May 19, 1946, MRN [id number] ? ?PCP:  Maury Dus, MD  ?Cardiologist:  None  ?Electrophysiologist:  None  ? ?Referring MD: Maury Dus, MD  ? ?Chief Complaint  ?Patient presents with  ? Aortic Stenosis  ? ? ?History of Present Illness:   ? ?Sheila Keller is a 76 y.o. female with a hx of aortic stenosis, hypertension, diabetes, hyperlipidemia who presents for follow-up.  She was referred by Dr. Delilah Shan for evaluation of heart murmur.  She denied any chest pain, dyspnea, palpitations, LE edema, lightheadeness, or syncope.  For exercise she walks around yard for 20 minutes.  She denies any exertional symptoms.  Never smoked.  Mother had MI in 39s. ?  ?TTE on 03/05/2020 showed hyperdynamic LV systolic function, mild concentric LVH, grade 1 diastolic dysfunction, normal RV function, normal PASP, mild to moderate aortic stenosis.  Echocardiogram 03/17/2021 showed LVEF 70 to 94%, grade 1 diastolic dysfunction, normal RV function, moderate aortic stenosis.  Echocardiogram 03/30/2022 showed EF 70 to 76%, grade 1 diastolic dysfunction, LV mid cavitary gradient 22 mmHg (43 mmHg with Valsalva), normal RV function, moderate aortic stenosis (V-max 3 m/s, mean gradient 24 mmHg, AVA 0.8 cm?, DI 0.34). ?  ?Since last clinic vist, she reports she is doing well.  She had COVID-19 in September.  Denies any chest pain, dyspnea, lightheadedness, syncope, lower extremity edema, or palpitations. ? ? ?Past Medical History:  ?Diagnosis Date  ? Breast lump 08/2017  ? no cancer/ [facility]  ? Chronic cystitis   ? Diabetes mellitus   ? Elevated cholesterol   ? Heart murmur   ? Aortic valve stenosis.  Sees cardiologist with [facility]  ? Hypertension   ? Mild dysplasia of cervix (CIN I)   ? Osteopenia 02/2018  ? T score -1.8 FRAX 11% / 2.2%  ? ? ?Past Surgical History:  ?Procedure Laterality Date  ? BREAST BIOPSY Left   ? CATARACT EXTRACTION Bilateral   ? x2    ? CERVICAL BIOPSY  W/ LOOP ELECTRODE EXCISION  2009  ? CHOLECYSTECTOMY    ? COLPOSCOPY    ? DILATION AND CURETTAGE OF UTERUS    ? TUBAL LIGATION    ? vocal cord surgery    ? nodules excised  ? ? ?Current Medications: ?Current Meds  ?Medication Sig  ? aspirin 81 MG chewable tablet Chew 81 mg by mouth every morning.  ? atorvastatin (LIPITOR) 40 MG tablet Take 40 mg by mouth every morning.  ? Cholecalciferol (VITAMIN D3) 2000 units TABS Take 1 capsule by mouth every morning.  ? empagliflozin (JARDIANCE) 25 MG TABS tablet Take 25 mg by mouth daily.  ? glucose blood (ONETOUCH VERIO) test strip for use when checking blood sugars  ? losartan (COZAAR) 50 MG tablet Take 1 tablet (50 mg total) by mouth daily.  ? metFORMIN (GLUCOPHAGE) 500 MG tablet Take 2 tablets by mouth 2 (two) times daily.  ? sitaGLIPtin (JANUVIA) 100 MG tablet Take 100 mg by mouth daily.  ? terconazole (TERAZOL 3) 0.8 % vaginal cream Place 1 applicator vaginally at bedtime.  ? [DISCONTINUED] losartan-hydrochlorothiazide (HYZAAR) 100-12.5 MG per tablet Take 0.5 tablets by mouth every morning. 1/2 TAB PO QD  ?  ? ?Allergies:   Bee venom, Dapagliflozin, and Penicillins  ? ?Social History  ? ?Socioeconomic History  ? Marital status: Divorced  ?  Spouse name: Not on file  ? Number of children:  Not on file  ? Years of education: Not on file  ? Highest education level: Not on file  ?Occupational History  ? Not on file  ?Tobacco Use  ? Smoking status: Never  ? Smokeless tobacco: Never  ?Vaping Use  ? Vaping Use: Never used  ?Substance and Sexual Activity  ? Alcohol use: No  ?  Alcohol/week: 0.0 standard drinks  ? Drug use: No  ? Sexual activity: Never  ?  Birth control/protection: Post-menopausal, Surgical  ?  Comment: First sexual encounter at age 21. Less than 5 partners in her life.  ?Other Topics Concern  ? Not on file  ?Social History Narrative  ? Not on file  ? ?Social Determinants of Health  ? ?Financial Resource Strain: Not on file  ?Food Insecurity: Not  on file  ?Transportation Needs: Not on file  ?Physical Activity: Not on file  ?Stress: Not on file  ?Social Connections: Not on file  ?  ? ?Family History: ?The patient's family history includes Breast cancer in her maternal aunt and mother; Diabetes in her mother and son; Heart attack in her son; Heart disease in her mother; Hypertension in her mother; Lung cancer in her father; Stroke in her mother. ? ?ROS:   ?Please see the history of present illness.   ? All other systems reviewed and are negative. ? ?EKGs/Labs/Other Studies Reviewed:   ? ?The following studies were reviewed today: ? ? ?EKG:    ?04/06/22:  Normal sinus rhythm, rate 101, no ST abnormalities ?02/18/2020: normal sinus rhythm, rate 108, right axis deviation, no ST abnormalities, Q waves in III, aVF ?04/01/2021: Sinus rhythm. Rate 88 bpm. No ST abnormalities. ? ?Recent Labs: ?No results found for requested labs within last 8760 hours.  ?Recent Lipid Panel ?No results found for: CHOL, TRIG, HDL, CHOLHDL, VLDL, LDLCALC, LDLDIRECT ? ?Physical Exam:   ? ?VS:  BP 114/66 (BP Location: Left Arm, Patient Position: Sitting, Cuff Size: Normal)   Pulse (!) 101   Ht 5' 3.5" (1.613 m)   Wt 153 lb 9.6 oz (69.7 kg)   LMP  (LMP Unknown)   SpO2 96%   BMI 26.78 kg/m?    ? ?Wt Readings from Last 3 Encounters:  ?04/06/22 153 lb 9.6 oz (69.7 kg)  ?02/25/22 150 lb (68 kg)  ?04/01/21 151 lb 3.2 oz (68.6 kg)  ?  ? ?GEN:  in no acute distress ?HEENT: Normal ?NECK: No JVD ?CARDIAC: RRR, 3/6 systolic heart murmur loudest at the RUSB ?RESPIRATORY:  Clear to auscultation without rales, wheezing or rhonchi  ?ABDOMEN: Soft, non-tender, non-distended ?MUSCULOSKELETAL:  No edema; No deformity  ?SKIN: Warm and dry ?NEUROLOGIC:  Alert and oriented x 3 ?PSYCHIATRIC:  Normal affect  ? ?ASSESSMENT:   ? ?1. Aortic valve stenosis, etiology of cardiac valve disease unspecified   ?2. Essential hypertension   ?3. Hyperlipidemia, unspecified hyperlipidemia type   ? ? ?PLAN:   ? ? ?Aortic  stenosis: Echocardiogram 03/17/2021 showed moderate aortic stenosis (V-max 3 m/s, mean gradient 20 mmHg, AVA 1.0 cm?, DI 0.3).  Echocardiogram 03/30/2022 showed moderate aortic stenosis (V-max 3.0 m/s, mean gradient 24 mmHg, AVA 0.8 cm?, DI 0.34) ?-Repeat echocardiogram in 1 year to monitor ? ?Hypertension: On losartan-hydrochlorothiazide 50-6.25 mg.  Had significant mid cavitary gradient on echo, suspect she is a little dry.  We will hold HCTZ, switch to just losartan 50 mg daily.  Check BMET in 1 week.  Asked to check BP daily for next 2 weeks and call with results. ? ?  Hyperlipidemia: On atorvastatin 40 mg daily.  LDL 72 on 01/27/2022.  Check calcium score to guide how aggressive to be in lowering cholesterol. ? ?Type 2 diabetes: On Metformin, Januvia, Jardiance.  A1c 7.5 on 12/19/19 ? ? ?RTC in 6 months ? ?Medication Adjustments/Labs and Tests Ordered: ?Current medicines are reviewed at length with the patient today.  Concerns regarding medicines are outlined above.  ?Orders Placed This Encounter  ?Procedures  ? CT CARDIAC SCORING (SELF PAY ONLY)  ? Basic metabolic panel  ? EKG 12-Lead  ? ECHOCARDIOGRAM COMPLETE  ? ?Meds ordered this encounter  ?Medications  ? losartan (COZAAR) 50 MG tablet  ?  Sig: Take 1 tablet (50 mg total) by mouth daily.  ?  Dispense:  90 tablet  ?  Refill:  3  ?  STOP losartan/HCTZ  ? ? ?Patient Instructions  ?Medication Instructions:  ?STOP losartan/HCTZ ?START losartan 50 mg daily ? ?Please check your blood pressure at home daily, write it down.  Call the office or send message via Mychart with the readings in 2 weeks for Dr. Gardiner Rhyme to review.  ? ?*If you need a refill on your cardiac medications before your next appointment, please call your pharmacy* ? ? ?Lab Work: ?Please return for labs in 1 week (BMET) ? ?Our in office lab hours are Monday-Friday 8:00-4:00, closed for lunch 12:45-1:45 pm.  No appointment needed. ? ?[facility] locations: ?  ?[place] ?- [address] (Dr.  Kennon Holter office) ?- [place] ([facility]) ?- [address] ?- [place]  ?[place] ?- [address]  ?  ?[place]  ?- 3

## 2022-04-06 ENCOUNTER — Encounter: Payer: Self-pay | Admitting: Cardiology

## 2022-04-06 ENCOUNTER — Ambulatory Visit: Payer: Medicare Other | Admitting: Cardiology

## 2022-04-06 VITALS — BP 114/66 | HR 101 | Ht 63.5 in | Wt 153.6 lb

## 2022-04-06 DIAGNOSIS — I10 Essential (primary) hypertension: Secondary | ICD-10-CM | POA: Diagnosis not present

## 2022-04-06 DIAGNOSIS — E785 Hyperlipidemia, unspecified: Secondary | ICD-10-CM

## 2022-04-06 DIAGNOSIS — M79645 Pain in left finger(s): Secondary | ICD-10-CM | POA: Diagnosis not present

## 2022-04-06 DIAGNOSIS — I350 Nonrheumatic aortic (valve) stenosis: Secondary | ICD-10-CM | POA: Diagnosis not present

## 2022-04-06 MED ORDER — LOSARTAN POTASSIUM 50 MG PO TABS: 50.0000 mg | ORAL_TABLET | Freq: Every day | ORAL | 3 refills | Status: DC

## 2022-04-06 NOTE — Patient Instructions (Signed)
Medication Instructions:  ?STOP losartan/HCTZ ?START losartan 50 mg daily ? ?Please check your blood pressure at home daily, write it down.  Call the office or send message via Mychart with the readings in 2 weeks for Dr. Gardiner Rhyme to review.  ? ?*If you need a refill on your cardiac medications before your next appointment, please call your pharmacy* ? ? ?Lab Work: ?Please return for labs in 1 week (BMET) ? ?Our in office lab hours are Monday-Friday 8:00-4:00, closed for lunch 12:45-1:45 pm.  No appointment needed. ? ?[facility] locations: ?  ?[place] ?- [address] (Dr. Kennon Holter office) ?- [place] ([facility]) ?- [address] ?- [place]  ?[place] ?- [address]  ?  ?[place]  ?- [address]  ?  ?[place] ?- [place]  ?[place]  ?- [place] ?- [place] [address] ([facility]) ? ?Testing/Procedures: ?CT coronary calcium score.  ? ?Test locations:  ?[facility] ([address] [place], [place] [address]) ?[facility] ([address] [place], [place] [address])  ? ?This is $99 out of pocket. ? ? ?Coronary CalciumScan ?A coronary calcium scan is an imaging test used to look for deposits of calcium and other fatty materials (plaques) in the inner lining of the blood vessels of the heart (coronary arteries). These deposits of calcium and plaques can partly clog and narrow the coronary arteries without producing any symptoms or warning signs. This puts a person at risk for a heart attack. This test can detect these deposits before symptoms develop. ?Tell a health care provider about: ?Any allergies you have. ?All medicines you are taking, including vitamins, herbs, eye drops, creams, and over-the-counter medicines. ?Any problems you or family members have had with anesthetic medicines. ?Any blood disorders you have. ?Any  surgeries you have had. ?Any medical conditions you have. ?Whether you are pregnant or may be pregnant. ?What are the risks? ?Generally, this is a safe procedure. However, problems may occur, including: ?Harm to a pregnant woman and her unborn baby. This test involves the use of radiation. Radiation exposure can be dangerous to a pregnant woman and her unborn baby. If you are pregnant, you generally should not have this procedure done. ?Slight increase in the risk of cancer. This is because of the radiation involved in the test. ?What happens before the procedure? ?No preparation is needed for this procedure. ?What happens during the procedure? ?You will undress and remove any jewelry around your neck or chest. ?You will put on a hospital gown. ?Sticky electrodes will be placed on your chest. The electrodes will be connected to an electrocardiogram (ECG) machine to record a tracing of the electrical activity of your heart. ?A CT scanner will take pictures of your heart. During this time, you will be asked to lie still and hold your breath for 2-3 seconds while a picture of your heart is being taken. ?The procedure may vary among health care providers and hospitals. ?What happens after the procedure? ?You can get dressed. ?You can return to your normal activities. ?It is up to you to get the results of your test. Ask your health care provider, or the department that is doing the test, when your results will be ready. ?Summary ?A coronary calcium scan is an imaging test used to look for deposits of calcium and  other fatty materials (plaques) in the inner lining of the blood vessels of the heart (coronary arteries). ?Generally, this is a safe procedure. Tell your health care provider if you are pregnant or may be pregnant. ?No preparation is needed for this procedure. ?A CT scanner will take pictures of your heart. ?You can return to your normal activities after the scan is done. ?This information is not intended to  replace advice given to you by your health care provider. Make sure you discuss any questions you have with your health care provider. ?Document Released: 05/20/2008 Document Revised: 10/11/2016 Document Reviewed: 10/11/2016 ?Elsevier Interactive Patient Education ? 2017 [place]. ? ?Your physician has requested that you have an echocardiogram in 12 MONTHS. Echocardiography is a painless test that uses sound waves to create images of your heart. It provides your doctor with information about the size and shape of your heart and how well your heart?s chambers and valves are working. This procedure takes approximately one hour. There are no restrictions for this procedure. ? ?Follow-Up: ?At [facility], you and your health needs are our priority.  As part of our continuing mission to provide you with exceptional heart care, we have created designated Provider Care Teams.  These Care Teams include your primary Cardiologist (physician) and Advanced Practice Providers (APPs -  Physician Assistants and Nurse Practitioners) who all work together to provide you with the care you need, when you need it. ? ?We recommend signing up for the patient portal called "MyChart".  Sign up information is provided on this After Visit Summary.  MyChart is used to connect with patients for Virtual Visits (Telemedicine).  Patients are able to view lab/test results, encounter notes, upcoming appointments, etc.  Non-urgent messages can be sent to your provider as well.   ?To learn more about what you can do with MyChart, go to [email or web address].   ? ?Your next appointment:   ?6 month(s) ? ?The format for your next appointment:   ?In Person ? ?Provider:   ?Dr. Gardiner Rhyme ? ? ?Important Information About Sugar ? ? ? ? ? ? ?

## 2022-04-13 DIAGNOSIS — I10 Essential (primary) hypertension: Secondary | ICD-10-CM | POA: Diagnosis not present

## 2022-04-13 LAB — BASIC METABOLIC PANEL
BUN/Creatinine Ratio: 23 (ref 12–28)
BUN: 19 mg/dL (ref 8–27)
CO2: 21 mmol/L (ref 20–29)
Calcium: 9.5 mg/dL (ref 8.7–10.3)
Chloride: 105 mmol/L (ref 96–106)
Creatinine, Ser: 0.84 mg/dL (ref 0.57–1.00)
Glucose: 228 mg/dL — ABNORMAL HIGH (ref 70–99)
Potassium: 4.7 mmol/L (ref 3.5–5.2)
Sodium: 141 mmol/L (ref 134–144)
eGFR: 72 mL/min/{1.73_m2} (ref >59)

## 2022-04-13 NOTE — Progress Notes (Signed)
GYNECOLOGY  VISIT ?  ?HPI: ?76 y.o.   Divorced  Caucasian  female   ?[id number] with No LMP recorded (lmp unknown). Patient is postmenopausal.   ?here for consult of dexa results on 03/31/22 of osteopenia of left femoral neck @ -2.0.  ? ?Does not smoke or use alcohol.  ?No steroid use.  ?No upcoming dental surgery. ? ?Her BMD showed: ?Left femoral T score: -2.0 ?Right femoral T score:  -1.8 ?Spine T score: normal. ?FRAX:  3.6% risk of hip fracture and 14% major osteoporotic fracture  ? ?GYNECOLOGIC HISTORY: ?No LMP recorded (lmp unknown). Patient is postmenopausal. ?Contraception: PM ?Menopausal hormone therapy: none ?Last mammogram: 09/09/21-neg birads 1 ?Last pap smear: 02/25/22-WNL, HPV- neg ?       ?OB History   ? ? Gravida  ?2  ? Para  ?2  ? Term  ?2  ? Preterm  ?   ? AB  ?   ? Living  ?2  ?  ? ? SAB  ?   ? IAB  ?   ? Ectopic  ?   ? Multiple  ?   ? Live Births  ?   ?   ?  ?  ?    ? ?Patient Active Problem List  ? Diagnosis Date Noted  ? Osteopenia 01/14/2016  ? Intramural leiomyoma of uterus 09/30/2014  ? Type II or unspecified type diabetes mellitus without mention of complication, not stated as uncontrolled 09/19/2013  ? Hypertension   ? Elevated cholesterol   ? ? ?Past Medical History:  ?Diagnosis Date  ? Breast lump 08/2017  ? no cancer/ [facility]  ? Chronic cystitis   ? Diabetes mellitus   ? Elevated cholesterol   ? Heart murmur   ? Aortic valve stenosis.  Sees cardiologist with [facility]  ? Hypertension   ? Mild dysplasia of cervix (CIN I)   ? Osteopenia 02/2018  ? T score -1.8 FRAX 11% / 2.2%  ? ? ?Past Surgical History:  ?Procedure Laterality Date  ? BREAST BIOPSY Left   ? CATARACT EXTRACTION Bilateral   ? x2   ? CERVICAL BIOPSY  W/ LOOP ELECTRODE EXCISION  2009  ? CHOLECYSTECTOMY    ? COLPOSCOPY    ? DILATION AND CURETTAGE OF UTERUS    ? TUBAL LIGATION    ? vocal cord surgery    ? nodules excised  ? ? ?Current Outpatient Medications  ?Medication Sig Dispense Refill  ? ALPRAZolam (XANAX) 0.5 MG  tablet   1  ? aspirin 81 MG chewable tablet Chew 81 mg by mouth every morning.    ? atorvastatin (LIPITOR) 40 MG tablet Take 40 mg by mouth every morning.    ? Cholecalciferol (VITAMIN D3) 2000 units TABS Take 1 capsule by mouth every morning.    ? empagliflozin (JARDIANCE) 25 MG TABS tablet Take 25 mg by mouth daily.    ? glucose blood (ONETOUCH VERIO) test strip for use when checking blood sugars    ? losartan (COZAAR) 50 MG tablet Take 1 tablet (50 mg total) by mouth daily. 90 tablet 3  ? metFORMIN (GLUCOPHAGE) 500 MG tablet Take 2 tablets by mouth 2 (two) times daily.  1  ? sitaGLIPtin (JANUVIA) 100 MG tablet Take 100 mg by mouth daily.    ? ?No current facility-administered medications for this visit.  ?  ? ?ALLERGIES: Bee venom, Dapagliflozin, and Penicillins ? ?Family History  ?Problem Relation Age of Onset  ? Diabetes Mother   ?  Hypertension Mother   ? Heart disease Mother   ? Stroke Mother   ? Breast cancer Mother   ?     Age 71  ? Lung cancer Father   ? Breast cancer Maternal Aunt   ?     Age 66's  ? Diabetes Son   ? Heart attack Son   ? ? ?Social History  ? ?Socioeconomic History  ? Marital status: Divorced  ?  Spouse name: Not on file  ? Number of children: Not on file  ? Years of education: Not on file  ? Highest education level: Not on file  ?Occupational History  ? Not on file  ?Tobacco Use  ? Smoking status: Never  ? Smokeless tobacco: Never  ?Vaping Use  ? Vaping Use: Never used  ?Substance and Sexual Activity  ? Alcohol use: No  ?  Alcohol/week: 0.0 standard drinks  ? Drug use: No  ? Sexual activity: Never  ?  Birth control/protection: Post-menopausal, Surgical  ?  Comment: First sexual encounter at age 36. Less than 5 partners in her life.  ?Other Topics Concern  ? Not on file  ?Social History Narrative  ? Not on file  ? ?Social Determinants of Health  ? ?Financial Resource Strain: Not on file  ?Food Insecurity: Not on file  ?Transportation Needs: Not on file  ?Physical Activity: Not on file   ?Stress: Not on file  ?Social Connections: Not on file  ?Intimate Partner Violence: Not on file  ? ? ?Review of Systems  ?All other systems reviewed and are negative. ? ?PHYSICAL EXAMINATION:   ? ?BP 128/72   Pulse (!) 102   LMP  (LMP Unknown)   SpO2 97%     ?General appearance: alert, cooperative and appears stated age ? ?ASSESSMENT ? ?Osteopenia with FRAX risk of 3.6% hip fracture during next 10 years. ? ?PLAN ? ?We discussed osteopenia and osteoporosis.  ?BMD reviewed.  ?Check BMP and Vit D. ?After labs are back, plan for Actonel 150 mg monthly due to multiple other daily medications.  ?Instructed in use.  ?BMD in 2 years.  ?Plan for follow up in 3 months, assuming patient has started medication.  ?  ?An After Visit Summary was printed and given to the patient. ? ?29 min  total time was spent for this patient encounter, including preparation, face-to-face counseling with the patient, coordination of care, and documentation of the encounter. ? ?

## 2022-04-14 ENCOUNTER — Encounter: Payer: Self-pay | Admitting: *Deleted

## 2022-04-15 ENCOUNTER — Other Ambulatory Visit: Payer: Self-pay | Admitting: Gastroenterology

## 2022-04-15 ENCOUNTER — Telehealth: Payer: Self-pay | Admitting: Cardiology

## 2022-04-15 DIAGNOSIS — Z86010 Personal history of colonic polyps: Secondary | ICD-10-CM | POA: Diagnosis not present

## 2022-04-15 NOTE — Telephone Encounter (Signed)
? ?  Pre-operative Risk Assessment  ?  ?Patient Name: Sheila Keller  ?DOB: [birth year] ?MRN: [id number]  ? ?  ? ?Request for Surgical Clearance   ? ?Procedure:   colonoscopy  ? ?Date of Surgery:  Clearance 08/17/22                              ?   ?Surgeon: Dr. Watt Climes ?Surgeon's Group or Practice Name:  [facility] ?Phone number:  [phone] ?Fax number:  [phone] ?  ?Type of Clearance Requested:   ?- Medical  ?- Pharmacy - [facility] ?  ?Type of Anesthesia:   Propofol ?  ?Additional requests/questions:   n/a ? ?Signed, ?Sheral Apley M   ?04/15/2022, 1:41 PM  ? ?

## 2022-04-19 ENCOUNTER — Ambulatory Visit (INDEPENDENT_AMBULATORY_CARE_PROVIDER_SITE_OTHER): Payer: Medicare Other | Admitting: Obstetrics and Gynecology

## 2022-04-19 ENCOUNTER — Encounter: Payer: Self-pay | Admitting: Obstetrics and Gynecology

## 2022-04-19 VITALS — BP 128/72 | HR 102

## 2022-04-19 DIAGNOSIS — M8589 Other specified disorders of bone density and structure, multiple sites: Secondary | ICD-10-CM

## 2022-04-19 NOTE — Patient Instructions (Addendum)
Risedronate Oral Tablets ?What is this medication? ?RISEDRONATE (ris ED roe nate) slows calcium loss from bones. It treats Paget's disease and osteoporosis. It may be used in other people at risk for bone loss. ?This medicine may be used for other purposes; ask your health care provider or pharmacist if you have questions. ?COMMON BRAND NAME(S): Actonel ?What should I tell my care team before I take this medication? ?They need to know if you have any of these conditions: ?bleeding disorder ?dental disease ?difficulty swallowing ?infection (fever, chills, cough, sore throat, pain or trouble passing urine) ?kidney disease ?low levels of calcium in the blood ?low red blood cell counts ?receiving steroids like dexamethasone or prednisone ?stomach or intestine problems ?trouble sitting or standing for 30 minutes ?an unusual or allergic reaction to risedronate, other drugs, foods, dyes or preservatives ?pregnant or trying to get pregnant ?breast-feeding ?How should I use this medication? ?Take this drug by mouth with a full glass of water. Take it as directed on the prescription label. If you take it once a day, take it at the same time every day. If you take it once a month, take it on the same day of each month. Take the dose right after waking up. Do not eat or drink anything before taking it. Do not take it with any other drink except water. Do not chew or crush the tablet. After taking it, do not eat breakfast, drink, or take any other drugs or vitamins for at least 30 minutes. Sit or stand up for at least 30 minutes after you take it. Do not lie down. Keep taking it unless your health care provider tells you to stop. ?A special MedGuide will be given to you by the pharmacist with each prescription and refill. Be sure to read this information carefully each time. ?Talk to your health care provider about the use of this drug in children. Special care may be needed. ?Overdosage: If you think you have taken too much of  this medicine contact a poison control center or emergency room at once. ?NOTE: This medicine is only for you. Do not share this medicine with others. ?What if I miss a dose? ?If you take your drug once a day, skip it. Take your next dose at the scheduled time the next morning. Do not take two doses on the same day. ?If you take your drug once a month and the next scheduled dose is more than 7 days away, take the missed dose on the morning after you remember. Do not take two doses on the same day. ?If you take your drug once a month and the next scheduled dose is only 1 to 7 days away, skip it. Take the next dose on the morning of the next scheduled dose. Do not take two doses on the same day. ?What may interact with this medication? ?antacids like aluminum hydroxide or magnesium hydroxide ?aspirin ?calcium supplements ?iron supplements ?NSAIDs, medicines for pain and inflammation, like ibuprofen or naproxen ?thyroid hormones ?vitamins with minerals ?This list may not describe all possible interactions. Give your health care provider a list of all the medicines, herbs, non-prescription drugs, or dietary supplements you use. Also tell them if you smoke, drink alcohol, or use illegal drugs. Some items may interact with your medicine. ?What should I watch for while using this medication? ?Visit your health care provider for regular checks on your progress. It may be some time before you see the benefit from this drug. ?Some people who  take this drug have severe bone, joint, or muscle pain. This drug may also increase your risk for jaw problems or a broken thigh bone. Tell your health care provider right away if you have severe pain in your jaw, bones, joints, or muscles. Tell you health care provider if you have any pain that does not go away or that gets worse. ?You should make sure you get enough calcium and vitamin D while you are taking this drug. Discuss the foods you eat and the vitamins you take with your health  care provider. ?You may need blood work done while you are taking this drug. ?Tell your dentist and dental surgeon that you are taking this drug. You should not have major dental surgery while on this drug. See your dentist to have a dental exam and fix any dental problems before starting this drug. Take good care of your teeth while on this drug. Make sure you see your dentist for regular follow-up appointments. ?What side effects may I notice from receiving this medication? ?Side effects that you should report to your doctor or health care provider as soon as possible: ?allergic reactions (skin rash, itching or hives; swelling of the face, lips, or tongue) ?bone pain ?changes in vision ?eye irritation, itching ?general ill feeling or flu-like symptoms ?heartburn (burning feeling in chest, often after eating or when lying down) ?increase in blood pressure ?infection (fever, chills, cough, sore throat, pain or trouble passing urine) ?jaw pain, especially after dental work ?joint pain ?low calcium levels (fast heartbeat; muscle cramps or pain; pain, tingling, or numbness in the hands or feet; seizures) ?muscle pain ?painful or difficulty swallowing ?redness, blistering, peeling, or loosening of the skin, including inside the mouth ?Side effects that usually do not require medical attention (report to your doctor or health care provider if they continue or are bothersome): ?constipation ?depressed mood ?diarrhea ?dizziness ?headache ?nausea ?stomach pain ?swelling of the ankles, feet, hands ?trouble sleeping ?This list may not describe all possible side effects. Call your doctor for medical advice about side effects. You may report side effects to FDA at 1-800-FDA-1088. ?Where should I keep my medication? ?Keep out of the reach of children and pets. ?Store at room temperature between 20 and 25 degrees C (68 and 77 degrees F). Throw away any unused drug after the expiration date. ?NOTE: This sheet is a summary. It may  not cover all possible information. If you have questions about this medicine, talk to your doctor, pharmacist, or health care provider. ?? 2023 Elsevier/Gold Standard (2021-10-23 00:00:00) ? ? ?Calcium Content in Foods ?Calcium is the most abundant mineral in the body. Most of the body's calcium supply is stored in bones and teeth. Calcium helps many parts of the body function normally, including: ?Blood and blood vessels. ?Nerves. ?Hormones. ?Muscles. ?Bones and teeth. ?When your calcium stores are low, you may be at risk for low bone mass, bone loss, and broken bones (fractures). When you get enough calcium, it helps to support strong bones and teeth throughout your life. ?Calcium is especially important for: ?Children during growth spurts. ?Girls during adolescence. ?Women who are pregnant or breastfeeding. ?Women after their menstrual cycle stops (postmenopause). ?Women whose menstrual cycle has stopped due to anorexia nervosa or regular intense exercise. ?People who cannot eat or digest dairy products. ?Vegans. ?Recommended daily amounts of calcium: ?Women (ages 5 to 32): 1,000 mg per day. ?Women (ages 35 and older): 1,200 mg per day. ?Men (ages 53 to 19): 1,000 mg per  day. ?Men (ages 80 and older): 1,200 mg per day. ?Women (ages 29 to 72): 1,300 mg per day. ?Men (ages 36 to 24): 1,300 mg per day. ?General information ?Eat foods that are high in calcium. Try to get most of your calcium from food. ?Some people may benefit from taking calcium supplements. Check with your health care provider or diet and nutrition specialist (dietitian) before starting any calcium supplements. Calcium supplements may interact with certain medicines. Too much calcium may cause other health problems, such as constipation and kidney stones. ?For the body to absorb calcium, it needs vitamin D. Sources of vitamin D include: ?Skin exposure to direct sunlight. ?Foods, such as egg yolks, liver, mushrooms, saltwater fish, and fortified  milk. ?Vitamin D supplements. Check with your health care provider or dietitian before starting any vitamin D supplements. ?What foods are high in calcium? ? ?Foods that are high in calcium contain more

## 2022-04-20 LAB — BASIC METABOLIC PANEL
BUN: 16 mg/dL (ref 7–25)
CO2: 23 mmol/L (ref 20–32)
Calcium: 9.5 mg/dL (ref 8.6–10.4)
Chloride: 108 mmol/L (ref 98–110)
Creat: 0.82 mg/dL (ref 0.60–1.00)
Glucose, Bld: 167 mg/dL — ABNORMAL HIGH (ref 65–99)
Potassium: 4.8 mmol/L (ref 3.5–5.3)
Sodium: 143 mmol/L (ref 135–146)

## 2022-04-20 LAB — VITAMIN D 25 HYDROXY (VIT D DEFICIENCY, FRACTURES): Vit D, 25-Hydroxy: 34 ng/mL (ref 30–100)

## 2022-04-21 ENCOUNTER — Telehealth: Payer: Self-pay | Admitting: Cardiology

## 2022-04-21 ENCOUNTER — Other Ambulatory Visit: Payer: Self-pay

## 2022-04-21 MED ORDER — RISEDRONATE SODIUM 150 MG PO TABS: 150.0000 mg | ORAL_TABLET | ORAL | 0 refills | Status: DC

## 2022-04-21 NOTE — Telephone Encounter (Signed)
Returned call to pt, all questions answered.

## 2022-04-21 NOTE — Telephone Encounter (Signed)
Pt would like a callback regarding lab results. Pt states that she was told that she did not have to fast before having labs done. Pt said that she ate a biscuit with jelly before having the labs done and that is the cause of glucose being high. Pt also has a question regarding her Calcium as well. Please advise ?

## 2022-04-22 NOTE — Telephone Encounter (Signed)
   Patient Name: Sheila Keller  DOB: 1946/08/09 MRN: [id number]  Primary Cardiologist: Dr. Gardiner Rhyme  Chart reviewed as part of pre-operative protocol coverage. Pt recently saw Dr. Gardiner Rhyme in the office, coronary calcium scoring planned. We can await this study scheduled 6/20 before advising further on procedure/holding ASA (procedure not until 08/2022). Will send update to requesting provider so they are aware.   Charlie Pitter, PA-C 04/22/2022, 8:48 AM

## 2022-05-05 ENCOUNTER — Telehealth: Payer: Self-pay | Admitting: Cardiology

## 2022-05-05 NOTE — Telephone Encounter (Signed)
Spoke with patient of Dr. Gardiner Rhyme - she reports she dropped off BP readings to our front desk staff Cindee Salt.  She said it was 3 weeks of BP readings. Advised will send message to [facility] RN to follow up, as no note was in the chart.   She also was asking about her lab results. Explained that letter was mailed she said she has been having trouble getting her mail, as she has a new mail person. Also explained that [facility] LPN reviewed results with her on 5/17. She remembered this convo after the reminder.   Routed to primary nurse

## 2022-05-05 NOTE — Telephone Encounter (Signed)
   Pt said, she did not receive her lab result. Requesting to speak with nurse

## 2022-05-05 NOTE — Telephone Encounter (Signed)
BP log reviewed by Dr. Stefani Dama changes recommended at this time.  Spoke to patient, aware and verbalized understanding.

## 2022-05-18 DIAGNOSIS — H6123 Impacted cerumen, bilateral: Secondary | ICD-10-CM | POA: Diagnosis not present

## 2022-05-20 DIAGNOSIS — H04123 Dry eye syndrome of bilateral lacrimal glands: Secondary | ICD-10-CM | POA: Diagnosis not present

## 2022-05-20 DIAGNOSIS — H18413 Arcus senilis, bilateral: Secondary | ICD-10-CM | POA: Diagnosis not present

## 2022-05-20 DIAGNOSIS — H26491 Other secondary cataract, right eye: Secondary | ICD-10-CM | POA: Diagnosis not present

## 2022-05-20 DIAGNOSIS — Z961 Presence of intraocular lens: Secondary | ICD-10-CM | POA: Diagnosis not present

## 2022-05-25 ENCOUNTER — Ambulatory Visit
Admission: RE | Admit: 2022-05-25 | Discharge: 2022-05-25 | Disposition: A | Discharge status: Home / Self Care | Payer: Self-pay | Source: Ambulatory Visit | Attending: Cardiology | Admitting: Cardiology

## 2022-05-25 DIAGNOSIS — E785 Hyperlipidemia, unspecified: Secondary | ICD-10-CM

## 2022-05-27 NOTE — Telephone Encounter (Signed)
Patient had evidence of elevated calcium on CT. Dr. Gardiner Rhyme will follow up with her in clinic next week. I will defer to him for preop clearance.   Preop Callback - can you please add "preop clearance" to appt notes once appt is made on 6/29?

## 2022-05-28 ENCOUNTER — Telehealth: Payer: Self-pay | Admitting: Cardiology

## 2022-05-28 MED ORDER — ATORVASTATIN CALCIUM 40 MG PO TABS: 80.0000 mg | ORAL_TABLET | Freq: Every morning | ORAL | 3 refills | Status: DC

## 2022-05-28 NOTE — Telephone Encounter (Signed)
Pt is calling for results on CT cardiac scoring.

## 2022-06-02 NOTE — Progress Notes (Unsigned)
Cardiology Office Note:    Date:  06/03/2022   ID:  Sheila Keller, DOB [birth year], MRN [id number]  PCP:  Maury Dus, MD  Cardiologist:  None  Electrophysiologist:  None   Referring MD: Maury Dus, MD   Chief Complaint  Patient presents with   Coronary Artery Disease    History of Present Illness:    Sheila Keller is a 76 y.o. female with a hx of aortic stenosis, hypertension, diabetes, hyperlipidemia who presents for follow-up.  She was referred by Dr. Delilah Shan for evaluation of heart murmur.  She denied any chest pain, dyspnea, palpitations, LE edema, lightheadeness, or syncope.  For exercise she walks around yard for 20 minutes.  She denies any exertional symptoms.  Never smoked.  Mother had MI in 58s.   TTE on 03/05/2020 showed hyperdynamic LV systolic function, mild concentric LVH, grade 1 diastolic dysfunction, normal RV function, normal PASP, mild to moderate aortic stenosis.  Echocardiogram 03/17/2021 showed LVEF 70 to 78%, grade 1 diastolic dysfunction, normal RV function, moderate aortic stenosis.  Echocardiogram 03/30/2022 showed EF 70 to 29%, grade 1 diastolic dysfunction, LV mid cavitary gradient 22 mmHg (43 mmHg with Valsalva), normal RV function, moderate aortic stenosis (V-max 3 m/s, mean gradient 24 mmHg, AVA 0.8 cm, DI 0.34).   Since last clinic vist, she reports she is doing well.  Denies any chest pain, dyspnea, lightheadedness, syncope, lower extremity edema, or palpitations.  Can walk up flight of stairs without symptoms   Past Medical History:  Diagnosis Date   Breast lump 08/2017   no cancer/ [facility]   Chronic cystitis    Diabetes mellitus    Elevated cholesterol    Heart murmur    Aortic valve stenosis.  Sees cardiologist with [facility]   Hypertension    Mild dysplasia of cervix (CIN I)    Osteopenia 02/2018   T score -1.8 FRAX 11% / 2.2%    Past Surgical History:  Procedure Laterality Date   BREAST BIOPSY Left    CATARACT  EXTRACTION Bilateral    x2    CERVICAL BIOPSY  W/ LOOP ELECTRODE EXCISION  2009   CHOLECYSTECTOMY     COLPOSCOPY     DILATION AND CURETTAGE OF UTERUS     TUBAL LIGATION     vocal cord surgery     nodules excised    Current Medications: Current Meds  Medication Sig   ALPRAZolam (XANAX) 0.5 MG tablet    aspirin 81 MG chewable tablet Chew 81 mg by mouth every morning.   Cholecalciferol (VITAMIN D3) 2000 units TABS Take 1 capsule by mouth every morning.   empagliflozin (JARDIANCE) 25 MG TABS tablet Take 25 mg by mouth daily.   glucose blood (ONETOUCH VERIO) test strip for use when checking blood sugars   metFORMIN (GLUCOPHAGE) 500 MG tablet Take 2 tablets by mouth 2 (two) times daily.   risedronate (ACTONEL) 150 MG tablet Take 1 tablet (150 mg total) by mouth every 30 (thirty) days. with water on empty stomach, nothing by mouth or lie down for next 30 minutes.   sitaGLIPtin (JANUVIA) 100 MG tablet Take 100 mg by mouth daily.   [DISCONTINUED] atorvastatin (LIPITOR) 80 MG tablet 80 mg daily.   [DISCONTINUED] losartan (COZAAR) 50 MG tablet Take 1 tablet (50 mg total) by mouth daily.     Allergies:   Bee venom, Dapagliflozin, and Penicillins   Social History   Socioeconomic History   Marital status: Divorced    Spouse  name: Not on file   Number of children: Not on file   Years of education: Not on file   Highest education level: Not on file  Occupational History   Not on file  Tobacco Use   Smoking status: Never   Smokeless tobacco: Never  Vaping Use   Vaping Use: Never used  Substance and Sexual Activity   Alcohol use: No    Alcohol/week: 0.0 standard drinks of alcohol   Drug use: No   Sexual activity: Never    Birth control/protection: Post-menopausal, Surgical    Comment: First sexual encounter at age 10. Less than 5 partners in her life.  Other Topics Concern   Not on file  Social History Narrative   Not on file   Social Determinants of Health   Financial Resource  Strain: Not on file  Food Insecurity: Not on file  Transportation Needs: Not on file  Physical Activity: Not on file  Stress: Not on file  Social Connections: Not on file     Family History: The patient's family history includes Breast cancer in her maternal aunt and mother; Diabetes in her mother and son; Heart attack in her son; Heart disease in her mother; Hypertension in her mother; Lung cancer in her father; Stroke in her mother.  ROS:   Please see the history of present illness.    All other systems reviewed and are negative.  EKGs/Labs/Other Studies Reviewed:    The following studies were reviewed today:   EKG:    04/06/22:  Normal sinus rhythm, rate 101, no ST abnormalities 02/18/2020: normal sinus rhythm, rate 108, right axis deviation, no ST abnormalities, Q waves in III, aVF 04/01/2021: Sinus rhythm. Rate 88 bpm. No ST abnormalities.  Recent Labs: 04/19/2022: BUN 16; Creat 0.82; Potassium 4.8; Sodium 143  Recent Lipid Panel No results found for: "CHOL", "TRIG", "HDL", "CHOLHDL", "VLDL", "LDLCALC", "LDLDIRECT"  Physical Exam:    VS:  BP (!) 147/85   Pulse 94   Ht 5' 3.5" (1.613 m)   Wt 156 lb 6.4 oz (70.9 kg)   LMP  (LMP Unknown)   SpO2 98%   BMI 27.27 kg/m     Wt Readings from Last 3 Encounters:  06/03/22 156 lb 6.4 oz (70.9 kg)  04/06/22 153 lb 9.6 oz (69.7 kg)  02/25/22 150 lb (68 kg)     GEN:  in no acute distress HEENT: Normal NECK: No JVD CARDIAC: RRR, 3/6 systolic heart murmur loudest at the RUSB RESPIRATORY:  Clear to auscultation without rales, wheezing or rhonchi  ABDOMEN: Soft, non-tender, non-distended MUSCULOSKELETAL:  No edema; No deformity  SKIN: Warm and dry NEUROLOGIC:  Alert and oriented x 3 PSYCHIATRIC:  Normal affect   ASSESSMENT:    1. Elevated coronary artery calcium score   2. Coronary artery disease involving native coronary artery of native heart without angina pectoris   3. Pre-op evaluation   4. Aortic valve stenosis,  etiology of cardiac valve disease unspecified   5. Essential hypertension   6. Hyperlipidemia, unspecified hyperlipidemia type      PLAN:    CAD: Calcium score 1176 (95th percentile) on 05/25/2022.  Denies any chest pain or dyspnea. -Recommend stress PET to evaluate for ischemia -Continue aspirin 81 mg daily -Atorvastatin increased to 80 mg daily  Preop evaluation: Prior to colonoscopy, scheduled for 9/12.  Given markedly elevated calcium score, planning stress PET as above.  Aortic stenosis: Echocardiogram 03/17/2021 showed moderate aortic stenosis (V-max 3 m/s, mean gradient 20 mmHg,  AVA 1.0 cm, DI 0.3).  Echocardiogram 03/30/2022 showed moderate aortic stenosis (V-max 3.0 m/s, mean gradient 24 mmHg, AVA 0.8 cm, DI 0.34) -Repeat echocardiogram in 1 year to monitor  Hypertension: On losartan-hydrochlorothiazide 50-6.25 mg.  Had significant mid cavitary gradient on echo, suspect she is a little dry.  We will hold HCTZ, switch to just losartan 50 mg daily.  Check BMET in 1 week.  Asked to check BP daily for next 2 weeks and call with results.  Hyperlipidemia: On atorvastatin 40 mg daily,  LDL 72 on 01/27/2022.  Calcium score 1176 on 05/25/2022.  Atorvastatin increased to 80 mg daily  Type 2 diabetes: On Metformin, Januvia, Jardiance.  A1c 7.5 on 12/19/19   RTC in 6 months   Shared Decision Making/Informed Consent The risks [chest pain, shortness of breath, cardiac arrhythmias, dizziness, blood pressure fluctuations, myocardial infarction, stroke/transient ischemic attack, nausea, vomiting, allergic reaction, radiation exposure, metallic taste sensation and life-threatening complications (estimated to be 1 in 10,000)], benefits (risk stratification, diagnosing coronary artery disease, treatment guidance) and alternatives of a cardiac PET stress test were discussed in detail with Sheila Keller and she agrees to proceed.   Medication Adjustments/Labs and Tests Ordered: Current medicines are  reviewed at length with the patient today.  Concerns regarding medicines are outlined above.  Orders Placed This Encounter  Procedures   NM PET CT CARDIAC PERFUSION MULTI W/ABSOLUTE BLOODFLOW   Basic metabolic panel   Cardiac Stress Test: Informed Consent Details: Physician/Practitioner Attestation; Transcribe to consent form and obtain patient signature   Meds ordered this encounter  Medications   atorvastatin (LIPITOR) 80 MG tablet    Sig: Take 1 tablet (80 mg total) by mouth daily.    Dispense:  90 tablet    Refill:  3   losartan (COZAAR) 100 MG tablet    Sig: Take 1 tablet (100 mg total) by mouth daily.    Dispense:  90 tablet    Refill:  3    Dose increase    Patient Instructions  Medication Instructions:  INCREASE Losartan to 100 mg daily  *If you need a refill on your cardiac medications before your next appointment, please call your pharmacy*   Lab Work: Please return for labs in 1 week (BMET)  Our in office lab hours are Monday-Friday 8:00-4:00, closed for lunch 12:45-1:45 pm.  No appointment needed.  [facility] locations:   [place] - [address] [place] [address] - [address] [place] - [address] Oncologist)  Testing/Procedures: CARDIAC PET- Your physician has requested that you have a Cardiac Pet Stress Test. [facility] [place] [address]). The schedulers will call you to get this scheduled. Please follow instructions below and call the office with any questions/concerns [phone]).  Follow-Up: At [facility], you and your health needs are our priority.  As part of our  continuing mission to provide you with exceptional heart care, we have created designated Provider Care Teams.  These Care Teams include your primary Cardiologist (physician) and Advanced Practice Providers (APPs -  Physician Assistants and Nurse  Practitioners) who all work together to provide you with the care you need, when you need it.  We recommend signing up for the patient portal called "MyChart".  Sign up information is provided on this After Visit Summary.  MyChart is used to connect with patients for Virtual Visits (Telemedicine).  Patients are able to view lab/test results, encounter notes, upcoming appointments, etc.  Non-urgent messages can be sent to your provider as well.   To learn more about what you can do with MyChart, go to [email or web address].    Your next appointment:   As scheduled with Dr. Gardiner Rhyme  Other Instructions How to Prepare for Your Cardiac PET/CT Stress Test:  1. Please do not take these medications before your test:   Medications that may interfere with the cardiac pharmacological stress agent (ex. nitrates - including erectile dysfunction medications or beta-blockers) the day of the exam. (Erectile dysfunction medication should be held for at least 72 hrs prior to test) Theophylline containing medications for 12 hours. Dipyridamole 48 hours prior to the test. Your remaining medications may be taken with water.  2. Nothing to eat or drink, except water, 3 hours prior to arrival time.   NO caffeine/decaffeinated products, or chocolate 12 hours prior to arrival.  3. NO perfume, cologne or lotion  4. Total time is 1 to 2 hours; you may want to bring reading material for the waiting time.  5. Please report to Admitting at the [facility] Main Entrance 60 minutes early for your test.  [place], [place] [address]  Diabetic Preparation:  Hold oral medications. You may take NPH and Lantus insulin. Do not take Humalog or Humulin R  (Regular Insulin) the day of your test. Check blood sugars prior to leaving the house. If able to eat breakfast prior to 3 hour fasting, you may take all medications, including your insulin, Do not worry if you miss your breakfast dose of insulin - start at your next meal.  IF YOU THINK YOU MAY BE PREGNANT, OR ARE NURSING PLEASE INFORM THE TECHNOLOGIST.  In preparation for your appointment, medication and supplies will be purchased.  Appointment availability is limited, so if you need to cancel or reschedule, please call the Radiology Department at [phone]  24 hours in advance to avoid a cancellation fee of $100.00  What to Expect After you Arrive:  Once you arrive and check in for your appointment, you will be taken to a preparation room within the Radiology Department.  A technologist or Nurse will obtain your medical history, verify that you are correctly prepped for the exam, and explain the procedure.  Afterwards,  an IV will be started in your arm and electrodes will be placed on your skin for EKG monitoring during the stress portion of the exam. Then you will be escorted to the PET/CT scanner.  There, staff will get you positioned on the scanner and obtain a blood pressure and EKG.  During the exam, you will continue to be connected to the EKG and blood pressure machines.  A small, safe amount of a radioactive tracer will be injected in your IV to obtain a series of pictures of your heart along with an injection of a stress agent.    After your Exam:  It is recommended that you eat a meal and drink a caffeinated beverage to counter act any effects of the stress agent.  Drink plenty of fluids for the remainder of the day and urinate frequently for  the first couple of hours after the exam.  Your doctor will inform you of your test results within 7-10 business days.  For questions about your test or how to prepare for your test, please call: Marchia Bond, Cardiac Imaging Nurse Navigator   Gordy Clement, Cardiac Imaging Nurse Navigator Office: [phone]               Signed, Donato Heinz, MD  06/03/2022 8:56 AM    [place]

## 2022-06-03 ENCOUNTER — Ambulatory Visit (HOSPITAL_BASED_OUTPATIENT_CLINIC_OR_DEPARTMENT_OTHER): Payer: Medicare Other | Admitting: Cardiology

## 2022-06-03 ENCOUNTER — Encounter (HOSPITAL_BASED_OUTPATIENT_CLINIC_OR_DEPARTMENT_OTHER): Payer: Self-pay | Admitting: Cardiology

## 2022-06-03 VITALS — BP 147/85 | HR 94 | Ht 63.5 in | Wt 156.4 lb

## 2022-06-03 DIAGNOSIS — R931 Abnormal findings on diagnostic imaging of heart and coronary circulation: Secondary | ICD-10-CM | POA: Diagnosis not present

## 2022-06-03 DIAGNOSIS — I2510 Atherosclerotic heart disease of native coronary artery without angina pectoris: Secondary | ICD-10-CM

## 2022-06-03 DIAGNOSIS — E785 Hyperlipidemia, unspecified: Secondary | ICD-10-CM

## 2022-06-03 DIAGNOSIS — I10 Essential (primary) hypertension: Secondary | ICD-10-CM

## 2022-06-03 DIAGNOSIS — I350 Nonrheumatic aortic (valve) stenosis: Secondary | ICD-10-CM

## 2022-06-03 DIAGNOSIS — Z01818 Encounter for other preprocedural examination: Secondary | ICD-10-CM | POA: Diagnosis not present

## 2022-06-03 MED ORDER — LOSARTAN POTASSIUM 100 MG PO TABS: 100.0000 mg | ORAL_TABLET | Freq: Every day | ORAL | 3 refills | Status: DC

## 2022-06-03 MED ORDER — ATORVASTATIN CALCIUM 80 MG PO TABS: 80.0000 mg | ORAL_TABLET | Freq: Every day | ORAL | 3 refills | Status: DC

## 2022-06-03 NOTE — Patient Instructions (Signed)
Medication Instructions:  INCREASE Losartan to 100 mg daily  *If you need a refill on your cardiac medications before your next appointment, please call your pharmacy*   Lab Work: Please return for labs in 1 week (BMET)  Our in office lab hours are Monday-Friday 8:00-4:00, closed for lunch 12:45-1:45 pm.  No appointment needed.  [facility] locations:   [place]) - [address] [place] [address] - [address] [place] - [address] Oncologist)  Testing/Procedures: CARDIAC PET- Your physician has requested that you have a Cardiac Pet Stress Test. [facility] [place] [address]). The schedulers will call you to get this scheduled. Please follow instructions below and call the office with any questions/concerns [phone]).  Follow-Up: At [facility], you and your health needs are our priority.  As part of our continuing mission to provide you with exceptional heart care, we have created designated Provider Care Teams.  These Care Teams include your primary Cardiologist (physician) and Advanced Practice Providers (APPs -  Physician Assistants and Nurse Practitioners) who all work together to provide you with the care you need, when you need it.  We recommend signing up for the patient portal called "MyChart".  Sign up information is provided on this After Visit Summary.  MyChart is used to connect with patients for Virtual Visits (Telemedicine).  Patients are able to view lab/test results, encounter notes, upcoming appointments, etc.  Non-urgent messages can be sent to your provider as well.   To learn more about  what you can do with MyChart, go to [email or web address].    Your next appointment:   As scheduled with Dr. Gardiner Rhyme  Other Instructions How to Prepare for Your Cardiac PET/CT Stress Test:  1. Please do not take these medications before your test:   Medications that may interfere with the cardiac pharmacological stress agent (ex. nitrates - including erectile dysfunction medications or beta-blockers) the day of the exam. (Erectile dysfunction medication should be held for at least 72 hrs prior to test) Theophylline containing medications for 12 hours. Dipyridamole 48 hours prior to the test. Your remaining medications may be taken with water.  2. Nothing to eat or drink, except water, 3 hours prior to arrival time.   NO caffeine/decaffeinated products, or chocolate 12 hours prior to arrival.  3. NO perfume, cologne or lotion  4. Total time is 1 to 2 hours; you may want to bring reading material for the waiting time.  5. Please report to Admitting at the [facility] Main Entrance 60 minutes early for your test.  [place], [place] [address]  Diabetic Preparation:  Hold oral medications. You may take NPH and Lantus insulin. Do not take Humalog or Humulin R (Regular Insulin) the day of your test. Check blood sugars prior to leaving the house. If able to eat breakfast prior to 3 hour fasting, you may take all medications, including your insulin, Do not worry if you miss your breakfast dose of insulin - start at your next meal.  IF YOU THINK YOU MAY BE PREGNANT,  OR ARE NURSING PLEASE INFORM THE TECHNOLOGIST.  In preparation for your appointment, medication and supplies will be purchased.  Appointment availability is limited, so if you need to cancel or reschedule, please call the Radiology Department at [phone]  24 hours in advance to avoid a cancellation fee of $100.00  What to Expect After you Arrive:  Once you arrive and check in for your  appointment, you will be taken to a preparation room within the Radiology Department.  A technologist or Nurse will obtain your medical history, verify that you are correctly prepped for the exam, and explain the procedure.  Afterwards,  an IV will be started in your arm and electrodes will be placed on your skin for EKG monitoring during the stress portion of the exam. Then you will be escorted to the PET/CT scanner.  There, staff will get you positioned on the scanner and obtain a blood pressure and EKG.  During the exam, you will continue to be connected to the EKG and blood pressure machines.  A small, safe amount of a radioactive tracer will be injected in your IV to obtain a series of pictures of your heart along with an injection of a stress agent.    After your Exam:  It is recommended that you eat a meal and drink a caffeinated beverage to counter act any effects of the stress agent.  Drink plenty of fluids for the remainder of the day and urinate frequently for the first couple of hours after the exam.  Your doctor will inform you of your test results within 7-10 business days.  For questions about your test or how to prepare for your test, please call: Marchia Bond, Cardiac Imaging Nurse Navigator  Gordy Clement, Cardiac Imaging Nurse Navigator Office: [phone]

## 2022-06-04 ENCOUNTER — Other Ambulatory Visit: Payer: Self-pay | Admitting: Obstetrics and Gynecology

## 2022-06-04 NOTE — Telephone Encounter (Signed)
Follow up scheduled on 07/21/22

## 2022-06-10 DIAGNOSIS — I350 Nonrheumatic aortic (valve) stenosis: Secondary | ICD-10-CM | POA: Diagnosis not present

## 2022-06-10 DIAGNOSIS — I2510 Atherosclerotic heart disease of native coronary artery without angina pectoris: Secondary | ICD-10-CM | POA: Diagnosis not present

## 2022-06-10 DIAGNOSIS — I10 Essential (primary) hypertension: Secondary | ICD-10-CM | POA: Diagnosis not present

## 2022-06-11 LAB — BASIC METABOLIC PANEL
BUN/Creatinine Ratio: 21 (ref 12–28)
BUN: 17 mg/dL (ref 8–27)
CO2: 19 mmol/L — ABNORMAL LOW (ref 20–29)
Calcium: 9.6 mg/dL (ref 8.7–10.3)
Chloride: 104 mmol/L (ref 96–106)
Creatinine, Ser: 0.8 mg/dL (ref 0.57–1.00)
Glucose: 177 mg/dL — ABNORMAL HIGH (ref 70–99)
Potassium: 4.5 mmol/L (ref 3.5–5.2)
Sodium: 141 mmol/L (ref 134–144)
eGFR: 76 mL/min/{1.73_m2} (ref >59)

## 2022-06-14 ENCOUNTER — Telehealth: Payer: Self-pay

## 2022-06-14 ENCOUNTER — Telehealth: Payer: Self-pay | Admitting: Cardiology

## 2022-06-14 NOTE — Telephone Encounter (Signed)
Patient wanted to know if she needs to continue to monitor BP since losartan dose increase. Since July 1st her bp has ranged from 118/74 to 146/92 with pulse 88- 127. She checks BP after breakfast. She drinks caffeinated coffee. Recommended that she check BP 2 hours after taking losartan. She wanted to know if she still needed the PET CT cardiac scan. I told her yes, to schedule it when she receives the call.

## 2022-06-14 NOTE — Telephone Encounter (Signed)
Patient calling to find out if she needs to take BP readings and if that means she does not need to take that "other test in august". She also says last time her sugar was tested and she would like to know if that was tested and if so what it was. She would like answer in her voicemail if she does not answer, because she will be leaving in about an hour.

## 2022-06-16 NOTE — Telephone Encounter (Signed)
Spoke to patient, patient states she gave all of her readings to the nurse who she spoke to previously.   The higher readings (140s/90s) were when she wakes up in the morning prior to medication.   Advised when checking in the AM to wait 1-2 hours after taking AM medications.  Advised to continue to monitor and let us know if consistently >130/80.   Patient also aware to proceed with scheduling PET as recommended but aware this is being scheduled a few months out.

## 2022-06-16 NOTE — Telephone Encounter (Signed)
Can she send in her BP log since increasing losartan?

## 2022-06-25 ENCOUNTER — Telehealth: Payer: Self-pay | Admitting: Cardiology

## 2022-06-25 NOTE — Telephone Encounter (Signed)
Pt is wanting a call back to discuss CT test that Dr. Gardiner Rhyme wants her to have. She states that this test is not something she is interested in, but is concerned that the doctor will no longer want to be her doctor if she chooses not to have it done. She states that the CT dye is her main concern. She would like to discuss other options.

## 2022-06-25 NOTE — Telephone Encounter (Signed)
The patient called and stated that she refuses to do the PET scan. She said she read about it online and now she is scared to do it. The test has been explained to her but she stated that she will not do it. She stated that she is not  "sick sick" and will not do any test until she feels like she gets "sick sick."   She has been having having anxiety over the test and feels like she cannot do it.

## 2022-06-27 NOTE — Telephone Encounter (Signed)
We could switch to [facility] if she would prefer

## 2022-06-30 NOTE — Telephone Encounter (Signed)
Spoke to patient, she reports having significant anxiety about PET scan and she does not want anything "injected" into her.   She doesn't think she will be able to complete these test without having a panic attack.  She reports she does not have any symptoms and would prefer to hold off on further testing.  She is aware this could affect her upcoming procedure if cardiac clearance is needed.   She is seeing her PCP soon and will discuss further with him.  Advised if she changes her mind and would like to have PET or lexiscan to call back.     Routed to MD to make aware

## 2022-07-01 ENCOUNTER — Telehealth: Payer: Self-pay

## 2022-07-01 NOTE — Telephone Encounter (Signed)
Dr. Perley Jain office called to let Dr. Quincy Simmonds know that they had patient scheduled for colonoscopy but patient has decided she doesn't want to have it.

## 2022-07-05 NOTE — Telephone Encounter (Signed)
Spoke with patient, she is now agreeable to proceeding with PET- was her appointment cancelled? Can we reschedule her if so?

## 2022-07-12 NOTE — Telephone Encounter (Signed)
Scheduling team aware to contact patient to reschedule PET

## 2022-07-14 ENCOUNTER — Telehealth: Payer: Self-pay | Admitting: Cardiology

## 2022-07-14 NOTE — Telephone Encounter (Signed)
Patient called wanting to confirm all of the medication she has as she is concerned a medication may interact with her flu shot.

## 2022-07-14 NOTE — Telephone Encounter (Signed)
Patient called --she wanted to know drom Dr Gardiner Rhyme if  she can take the  second dose ofthe shingles  injection  on the 07/20/22  and  Actonel at 07/23/22( which is one time monthly medications. She wanted to if either of these drugs will interfere with her having the PET Scan on 07/21/22.   RN informed patient will defer to Dr Gardiner Rhyme and [facility] pharmacist and will contact her back

## 2022-07-14 NOTE — Telephone Encounter (Signed)
The vaccine is fine with the Actonel being a few days later.  Will defer to Dr. Gardiner Rhyme regarding the PET scan

## 2022-07-16 NOTE — Telephone Encounter (Signed)
That is fine 

## 2022-07-16 NOTE — Telephone Encounter (Signed)
Spoke to patient  and informed patient Dr Gardiner Rhyme and pharmacist comment. She voiced understanding.

## 2022-07-19 NOTE — Progress Notes (Unsigned)
GYNECOLOGY  VISIT   HPI: 76 y.o.   Divorced  Caucasian  female   G2P2002 with No LMP recorded (lmp unknown). Patient is postmenopausal.   here for medication follow up.    Patient on Actonel monthly for osteopenia with increased risk of hip fracture at 3.6%. Not having any problems.  No acid reflux.  No joint pain or aching.   Taking vit D and eats calcium rich foods.   She may have a dental procedure.  She will have an extraction of a tooth and then a bridge placed.  No dental implant.   Sill be having a stress test in September.  Patient has elevated calcium score noted.  Has motion lights at home.   GYNECOLOGIC HISTORY: No LMP recorded (lmp unknown). Patient is postmenopausal. Contraception:  PMP Menopausal hormone therapy:  none Last mammogram:  09/09/21-neg birads 1 Last pap smear: 02-25-22 Neg:Neg HR HPV        OB History     Gravida  2   Para  2   Term  2   Preterm      AB      Living  2      SAB      IAB      Ectopic      Multiple      Live Births                 Patient Active Problem List   Diagnosis Date Noted   Osteopenia 01/14/2016   Intramural leiomyoma of uterus 10/Sheila/2015   Type II or unspecified type diabetes mellitus without mention of complication, not stated as uncontrolled 09/19/2013   Hypertension    Elevated cholesterol     Past Medical History:  Diagnosis Date   Breast lump 08/2017   no cancer/ [facility]   Chronic cystitis    Diabetes mellitus    Elevated cholesterol    Heart murmur    Aortic valve stenosis.  Sees cardiologist with [facility]   Hypertension    Mild dysplasia of cervix (CIN I)    Osteopenia 02/2018   T score -1.8 FRAX 11% / 2.2%    Past Surgical History:  Procedure Laterality Date   BREAST BIOPSY Left    CATARACT EXTRACTION Bilateral    x2    CERVICAL BIOPSY  W/ LOOP ELECTRODE EXCISION  2009   CHOLECYSTECTOMY     COLPOSCOPY     DILATION AND CURETTAGE OF UTERUS     TUBAL  LIGATION     vocal cord surgery     nodules excised    Current Outpatient Medications  Medication Sig Dispense Refill   ALPRAZolam (XANAX) 0.5 MG tablet   1   aspirin 81 MG chewable tablet Chew 81 mg by mouth every morning.     atorvastatin (LIPITOR) 80 MG tablet Take 1 tablet (80 mg total) by mouth daily. 90 tablet 3   Cholecalciferol (VITAMIN D3) 2000 units TABS Take 1 capsule by mouth every morning.     empagliflozin (JARDIANCE) 25 MG TABS tablet Take 25 mg by mouth daily.     glucose blood (ONETOUCH VERIO) test strip for use when checking blood sugars     losartan (COZAAR) 100 MG tablet Take 1 tablet (100 mg total) by mouth daily. 90 tablet 3   metFORMIN (GLUCOPHAGE) 500 MG tablet Take 2 tablets by mouth 2 (two) times daily.  1   risedronate (ACTONEL) 150 MG tablet PLEASE SEE ATTACHED FOR DETAILED  DIRECTIONS 3 tablet 0   sitaGLIPtin (JANUVIA) 100 MG tablet Take 100 mg by mouth daily.     No current facility-administered medications for this visit.     ALLERGIES: Bee venom, Dapagliflozin, and Penicillins  Family History  Problem Relation Age of Onset   Diabetes Mother    Hypertension Mother    Heart disease Mother    Stroke Mother    Breast cancer Mother        Age 76   Lung cancer Father    Breast cancer Maternal Aunt        Age 64's   Diabetes Son    Heart attack Son     Social History   Socioeconomic History   Marital status: Divorced    Spouse name: Not on file   Number of children: Not on file   Years of education: Not on file   Highest education level: Not on file  Occupational History   Not on file  Tobacco Use   Smoking status: Never   Smokeless tobacco: Never  Vaping Use   Vaping Use: Never used  Substance and Sexual Activity   Alcohol use: No    Alcohol/week: 0.0 standard drinks of alcohol   Drug use: No   Sexual activity: Never    Birth control/protection: Post-menopausal, Surgical    Comment: First sexual encounter at age 29. Less than 5  partners in her life.  Other Topics Concern   Not on file  Social History Narrative   Not on file   Social Determinants of Health   Financial Resource Strain: Not on file  Food Insecurity: Not on file  Transportation Needs: Not on file  Physical Activity: Not on file  Stress: Not on file  Social Connections: Not on file  Intimate Partner Violence: Not on file    Review of Systems  All other systems reviewed and are negative.   PHYSICAL EXAMINATION:    BP 132/84   Ht '5\' 2"'$  (1.575 m)   Wt 150 lb (68 kg)   LMP  (LMP Unknown)   BMI 27.44 kg/m     General appearance: alert, cooperative and appears stated age   ASSESSMENT  Osteopenia with increased risk of fracture.  Doing well on monthly Actonel.   PLAN  Continue Actonel 150 mg po monthly.  Refills given until annual exam is due.  Continue vit D.  I discussed calcium intake through food choices.  We discussed ways to reduce risk of falls and fractures:  stable shoes, strength building exercise, night lights.  Next BMD in 2 years.  Annual exam in March, 2024.    An After Visit Summary was printed and given to the patient.  Sheila min  total time was spent for this patient encounter, including preparation, face-to-face counseling with the patient, coordination of care, and documentation of the encounter.

## 2022-07-21 ENCOUNTER — Ambulatory Visit (INDEPENDENT_AMBULATORY_CARE_PROVIDER_SITE_OTHER): Payer: Medicare Other | Admitting: Obstetrics and Gynecology

## 2022-07-21 ENCOUNTER — Encounter: Payer: Self-pay | Admitting: Obstetrics and Gynecology

## 2022-07-21 VITALS — BP 132/84 | Ht 62.0 in | Wt 150.0 lb

## 2022-07-21 DIAGNOSIS — Z5181 Encounter for therapeutic drug level monitoring: Secondary | ICD-10-CM | POA: Diagnosis not present

## 2022-07-21 DIAGNOSIS — M8580 Other specified disorders of bone density and structure, unspecified site: Secondary | ICD-10-CM | POA: Diagnosis not present

## 2022-07-21 MED ORDER — RISEDRONATE SODIUM 150 MG PO TABS: ORAL_TABLET | ORAL | 1 refills | Status: DC

## 2022-07-21 NOTE — Patient Instructions (Signed)
Calcium Content in Foods Calcium is the most abundant mineral in the body. Most of the body's calcium supply is stored in bones and teeth. Calcium helps many parts of the body function normally, including: Blood and blood vessels. Nerves. Hormones. Muscles. Bones and teeth. When your calcium stores are low, you may be at risk for low bone mass, bone loss, and broken bones (fractures). When you get enough calcium, it helps to support strong bones and teeth throughout your life. Calcium is especially important for: Children during growth spurts. Girls during adolescence. Women who are pregnant or breastfeeding. Women after their menstrual cycle stops (postmenopause). Women whose menstrual cycle has stopped due to anorexia nervosa or regular intense exercise. People who cannot eat or digest dairy products. Vegans. Recommended daily amounts of calcium: Women (ages 73 to 44): 1,000 mg per day. Women (ages 44 and older): 1,200 mg per day. Men (ages 35 to 74): 1,000 mg per day. Men (ages 87 and older): 1,200 mg per day. Women (ages 29 to 79): 1,300 mg per day. Men (ages 4 to 11): 1,300 mg per day. General information Eat foods that are high in calcium. Try to get most of your calcium from food. Some people may benefit from taking calcium supplements. Check with your health care provider or diet and nutrition specialist (dietitian) before starting any calcium supplements. Calcium supplements may interact with certain medicines. Too much calcium may cause other health problems, such as constipation and kidney stones. For the body to absorb calcium, it needs vitamin D. Sources of vitamin D include: Skin exposure to direct sunlight. Foods, such as egg yolks, liver, mushrooms, saltwater fish, and fortified milk. Vitamin D supplements. Check with your health care provider or dietitian before starting any vitamin D supplements. What foods are high in calcium?  Foods that are high in calcium contain  more than 100 milligrams per serving. Fruits Fortified orange juice or other fruit juice, 300 mg per 8 oz serving. Vegetables Collard greens, 360 mg per 8 oz serving. Kale, 100 mg per 8 oz serving. Bok choy, 160 mg per 8 oz serving. Grains Fortified ready-to-eat cereals, 100 to 1,000 mg per 8 oz serving. Fortified frozen waffles, 200 mg in 2 waffles. Oatmeal, 140 mg in 1 cup. Meats and other proteins Sardines, canned with bones, 325 mg per 3 oz serving. Salmon, canned with bones, 180 mg per 3 oz serving. Canned shrimp, 125 mg per 3 oz serving. Baked beans, 160 mg per 4 oz serving. Tofu, firm, made with calcium sulfate, 253 mg per 4 oz serving. Dairy Yogurt, plain, low-fat, 310 mg per 6 oz serving. Nonfat milk, 300 mg per 8 oz serving. American cheese, 195 mg per 1 oz serving. Cheddar cheese, 205 mg per 1 oz serving. Cottage cheese 2%, 105 mg per 4 oz serving. Fortified soy, rice, or almond milk, 300 mg per 8 oz serving. Mozzarella, part skim, 210 mg per 1 oz serving. The items listed above may not be a complete list of foods high in calcium. Actual amounts of calcium may be different depending on processing. Contact a dietitian for more information. What foods are lower in calcium? Foods that are lower in calcium contain 50 mg or less per serving. Fruits Apple, about 6 mg. Banana, about 12 mg. Vegetables Lettuce, 19 mg per 2 oz serving. Tomato, about 11 mg. Grains Rice, 4 mg per 6 oz serving. Boiled potatoes, 14 mg per 8 oz serving. White bread, 6 mg per slice. Meats and other proteins  Egg, 27 mg per 2 oz serving. Red meat, 7 mg per 4 oz serving. Chicken, 17 mg per 4 oz serving. Fish, cod, or trout, 20 mg per 4 oz serving. Dairy Cream cheese, regular, 14 mg per 1 Tbsp serving. Brie cheese, 50 mg per 1 oz serving. Parmesan cheese, 70 mg per 1 Tbsp serving. The items listed above may not be a complete list of foods lower in calcium. Actual amounts of calcium may be  different depending on processing. Contact a dietitian for more information. Summary Calcium is an important mineral in the body because it affects many functions. Getting enough calcium helps support strong bones and teeth throughout your life. Try to get most of your calcium from food. Calcium supplements may interact with certain medicines. Check with your health care provider or dietitian before starting any calcium supplements. This information is not intended to replace advice given to you by your health care provider. Make sure you discuss any questions you have with your health care provider. Document Revised: 03/19/2020 Document Reviewed: 03/19/2020 Elsevier Patient Education  2023 Elsevier Inc.  

## 2022-07-23 DIAGNOSIS — E1165 Type 2 diabetes mellitus with hyperglycemia: Secondary | ICD-10-CM | POA: Diagnosis not present

## 2022-07-23 DIAGNOSIS — I10 Essential (primary) hypertension: Secondary | ICD-10-CM | POA: Diagnosis not present

## 2022-07-23 DIAGNOSIS — E7800 Pure hypercholesterolemia, unspecified: Secondary | ICD-10-CM | POA: Diagnosis not present

## 2022-08-10 ENCOUNTER — Encounter (HOSPITAL_COMMUNITY): Payer: Self-pay | Admitting: Gastroenterology

## 2022-08-17 ENCOUNTER — Ambulatory Visit (HOSPITAL_COMMUNITY): Admit: 2022-08-17 | Payer: Medicare Other | Admitting: Gastroenterology

## 2022-08-17 ENCOUNTER — Encounter (HOSPITAL_COMMUNITY): Payer: Self-pay

## 2022-08-17 SURGERY — COLONOSCOPY WITH PROPOFOL
Anesthesia: Monitor Anesthesia Care

## 2022-08-20 ENCOUNTER — Telehealth (HOSPITAL_COMMUNITY): Payer: Self-pay | Admitting: *Deleted

## 2022-08-20 ENCOUNTER — Encounter: Payer: Self-pay | Admitting: Family Medicine

## 2022-08-20 DIAGNOSIS — I10 Essential (primary) hypertension: Secondary | ICD-10-CM | POA: Diagnosis not present

## 2022-08-20 DIAGNOSIS — E1165 Type 2 diabetes mellitus with hyperglycemia: Secondary | ICD-10-CM | POA: Diagnosis not present

## 2022-08-20 NOTE — Telephone Encounter (Signed)
Reaching out to patient to offer assistance regarding upcoming cardiac imaging study; pt verbalizes understanding of appt date/time, parking situation and where to check in, pre-test NPO status  and verified current allergies; name and call back number provided for further questions should they arise  Safaa Stingley RN Navigator Cardiac Imaging [facility] [phone] office [phone] cell  Patient aware to avoid caffeine 12 hours prior to her cardiac PET scan. 

## 2022-08-23 DIAGNOSIS — Z0000 Encounter for general adult medical examination without abnormal findings: Secondary | ICD-10-CM | POA: Diagnosis not present

## 2022-08-23 DIAGNOSIS — E1165 Type 2 diabetes mellitus with hyperglycemia: Secondary | ICD-10-CM | POA: Diagnosis not present

## 2022-08-23 DIAGNOSIS — E7800 Pure hypercholesterolemia, unspecified: Secondary | ICD-10-CM | POA: Diagnosis not present

## 2022-08-23 DIAGNOSIS — M19041 Primary osteoarthritis, right hand: Secondary | ICD-10-CM | POA: Diagnosis not present

## 2022-08-23 DIAGNOSIS — M8580 Other specified disorders of bone density and structure, unspecified site: Secondary | ICD-10-CM | POA: Diagnosis not present

## 2022-08-23 DIAGNOSIS — Z1389 Encounter for screening for other disorder: Secondary | ICD-10-CM | POA: Diagnosis not present

## 2022-08-23 DIAGNOSIS — I10 Essential (primary) hypertension: Secondary | ICD-10-CM | POA: Diagnosis not present

## 2022-08-24 ENCOUNTER — Encounter (HOSPITAL_COMMUNITY)
Admission: RE | Admit: 2022-08-24 | Discharge: 2022-08-24 | Disposition: A | Discharge status: Home / Self Care | Payer: Medicare Other | Source: Ambulatory Visit | Attending: Cardiology | Admitting: Cardiology

## 2022-08-24 DIAGNOSIS — I2510 Atherosclerotic heart disease of native coronary artery without angina pectoris: Secondary | ICD-10-CM

## 2022-08-24 DIAGNOSIS — Z01818 Encounter for other preprocedural examination: Secondary | ICD-10-CM | POA: Diagnosis not present

## 2022-08-24 MED ORDER — RUBIDIUM RB82 GENERATOR (RUBYFILL)
18.4300 | PACK | Freq: Once | INTRAVENOUS | Status: AC
  Administered 2022-08-24: 18.43 via INTRAVENOUS

## 2022-08-24 MED ORDER — RUBIDIUM RB82 GENERATOR (RUBYFILL)
18.3900 | PACK | Freq: Once | INTRAVENOUS | Status: AC
  Administered 2022-08-24: 18.39 via INTRAVENOUS

## 2022-08-24 MED ORDER — REGADENOSON 0.4 MG/5ML IV SOLN: 0.4000 mg | Freq: Once | INTRAVENOUS | Status: AC

## 2022-08-24 MED ORDER — REGADENOSON 0.4 MG/5ML IV SOLN
INTRAVENOUS | Status: AC
  Administered 2022-08-24: 0.4 mg via INTRAVENOUS
  Filled 2022-08-24: qty 5

## 2022-08-24 NOTE — Progress Notes (Signed)
Patient presents for a cardiac PET stress test and tolerated procedure without incident. Patient's heart rate went up to the 140's during the test. Patient only reports the feeling of needing the use the bathroom. After test, patient's HR was about 120's. We offered caffeine to the patient and monitored for the HR to come down prior to discharging. We called Dr. Margaretann Loveless who was agreeable to patient discharging.   Patient ambulated out of department with a steady gait.

## 2022-08-25 LAB — NM PET CT CARDIAC PERFUSION MULTI W/ABSOLUTE BLOODFLOW
LV dias vol: 67 mL (ref 46–106)
LV sys vol: 27 mL
MBFR: 2.05
Nuc Rest EF: 71 %
Peak HR: 150 {beats}/min
Rest HR: 89 {beats}/min
Rest MBF: 1.36 ml/g/min
Rest Nuclear Isotope Dose: 18.4 mCi
ST Depression (mm): 0 mm
Stress MBF: 2.79 ml/g/min
Stress Nuclear Isotope Dose: 18.4 mCi
TID: 1.14

## 2022-08-26 ENCOUNTER — Ambulatory Visit: Payer: Medicare Other | Attending: Cardiology | Admitting: Cardiology

## 2022-08-26 ENCOUNTER — Encounter: Payer: Self-pay | Admitting: Cardiology

## 2022-08-26 ENCOUNTER — Telehealth: Payer: Self-pay | Admitting: *Deleted

## 2022-08-26 VITALS — BP 118/80 | HR 109 | Ht 63.0 in | Wt 155.0 lb

## 2022-08-26 DIAGNOSIS — I10 Essential (primary) hypertension: Secondary | ICD-10-CM | POA: Diagnosis not present

## 2022-08-26 DIAGNOSIS — Z01812 Encounter for preprocedural laboratory examination: Secondary | ICD-10-CM | POA: Diagnosis not present

## 2022-08-26 DIAGNOSIS — E785 Hyperlipidemia, unspecified: Secondary | ICD-10-CM | POA: Diagnosis not present

## 2022-08-26 DIAGNOSIS — I2510 Atherosclerotic heart disease of native coronary artery without angina pectoris: Secondary | ICD-10-CM | POA: Diagnosis not present

## 2022-08-26 DIAGNOSIS — I350 Nonrheumatic aortic (valve) stenosis: Secondary | ICD-10-CM

## 2022-08-26 DIAGNOSIS — Z01818 Encounter for other preprocedural examination: Secondary | ICD-10-CM | POA: Diagnosis not present

## 2022-08-26 NOTE — Patient Instructions (Addendum)
[place] [facility] DEPT OF [place] A DEPT OF [place]. [facility] [place] [id number] [place] [address] Dept: [phone] Loc: [phone]  Sheila Keller  08/26/2022  You are scheduled for a Cardiac Catheterization on Wednesday, September 27 with Dr. Daneen Schick.  1. Please arrive at the [facility] (Main Entrance A) at [facility]: [address] [place], [place] [address] at 6:30 AM (This time is two hours before your procedure to ensure your preparation). Free valet parking service is available.   Special note: Every effort is made to have your procedure done on time. Please understand that emergencies sometimes delay scheduled procedures.  2. Diet: Do not eat solid foods after midnight.  The patient may have clear liquids until 5am upon the day of the procedure.  3. Labs: today (BMET, CBC)  4. Medication instructions in preparation for your procedure:   Contrast Allergy: No  Hold Jardiance AM of procedure  Hold Januvia AM of procedure   Do not take Diabetes Med Glucophage (Metformin) on the day of the procedure and HOLD 48 HOURS AFTER THE PROCEDURE.  On the morning of your procedure, take your Aspirin 81 mg and any morning medicines NOT listed above.  You may use sips of water.  5. Plan for one night stay--bring personal belongings. 6. Bring a current list of your medications and current insurance cards. 7. You MUST have a responsible person to drive you home. 8. Someone MUST be with you the first 24 hours after you arrive home or your discharge will be delayed. 9. Please wear clothes that are easy to get on and off and wear slip-on shoes.  Thank you for allowing [place] to care for you!   -- [facility]   Keep follow up as scheduled with Dr. Gardiner Rhyme

## 2022-08-26 NOTE — Telephone Encounter (Signed)
Patient informed. 

## 2022-08-26 NOTE — Telephone Encounter (Signed)
Actonel should not cause sodium problems or cardiac problems.   Her sodium level was 144, which is normal. Sheila Keller medicine on 08/23/22)

## 2022-08-26 NOTE — H&P (View-Only) (Signed)
Cardiology Office Note:    Date:  08/26/2022   ID:  Sheila Keller, DOB 17-Jan-1946, MRN [id number]  PCP:  Maury Dus, MD  Cardiologist:  None  Electrophysiologist:  None   Referring MD: Maury Dus, MD   Chief Complaint  Patient presents with   Coronary Artery Disease    History of Present Illness:    Sheila Keller is a 76 y.o. female with a hx of aortic stenosis, hypertension, diabetes, hyperlipidemia who presents for follow-up.  She was referred by Dr. Delilah Shan for evaluation of heart murmur.  She denied any chest pain, dyspnea, palpitations, LE edema, lightheadeness, or syncope.  For exercise she walks around yard for 20 minutes.  She denies any exertional symptoms.  Never smoked.  Mother had MI in 66s.   TTE on 03/05/2020 showed hyperdynamic LV systolic function, mild concentric LVH, grade 1 diastolic dysfunction, normal RV function, normal PASP, mild to moderate aortic stenosis.  Echocardiogram 03/17/2021 showed LVEF 70 to 10%, grade 1 diastolic dysfunction, normal RV function, moderate aortic stenosis.  Echocardiogram 03/30/2022 showed EF 70 to 07%, grade 1 diastolic dysfunction, LV mid cavitary gradient 22 mmHg (43 mmHg with Valsalva), normal RV function, moderate aortic stenosis (V-max 3 m/s, mean gradient 24 mmHg, AVA 0.8 cm, DI 0.34).  Cardiac PET scan on 08/24/2022 showed high risk findings, including lateral ischemia, decreased EF with stress, TID, severe three-vessel coronary calcifications, though absolute blood flows were normal.   Since last clinic vist, she reports has been doing okay.  Denies any chest pain or dyspnea.  No lightheadedness, syncope, lower extremity edema.   Past Medical History:  Diagnosis Date   Breast lump 08/2017   no cancer/ [facility]   Chronic cystitis    Diabetes mellitus    Elevated cholesterol    Heart murmur    Aortic valve stenosis.  Sees cardiologist with [facility]   Hypertension    Mild dysplasia of cervix (CIN I)     Osteopenia 02/2018   T score -1.8 FRAX 11% / 2.2%    Past Surgical History:  Procedure Laterality Date   BREAST BIOPSY Left    CATARACT EXTRACTION Bilateral    x2    CERVICAL BIOPSY  W/ LOOP ELECTRODE EXCISION  2009   CHOLECYSTECTOMY     COLPOSCOPY     DILATION AND CURETTAGE OF UTERUS     TUBAL LIGATION     vocal cord surgery     nodules excised    Current Medications: Current Meds  Medication Sig   ALPRAZolam (XANAX) 0.5 MG tablet    aspirin 81 MG chewable tablet Chew 81 mg by mouth every morning.   atorvastatin (LIPITOR) 80 MG tablet Take 1 tablet (80 mg total) by mouth daily.   Cholecalciferol (VITAMIN D3) 2000 units TABS Take 1 capsule by mouth every morning.   empagliflozin (JARDIANCE) 25 MG TABS tablet Take 25 mg by mouth daily.   glucose blood (ONETOUCH VERIO) test strip for use when checking blood sugars   losartan (COZAAR) 100 MG tablet Take 1 tablet (100 mg total) by mouth daily.   metFORMIN (GLUCOPHAGE) 500 MG tablet Take 2 tablets by mouth 2 (two) times daily.   risedronate (ACTONEL) 150 MG tablet PLEASE SEE ATTACHED FOR DETAILED DIRECTIONS   sitaGLIPtin (JANUVIA) 100 MG tablet Take 100 mg by mouth daily.     Allergies:   Bee venom, Dapagliflozin, and Penicillins   Social History   Socioeconomic History   Marital status: Divorced  Spouse name: Not on file   Number of children: Not on file   Years of education: Not on file   Highest education level: Not on file  Occupational History   Not on file  Tobacco Use   Smoking status: Never   Smokeless tobacco: Never  Vaping Use   Vaping Use: Never used  Substance and Sexual Activity   Alcohol use: No    Alcohol/week: 0.0 standard drinks of alcohol   Drug use: No   Sexual activity: Never    Birth control/protection: Post-menopausal, Surgical    Comment: First sexual encounter at age 61. Less than 5 partners in her life.  Other Topics Concern   Not on file  Social History Narrative   Not on file    Social Determinants of Health   Financial Resource Strain: Not on file  Food Insecurity: Not on file  Transportation Needs: Not on file  Physical Activity: Not on file  Stress: Not on file  Social Connections: Not on file     Family History: The patient's family history includes Breast cancer in her maternal aunt and mother; Diabetes in her mother and son; Heart attack in her son; Heart disease in her mother; Hypertension in her mother; Lung cancer in her father; Stroke in her mother.  ROS:   Please see the history of present illness.    All other systems reviewed and are negative.  EKGs/Labs/Other Studies Reviewed:    The following studies were reviewed today:   EKG:    08/26/2022: Sinus tachycardia, rate 109, no ST abnormalities 04/06/22:  Normal sinus rhythm, rate 101, no ST abnormalities 02/18/2020: normal sinus rhythm, rate 108, right axis deviation, no ST abnormalities, Q waves in III, aVF 04/01/2021: Sinus rhythm. Rate 88 bpm. No ST abnormalities.  Recent Labs: 06/10/2022: BUN 17; Creatinine, Ser 0.80; Potassium 4.5; Sodium 141  Recent Lipid Panel No results found for: "CHOL", "TRIG", "HDL", "CHOLHDL", "VLDL", "LDLCALC", "LDLDIRECT"  Physical Exam:    VS:  BP 118/80   Pulse (!) 109   Ht '5\' 3"'$  (1.6 m)   Wt 155 lb (70.3 kg)   LMP  (LMP Unknown)   SpO2 97%   BMI 27.46 kg/m     Wt Readings from Last 3 Encounters:  08/26/22 155 lb (70.3 kg)  07/21/22 150 lb (68 kg)  06/03/22 156 lb 6.4 oz (70.9 kg)     GEN:  in no acute distress HEENT: Normal NECK: No JVD CARDIAC: RRR, 3/6 systolic heart murmur loudest at the RUSB RESPIRATORY:  Clear to auscultation without rales, wheezing or rhonchi  ABDOMEN: Soft, non-tender, non-distended MUSCULOSKELETAL:  No edema; No deformity  SKIN: Warm and dry NEUROLOGIC:  Alert and oriented x 3 PSYCHIATRIC:  Normal affect   ASSESSMENT:    1. Coronary artery disease involving native coronary artery of native heart without angina  pectoris   2. Pre-op evaluation   3. Aortic valve stenosis, etiology of cardiac valve disease unspecified   4. Essential hypertension   5. Hyperlipidemia, unspecified hyperlipidemia type   6. Pre-procedure lab exam     PLAN:    CAD: Calcium score 1176 (95th percentile) on 05/25/2022.  Denies any chest pain or dyspnea.  Cardiac PET scan on 08/24/2022 showed high risk findings, including lateral ischemia, decreased EF with stress, TID, severe three-vessel coronary calcifications, though absolute blood flows were normal. -Continue aspirin 81 mg daily -Continue atorvastatin 80 mg daily -Given high risk findings on stress PET, recommend cardiac catheterization.  Risks and  benefits of cardiac catheterization have been discussed with the patient.  These include bleeding, infection, kidney damage, stroke, heart attack, death.  The patient understands these risks and is willing to proceed.  Preop evaluation: Prior to colonoscopy.  Low risk procedure, but given recent PET results as above, recommend cardiac catheterization prior to procedure  Aortic stenosis: Echocardiogram 03/17/2021 showed moderate aortic stenosis (V-max 3 m/s, mean gradient 20 mmHg, AVA 1.0 cm, DI 0.3).  Echocardiogram 03/30/2022 showed moderate aortic stenosis (V-max 3.0 m/s, mean gradient 24 mmHg, AVA 0.8 cm, DI 0.34) -Repeat echocardiogram in 1 year to monitor  Hypertension: On losartan-hydrochlorothiazide 50-6.25 mg.  Had significant mid cavitary gradient on echo, suspect she was a little dry.  Discontinued HCTZ, switched to just losartan 50 mg daily.    Hyperlipidemia: On atorvastatin 40 mg daily,  LDL 72 on 01/27/2022.  Calcium score 1176 on 05/25/2022.  Atorvastatin increased to 80 mg daily  Type 2 diabetes: On Metformin, Januvia, Jardiance.  A1c 7.4 on 08/2022   RTC in 1 month    Medication Adjustments/Labs and Tests Ordered: Current medicines are reviewed at length with the patient today.  Concerns regarding medicines  are outlined above.  Orders Placed This Encounter  Procedures   Basic metabolic panel   CBC   EKG 12-Lead   No orders of the defined types were placed in this encounter.   Patient Instructions  [place] A DEPT OF [place] A DEPT OF [place]. [facility] [place] [id number] [place] [address] Dept: [phone] Loc: [phone]  IVANELL DESHOTEL  08/26/2022  You are scheduled for a Cardiac Catheterization on Wednesday, September 27 with Dr. Daneen Schick.  1. Please arrive at the [facility] (Main Entrance A) at [facility]: [address] [place], [place] [address] at 6:30 AM (This time is two hours before your procedure to ensure your preparation). Free valet parking service is available.   Special note: Every effort is made to have your procedure done on time. Please understand that emergencies sometimes delay scheduled procedures.  2. Diet: Do not eat solid foods after midnight.  The patient may have clear liquids until 5am upon the day of the procedure.  3. Labs: today (BMET, CBC)  4. Medication instructions in preparation for your procedure:   Contrast Allergy: No  Hold Jardiance AM of procedure  Hold Januvia AM of procedure   Do not take Diabetes Med Glucophage (Metformin) on the day of the procedure and HOLD 48 HOURS AFTER THE PROCEDURE.  On the morning of your procedure, take your Aspirin 81 mg and any morning medicines NOT listed above.  You may use sips of water.  5. Plan for one night stay--bring personal belongings. 6. Bring a current list of your medications and current insurance cards. 7. You MUST have a responsible person to drive you home. 8. Someone MUST be with you the first 24 hours after you arrive home or your discharge will be delayed. 9. Please wear clothes that are easy to get on and off and wear slip-on shoes.  Thank you for allowing [place] to  care for you!   -- [facility]   Keep follow up as scheduled with Dr. Gardiner Rhyme       Signed, Donato Heinz, MD  08/26/2022 6:43 PM    [place]

## 2022-08-26 NOTE — Progress Notes (Signed)
Cardiology Office Note:    Date:  08/26/2022   ID:  Sheila Keller, DOB 20-Jan-1946, MRN [id number]  PCP:  Maury Dus, MD  Cardiologist:  None  Electrophysiologist:  None   Referring MD: Maury Dus, MD   Chief Complaint  Patient presents with   Coronary Artery Disease    History of Present Illness:    Sheila Keller is a 76 y.o. female with a hx of aortic stenosis, hypertension, diabetes, hyperlipidemia who presents for follow-up.  She was referred by Dr. Delilah Shan for evaluation of heart murmur.  She denied any chest pain, dyspnea, palpitations, LE edema, lightheadeness, or syncope.  For exercise she walks around yard for 20 minutes.  She denies any exertional symptoms.  Never smoked.  Mother had MI in 15s.   TTE on 03/05/2020 showed hyperdynamic LV systolic function, mild concentric LVH, grade 1 diastolic dysfunction, normal RV function, normal PASP, mild to moderate aortic stenosis.  Echocardiogram 03/17/2021 showed LVEF 70 to 63%, grade 1 diastolic dysfunction, normal RV function, moderate aortic stenosis.  Echocardiogram 03/30/2022 showed EF 70 to 78%, grade 1 diastolic dysfunction, LV mid cavitary gradient 22 mmHg (43 mmHg with Valsalva), normal RV function, moderate aortic stenosis (V-max 3 m/s, mean gradient 24 mmHg, AVA 0.8 cm, DI 0.34).  Cardiac PET scan on 08/24/2022 showed high risk findings, including lateral ischemia, decreased EF with stress, TID, severe three-vessel coronary calcifications, though absolute blood flows were normal.   Since last clinic vist, she reports has been doing okay.  Denies any chest pain or dyspnea.  No lightheadedness, syncope, lower extremity edema.   Past Medical History:  Diagnosis Date   Breast lump 08/2017   no cancer/ [facility]   Chronic cystitis    Diabetes mellitus    Elevated cholesterol    Heart murmur    Aortic valve stenosis.  Sees cardiologist with [facility]   Hypertension    Mild dysplasia of cervix (CIN I)     Osteopenia 02/2018   T score -1.8 FRAX 11% / 2.2%    Past Surgical History:  Procedure Laterality Date   BREAST BIOPSY Left    CATARACT EXTRACTION Bilateral    x2    CERVICAL BIOPSY  W/ LOOP ELECTRODE EXCISION  2009   CHOLECYSTECTOMY     COLPOSCOPY     DILATION AND CURETTAGE OF UTERUS     TUBAL LIGATION     vocal cord surgery     nodules excised    Current Medications: Current Meds  Medication Sig   ALPRAZolam (XANAX) 0.5 MG tablet    aspirin 81 MG chewable tablet Chew 81 mg by mouth every morning.   atorvastatin (LIPITOR) 80 MG tablet Take 1 tablet (80 mg total) by mouth daily.   Cholecalciferol (VITAMIN D3) 2000 units TABS Take 1 capsule by mouth every morning.   empagliflozin (JARDIANCE) 25 MG TABS tablet Take 25 mg by mouth daily.   glucose blood (ONETOUCH VERIO) test strip for use when checking blood sugars   losartan (COZAAR) 100 MG tablet Take 1 tablet (100 mg total) by mouth daily.   metFORMIN (GLUCOPHAGE) 500 MG tablet Take 2 tablets by mouth 2 (two) times daily.   risedronate (ACTONEL) 150 MG tablet PLEASE SEE ATTACHED FOR DETAILED DIRECTIONS   sitaGLIPtin (JANUVIA) 100 MG tablet Take 100 mg by mouth daily.     Allergies:   Bee venom, Dapagliflozin, and Penicillins   Social History   Socioeconomic History   Marital status: Divorced  Spouse name: Not on file   Number of children: Not on file   Years of education: Not on file   Highest education level: Not on file  Occupational History   Not on file  Tobacco Use   Smoking status: Never   Smokeless tobacco: Never  Vaping Use   Vaping Use: Never used  Substance and Sexual Activity   Alcohol use: No    Alcohol/week: 0.0 standard drinks of alcohol   Drug use: No   Sexual activity: Never    Birth control/protection: Post-menopausal, Surgical    Comment: First sexual encounter at age 62. Less than 5 partners in her life.  Other Topics Concern   Not on file  Social History Narrative   Not on file    Social Determinants of Health   Financial Resource Strain: Not on file  Food Insecurity: Not on file  Transportation Needs: Not on file  Physical Activity: Not on file  Stress: Not on file  Social Connections: Not on file     Family History: The patient's family history includes Breast cancer in her maternal aunt and mother; Diabetes in her mother and son; Heart attack in her son; Heart disease in her mother; Hypertension in her mother; Lung cancer in her father; Stroke in her mother.  ROS:   Please see the history of present illness.    All other systems reviewed and are negative.  EKGs/Labs/Other Studies Reviewed:    The following studies were reviewed today:   EKG:    08/26/2022: Sinus tachycardia, rate 109, no ST abnormalities 04/06/22:  Normal sinus rhythm, rate 101, no ST abnormalities 02/18/2020: normal sinus rhythm, rate 108, right axis deviation, no ST abnormalities, Q waves in III, aVF 04/01/2021: Sinus rhythm. Rate 88 bpm. No ST abnormalities.  Recent Labs: 06/10/2022: BUN 17; Creatinine, Ser 0.80; Potassium 4.5; Sodium 141  Recent Lipid Panel No results found for: "CHOL", "TRIG", "HDL", "CHOLHDL", "VLDL", "LDLCALC", "LDLDIRECT"  Physical Exam:    VS:  BP 118/80   Pulse (!) 109   Ht '5\' 3"'$  (1.6 m)   Wt 155 lb (70.3 kg)   LMP  (LMP Unknown)   SpO2 97%   BMI 27.46 kg/m     Wt Readings from Last 3 Encounters:  08/26/22 155 lb (70.3 kg)  07/21/22 150 lb (68 kg)  06/03/22 156 lb 6.4 oz (70.9 kg)     GEN:  in no acute distress HEENT: Normal NECK: No JVD CARDIAC: RRR, 3/6 systolic heart murmur loudest at the RUSB RESPIRATORY:  Clear to auscultation without rales, wheezing or rhonchi  ABDOMEN: Soft, non-tender, non-distended MUSCULOSKELETAL:  No edema; No deformity  SKIN: Warm and dry NEUROLOGIC:  Alert and oriented x 3 PSYCHIATRIC:  Normal affect   ASSESSMENT:    1. Coronary artery disease involving native coronary artery of native heart without angina  pectoris   2. Pre-op evaluation   3. Aortic valve stenosis, etiology of cardiac valve disease unspecified   4. Essential hypertension   5. Hyperlipidemia, unspecified hyperlipidemia type   6. Pre-procedure lab exam     PLAN:    CAD: Calcium score 1176 (95th percentile) on 05/25/2022.  Denies any chest pain or dyspnea.  Cardiac PET scan on 08/24/2022 showed high risk findings, including lateral ischemia, decreased EF with stress, TID, severe three-vessel coronary calcifications, though absolute blood flows were normal. -Continue aspirin 81 mg daily -Continue atorvastatin 80 mg daily -Given high risk findings on stress PET, recommend cardiac catheterization.  Risks and  benefits of cardiac catheterization have been discussed with the patient.  These include bleeding, infection, kidney damage, stroke, heart attack, death.  The patient understands these risks and is willing to proceed.  Preop evaluation: Prior to colonoscopy.  Low risk procedure, but given recent PET results as above, recommend cardiac catheterization prior to procedure  Aortic stenosis: Echocardiogram 03/17/2021 showed moderate aortic stenosis (V-max 3 m/s, mean gradient 20 mmHg, AVA 1.0 cm, DI 0.3).  Echocardiogram 03/30/2022 showed moderate aortic stenosis (V-max 3.0 m/s, mean gradient 24 mmHg, AVA 0.8 cm, DI 0.34) -Repeat echocardiogram in 1 year to monitor  Hypertension: On losartan-hydrochlorothiazide 50-6.25 mg.  Had significant mid cavitary gradient on echo, suspect she was a little dry.  Discontinued HCTZ, switched to just losartan 50 mg daily.    Hyperlipidemia: On atorvastatin 40 mg daily,  LDL 72 on 01/27/2022.  Calcium score 1176 on 05/25/2022.  Atorvastatin increased to 80 mg daily  Type 2 diabetes: On Metformin, Januvia, Jardiance.  A1c 7.4 on 08/2022   RTC in 1 month    Medication Adjustments/Labs and Tests Ordered: Current medicines are reviewed at length with the patient today.  Concerns regarding medicines  are outlined above.  Orders Placed This Encounter  Procedures   Basic metabolic panel   CBC   EKG 12-Lead   No orders of the defined types were placed in this encounter.   Patient Instructions  [place] A DEPT OF [place] A DEPT OF [place]. [facility] [place] [id number] [place] [address] Dept: [phone] Loc: [phone]  SHANTE ARCHAMBEAULT  08/26/2022  You are scheduled for a Cardiac Catheterization on Wednesday, September 27 with Dr. Daneen Schick.  1. Please arrive at the [facility] (Main Entrance A) at [facility]: [address] [place], [place] [address] at 6:30 AM (This time is two hours before your procedure to ensure your preparation). Free valet parking service is available.   Special note: Every effort is made to have your procedure done on time. Please understand that emergencies sometimes delay scheduled procedures.  2. Diet: Do not eat solid foods after midnight.  The patient may have clear liquids until 5am upon the day of the procedure.  3. Labs: today (BMET, CBC)  4. Medication instructions in preparation for your procedure:   Contrast Allergy: No  Hold Jardiance AM of procedure  Hold Januvia AM of procedure   Do not take Diabetes Med Glucophage (Metformin) on the day of the procedure and HOLD 48 HOURS AFTER THE PROCEDURE.  On the morning of your procedure, take your Aspirin 81 mg and any morning medicines NOT listed above.  You may use sips of water.  5. Plan for one night stay--bring personal belongings. 6. Bring a current list of your medications and current insurance cards. 7. You MUST have a responsible person to drive you home. 8. Someone MUST be with you the first 24 hours after you arrive home or your discharge will be delayed. 9. Please wear clothes that are easy to get on and off and wear slip-on shoes.  Thank you for allowing [place] to  care for you!   -- [facility]   Keep follow up as scheduled with Dr. Gardiner Rhyme       Signed, Donato Heinz, MD  08/26/2022 6:43 PM    [place]

## 2022-08-26 NOTE — Telephone Encounter (Signed)
Left message for patient to call.

## 2022-08-26 NOTE — Telephone Encounter (Signed)
Patient called stating she had labs drawn at her PCP Dr.Reade office recently and her sodium levels are elevated Patient asked if this could be related to her taking the Actonel 150 mg, she also mentioned "heart problems" Dr. Gardiner Rhyme is her cardiologist ( notes in epic) I called [facility] and asked them to send a copy of lab via fax.

## 2022-08-27 LAB — BASIC METABOLIC PANEL
BUN/Creatinine Ratio: 15 (ref 12–28)
BUN: 19 mg/dL (ref 8–27)
CO2: 19 mmol/L — ABNORMAL LOW (ref 20–29)
Calcium: 9.6 mg/dL (ref 8.7–10.3)
Chloride: 104 mmol/L (ref 96–106)
Creatinine, Ser: 1.23 mg/dL — ABNORMAL HIGH (ref 0.57–1.00)
Glucose: 218 mg/dL — ABNORMAL HIGH (ref 70–99)
Potassium: 4.4 mmol/L (ref 3.5–5.2)
Sodium: 143 mmol/L (ref 134–144)
eGFR: 46 mL/min/{1.73_m2} — ABNORMAL LOW (ref >59)

## 2022-08-27 LAB — CBC
Hematocrit: 38.1 % (ref 34.0–46.6)
Hemoglobin: 12.7 g/dL (ref 11.1–15.9)
MCH: 30.2 pg (ref 26.6–33.0)
MCHC: 33.3 g/dL (ref 31.5–35.7)
MCV: 91 fL (ref 79–97)
Platelets: 227 10*3/uL (ref 150–450)
RBC: 4.2 x10E6/uL (ref 3.77–5.28)
RDW: 13 % (ref 11.7–15.4)
WBC: 4.4 10*3/uL (ref 3.4–10.8)

## 2022-08-30 ENCOUNTER — Telehealth: Payer: Self-pay | Admitting: Cardiology

## 2022-08-30 NOTE — Telephone Encounter (Signed)
Pt returning call for lab results  

## 2022-08-30 NOTE — Telephone Encounter (Signed)
Discussed results, patient verbalized understanding.    Patient wondering if she needs to get her nails and polish taken off prior to procedure.  Per Webb Silversmith RN (pre procedure nurse)-ok to have polish+nails.    Patient aware.

## 2022-08-31 ENCOUNTER — Telehealth: Payer: Self-pay | Admitting: *Deleted

## 2022-08-31 ENCOUNTER — Ambulatory Visit: Payer: Medicare Other | Admitting: Physician Assistant

## 2022-08-31 NOTE — H&P (Signed)
Cardiac PET scan on 08/24/2022 showed high risk findings, including lateral ischemia, decreased EF with stress, TID, severe three-vessel coronary calcifications, though absolute blood flows were normal.\  Creatinine 1.23 Hemoglobin 12.7 Moderate aortic stenosis with valve area 1.0 cm and mean gradient 24 mmHg Diabetes mellitus Hyperlipidemia Hypertension

## 2022-08-31 NOTE — Telephone Encounter (Addendum)
Cardiac Catheterization scheduled at [facility] for: Wednesday September 27,2023 8:30 AM Arrival time and place: [place] Entrance A at: 6:30 AM  Nothing to eat after midnight prior to procedure, clear liquids until 5 AM day of procedure.  Medication instructions: -Hold:  Losartan -day before and day of procedure-per protocol GFR 46-already taken today.  Metformin-day of procedure and 48 hours post procedure  Jardiance/Januvia-AM of procedure  -Except hold medications usual morning medications can be taken with sips of water including aspirin 81 mg.  Confirmed patient has responsible adult to drive home post procedure and be with patient first 24 hours after arriving home.  Patient reports no new symptoms concerning for COVID-19 in the past 10 days.  Reviewed procedure instructions with patient.  Reminded patient to stay well hydrated today.

## 2022-09-01 ENCOUNTER — Encounter (HOSPITAL_COMMUNITY): Payer: Self-pay | Admitting: Interventional Cardiology

## 2022-09-01 ENCOUNTER — Ambulatory Visit (HOSPITAL_COMMUNITY)
Admission: RE | Admit: 2022-09-01 | Discharge: 2022-09-01 | Disposition: A | Discharge status: Home / Self Care | Payer: Medicare Other | Attending: Interventional Cardiology | Admitting: Interventional Cardiology

## 2022-09-01 ENCOUNTER — Encounter (HOSPITAL_COMMUNITY)
Admission: RE | Disposition: A | Discharge status: Home / Self Care | Payer: Self-pay | Source: Home / Self Care | Attending: Interventional Cardiology

## 2022-09-01 ENCOUNTER — Other Ambulatory Visit: Payer: Self-pay

## 2022-09-01 DIAGNOSIS — E119 Type 2 diabetes mellitus without complications: Secondary | ICD-10-CM

## 2022-09-01 DIAGNOSIS — Z7984 Long term (current) use of oral hypoglycemic drugs: Secondary | ICD-10-CM | POA: Diagnosis not present

## 2022-09-01 DIAGNOSIS — E785 Hyperlipidemia, unspecified: Secondary | ICD-10-CM | POA: Insufficient documentation

## 2022-09-01 DIAGNOSIS — I10 Essential (primary) hypertension: Secondary | ICD-10-CM | POA: Diagnosis not present

## 2022-09-01 DIAGNOSIS — E7800 Pure hypercholesterolemia, unspecified: Secondary | ICD-10-CM | POA: Diagnosis present

## 2022-09-01 DIAGNOSIS — I2510 Atherosclerotic heart disease of native coronary artery without angina pectoris: Secondary | ICD-10-CM | POA: Insufficient documentation

## 2022-09-01 DIAGNOSIS — Z79899 Other long term (current) drug therapy: Secondary | ICD-10-CM | POA: Insufficient documentation

## 2022-09-01 DIAGNOSIS — Z7982 Long term (current) use of aspirin: Secondary | ICD-10-CM | POA: Insufficient documentation

## 2022-09-01 DIAGNOSIS — I350 Nonrheumatic aortic (valve) stenosis: Secondary | ICD-10-CM | POA: Diagnosis not present

## 2022-09-01 HISTORY — PX: LEFT HEART CATH AND CORONARY ANGIOGRAPHY: CATH118249

## 2022-09-01 LAB — GLUCOSE, CAPILLARY: Glucose-Capillary: 129 mg/dL — ABNORMAL HIGH (ref 70–99)

## 2022-09-01 SURGERY — LEFT HEART CATH AND CORONARY ANGIOGRAPHY
Anesthesia: LOCAL

## 2022-09-01 MED ORDER — HEPARIN (PORCINE) IN NACL 1000-0.9 UT/500ML-% IV SOLN
INTRAVENOUS | Status: DC | PRN
  Administered 2022-09-01 (×2): 500 mL

## 2022-09-01 MED ORDER — MIDAZOLAM HCL 2 MG/2ML IJ SOLN
INTRAMUSCULAR | Status: DC | PRN
  Administered 2022-09-01: 1 mg via INTRAVENOUS

## 2022-09-01 MED ORDER — HEPARIN SODIUM (PORCINE) 1000 UNIT/ML IJ SOLN
INTRAMUSCULAR | Status: AC
  Filled 2022-09-01: qty 10

## 2022-09-01 MED ORDER — ASPIRIN 81 MG PO CHEW
81.0000 mg | CHEWABLE_TABLET | ORAL | Status: AC
  Administered 2022-09-01: 81 mg via ORAL

## 2022-09-01 MED ORDER — LIDOCAINE HCL (PF) 1 % IJ SOLN
INTRAMUSCULAR | Status: AC
  Filled 2022-09-01: qty 30

## 2022-09-01 MED ORDER — SODIUM CHLORIDE 0.9 % WEIGHT BASED INFUSION
3.0000 mL/kg/h | INTRAVENOUS | Status: AC
  Administered 2022-09-01: 3 mL/kg/h via INTRAVENOUS

## 2022-09-01 MED ORDER — SODIUM CHLORIDE 0.9% FLUSH: 3.0000 mL | Freq: Two times a day (BID) | INTRAVENOUS | Status: DC

## 2022-09-01 MED ORDER — MIDAZOLAM HCL 2 MG/2ML IJ SOLN
INTRAMUSCULAR | Status: AC
  Filled 2022-09-01: qty 2

## 2022-09-01 MED ORDER — FENTANYL CITRATE (PF) 100 MCG/2ML IJ SOLN
INTRAMUSCULAR | Status: AC
  Filled 2022-09-01: qty 2

## 2022-09-01 MED ORDER — HEPARIN SODIUM (PORCINE) 1000 UNIT/ML IJ SOLN
INTRAMUSCULAR | Status: DC | PRN
  Administered 2022-09-01: 3500 [IU] via INTRAVENOUS

## 2022-09-01 MED ORDER — SODIUM CHLORIDE 0.9 % IV SOLN: 250.0000 mL | INTRAVENOUS | Status: DC | PRN

## 2022-09-01 MED ORDER — HEPARIN (PORCINE) IN NACL 1000-0.9 UT/500ML-% IV SOLN
INTRAVENOUS | Status: AC
  Filled 2022-09-01: qty 1000

## 2022-09-01 MED ORDER — FENTANYL CITRATE (PF) 100 MCG/2ML IJ SOLN
INTRAMUSCULAR | Status: DC | PRN
  Administered 2022-09-01: 25 ug via INTRAVENOUS

## 2022-09-01 MED ORDER — VERAPAMIL HCL 2.5 MG/ML IV SOLN
INTRAVENOUS | Status: DC | PRN
  Administered 2022-09-01: 10 mL via INTRA_ARTERIAL

## 2022-09-01 MED ORDER — SODIUM CHLORIDE 0.9% FLUSH: 3.0000 mL | INTRAVENOUS | Status: DC | PRN

## 2022-09-01 MED ORDER — LIDOCAINE HCL (PF) 1 % IJ SOLN
INTRAMUSCULAR | Status: DC | PRN
  Administered 2022-09-01: 2 mL

## 2022-09-01 MED ORDER — VERAPAMIL HCL 2.5 MG/ML IV SOLN
INTRAVENOUS | Status: AC
  Filled 2022-09-01: qty 2

## 2022-09-01 MED ORDER — IOHEXOL 350 MG/ML SOLN
INTRAVENOUS | Status: DC | PRN
  Administered 2022-09-01: 21 mL

## 2022-09-01 MED ORDER — SODIUM CHLORIDE 0.9 % WEIGHT BASED INFUSION: 1.0000 mL/kg/h | INTRAVENOUS | Status: DC

## 2022-09-01 SURGICAL SUPPLY — 9 items
CATH 5FR JL3.5 JR4 ANG PIG MP (CATHETERS) IMPLANT
GLIDESHEATH SLEND A-KIT 6F 22G (SHEATH) IMPLANT
GUIDEWIRE INQWIRE 1.5J.035X260 (WIRE) IMPLANT
INQWIRE 1.5J .035X260CM (WIRE) ×1
KIT HEART LEFT (KITS) ×1 IMPLANT
KIT SYRINGE INJ CVI SPIKEX1 (MISCELLANEOUS) IMPLANT
PACK CARDIAC CATHETERIZATION (CUSTOM PROCEDURE TRAY) ×1 IMPLANT
SET ATX SIMPLICITY (MISCELLANEOUS) IMPLANT
SHEATH PROBE COVER 6X72 (BAG) IMPLANT

## 2022-09-01 NOTE — Interval H&P Note (Signed)
Cath Lab Visit (complete for each Cath Lab visit)  Clinical Evaluation Leading to the Procedure:   ACS: No.  Non-ACS:    Anginal Classification: CCS II  Anti-ischemic medical therapy: Minimal Therapy (1 class of medications)  Non-Invasive Test Results: Intermediate-risk stress test findings: cardiac mortality 1-3%/year  Prior CABG: No previous CABG      History and Physical Interval Note:  09/01/2022 7:23 AM  Vear B Huntley  has presented today for surgery, with the diagnosis of abnormal cardiac pet scan.  The various methods of treatment have been discussed with the patient and family. After consideration of risks, benefits and other options for treatment, the patient has consented to  Procedure(s): LEFT HEART CATH AND CORONARY ANGIOGRAPHY (N/A) as a surgical intervention.  The patient's history has been reviewed, patient examined, no change in status, stable for surgery.  I have reviewed the patient's chart and labs.  Questions were answered to the patient's satisfaction.     Belva Crome III

## 2022-09-03 ENCOUNTER — Telehealth: Payer: Self-pay | Admitting: Cardiology

## 2022-09-03 NOTE — Telephone Encounter (Signed)
Patient is calling about her hand about the dressing.  She also wants to talk about her fingers, she has them on a board, she wants to know if she still needs to keep them on the board.  She states they didn't go into detail at the hospital about the dressing.

## 2022-09-03 NOTE — Telephone Encounter (Signed)
  Pt is calling back, she said her friend is a retired Marine scientist and helping her cleaning her wound, her friend will leave in the next 15 mins and asking if someone cn call them before that

## 2022-09-03 NOTE — Telephone Encounter (Signed)
Spoke with patient who passed the phone to her friend a retired Industrial/product designer [employer]. Ann stated the patient's rt wrist cath site has bruising, but no drainage. Patient's radial pulse and cap refill are normal. Denies numbness or tingling in hand/fingers. Does report the armboard is bothersome. Ann cleansed the site and asked what type of dressing should be applied. I recommended a Band-Aid. Reviewed with Lelon Frohlich the AVS instructions. Recommended patient can reapply armboard as a reminder to protect the wrist, if patient desires.

## 2022-09-03 NOTE — Consult Note (Signed)
HEART TEAM DISCUSSION 09/03/2022  Attendees: Daneen Schick (presenter); Lianne Bushy (primary cardiologist); Kathlyn Sacramento (lead); Melodie Bouillon; Maneesh Patwardhan; Sherren Mocha; Christen ButterLendell Caprice.  Data presented: The relevant clinical history of diabetes mellitus type 2, hyperlipidemia, hypertension, and moderate aortic stenosis was noted.  Studies reviewed included the coronary angiography performed on 09/02/2022 demonstrating calcified distal left main stenosis, coronary calcium score (1200), the most recent echocardiogram (03/30/2022), and the nuclear medicine PET CT perfusion study (08/24/2022).  The coronary anatomy and high risk features on the PET perfusion imaging (TID, lateral ischemia, drop in EF with pharmacologic stress, high resting coronary blood flow, and borderline myocardial blood flow reserve of 2.05) were noted and discussed.  The possibility of LIMA to LAD atresia in this circumstance was discussed.  PCI options were considered but only briefly discussed.  Combined two-vessel coronary bypass grafting and aortic valve replacement was discussed as a guideline recommended therapy.  OPINION: The patient will be referred to the [facility] for consideration of coronary artery bypass grafting plus minus aortic valve replacement.  Plan: The patient will be notified of results of discussion, the primary cardiologist was present at the meeting, and she will be encouraged to follow-up with [facility] to further discuss the above treatment options.

## 2022-09-06 NOTE — Telephone Encounter (Signed)
Should not need to use the armboard anymore at this point

## 2022-09-09 ENCOUNTER — Encounter: Payer: Self-pay | Admitting: Cardiothoracic Surgery

## 2022-09-09 ENCOUNTER — Institutional Professional Consult (permissible substitution): Payer: Medicare Other | Admitting: Cardiothoracic Surgery

## 2022-09-09 DIAGNOSIS — I25119 Atherosclerotic heart disease of native coronary artery with unspecified angina pectoris: Secondary | ICD-10-CM

## 2022-09-09 DIAGNOSIS — I2510 Atherosclerotic heart disease of native coronary artery without angina pectoris: Secondary | ICD-10-CM | POA: Insufficient documentation

## 2022-09-10 ENCOUNTER — Ambulatory Visit: Payer: Medicare Other

## 2022-09-10 NOTE — Telephone Encounter (Signed)
Appt with [facility] 10/5

## 2022-09-11 NOTE — Progress Notes (Signed)
[address]       [place] [address]             [phone]        Genasis B [facility]ner [place] Medical Record #[id number] Date of Birth: 28-Oct-1946  Referring: Belva Crome, MD Primary Care: Maury Dus, MD Primary Cardiologist: Donato Heinz, MD  Chief Complaint:    Chief Complaint  Patient presents with   Coronary Artery Disease    New patient consultation, CATH 9/27, ECHO 5/2    History of Present Illness:     Sheila Keller is a 76 y.o. female with a hx of aortic stenosis, hypertension, diabetes, hyperlipidemia who presents for CT surgical evaluation of left main coronary disease and aortic stenosis.  She has been followed for AS for years and recently was diagnosed with severe left main. Further history below:    She was referred to Dr. Gardiner Rhyme  for evaluation of heart murmur.  At that time, she denied any chest pain, dyspnea, palpitations, LE edema, lightheadeness, or syncope.  For exercise she walks around yard for 20 minutes.  She denied any exertional symptoms.  Never smoked.  Mother had MI in 28s.   TTE on 03/05/2020 showed hyperdynamic LV systolic function, mild concentric LVH, grade 1 diastolic dysfunction, normal RV function, normal PASP, mild to moderate aortic stenosis.  Echocardiogram 03/17/2021 showed LVEF 70 to 05%, grade 1 diastolic dysfunction, normal RV function, moderate aortic stenosis.  Echocardiogram 03/30/2022 showed EF 70 to 39%, grade 1 diastolic dysfunction, LV mid cavitary gradient 22 mmHg (43 mmHg with Valsalva), normal RV function, moderate aortic stenosis (V-max 3 m/s, mean gradient 24 mmHg, AVA 0.8 cm, DI 0.34).  She underwent a CT calcium scoring on 05/25/22 for a total score of 1176. This was 95 percentile for age-, race-, and sex-matched controls.    She then underwent    Current Activity/ Functional Status: {functional status:19517}   Past Medical History:  Diagnosis Date   Breast lump 08/2017   no  cancer/ [facility]   Chronic cystitis    Diabetes mellitus    Elevated cholesterol    Heart murmur    Aortic valve stenosis.  Sees cardiologist with [facility]   Hypertension    Mild dysplasia of cervix (CIN I)    Osteopenia 02/2018   T score -1.8 FRAX 11% / 2.2%    Past Surgical History:  Procedure Laterality Date   BREAST BIOPSY Left    CATARACT EXTRACTION Bilateral    x2    CERVICAL BIOPSY  W/ LOOP ELECTRODE EXCISION  2009   CHOLECYSTECTOMY     COLPOSCOPY     DILATION AND CURETTAGE OF UTERUS     LEFT HEART CATH AND CORONARY ANGIOGRAPHY N/A 09/01/2022   Procedure: LEFT HEART CATH AND CORONARY ANGIOGRAPHY;  Surgeon: Belva Crome, MD;  Location: [facility];  Service: Cardiovascular;  Laterality: N/A;   TUBAL LIGATION     vocal cord surgery     nodules excised    Social History   Tobacco Use  Smoking Status Never  Smokeless Tobacco Never    Social History   Substance and Sexual Activity  Alcohol Use No   Alcohol/week: 0.0 standard drinks of alcohol     Allergies  Allergen Reactions   Bee Venom Hives   Farxiga [Dapagliflozin] Other (See Comments)    Pt is unaware of allergy or reaction    Penicillins  Has patient had a PCN reaction causing immediate rash, facial/tongue/throat swelling, SOB or lightheadedness with hypotension: Yes Has patient had a PCN reaction causing severe rash involving mucus membranes or skin necrosis: Yes Has patient had a PCN reaction that required hospitalization No Has patient had a PCN reaction occurring within the last 10 years: No If all of the above answers are "NO", then may proceed with Cephalosporin use.     Current Outpatient Medications  Medication Sig Dispense Refill   ALPRAZolam (XANAX) 0.5 MG tablet Take 0.5 mg by mouth 2 (two) times daily as needed for anxiety.  1   aspirin EC 81 MG tablet Chew 81 mg by mouth every morning.     atorvastatin (LIPITOR) 80 MG tablet Take 1 tablet (80 mg total) by mouth  daily. 90 tablet 3   Cholecalciferol (VITAMIN D3) 2000 units TABS Take 2,000 Units by mouth every morning.     empagliflozin (JARDIANCE) 25 MG TABS tablet Take 25 mg by mouth daily.     glucose blood (ONETOUCH VERIO) test strip for use when checking blood sugars     losartan (COZAAR) 100 MG tablet Take 1 tablet (100 mg total) by mouth daily. 90 tablet 3   metFORMIN (GLUCOPHAGE) 500 MG tablet Take 1,000 mg by mouth 2 (two) times daily with a meal.  1   risedronate (ACTONEL) 150 MG tablet PLEASE SEE ATTACHED FOR DETAILED DIRECTIONS (Patient taking differently: Take 150 mg by mouth every 30 (thirty) days. On the 18th of each month) 3 tablet 1   sitaGLIPtin (JANUVIA) 100 MG tablet Take 100 mg by mouth daily.     No current facility-administered medications for this visit.    (Not in a hospital admission)   Family History  Problem Relation Age of Onset   Diabetes Mother    Hypertension Mother    Heart disease Mother    Stroke Mother    Breast cancer Mother        Age 6   Lung cancer Father    Breast cancer Maternal Aunt        Age 95's   Diabetes Son    Heart attack Son      Review of Systems:   ROS {Ros - complete:30496}     Cardiac Review of Systems: Y or  [    ]= no  Chest Pain [    ]  Resting SOB [   ] Exertional SOB  [  ]  Orthopnea [  ]   Pedal Edema [   ]    Palpitations [  ] Syncope  [  ]   Presyncope [   ]  General Review of Systems: [Y] = yes [  ]=no Constitional: recent weight change [  ]; anorexia [  ]; fatigue [  ]; nausea [  ]; night sweats [  ]; fever [  ]; or chills [  ]                                                               Dental: Last Dentist visit:   Eye : blurred vision [  ]; diplopia [   ]; vision changes [  ];  Amaurosis fugax[  ]; Resp: cough [  ];  wheezing[  ];  hemoptysis[  ];  shortness of breath[  ]; paroxysmal nocturnal dyspnea[  ]; dyspnea on exertion[  ]; or orthopnea[  ];  GI:  gallstones[  ], vomiting[  ];  dysphagia[  ]; melena[  ];   hematochezia [  ]; heartburn[  ];   Hx of  Colonoscopy[  ]; GU: kidney stones [  ]; hematuria[  ];   dysuria [  ];  nocturia[  ];  history of     obstruction [  ]; urinary frequency [  ]             Skin: rash, swelling[  ];, hair loss[  ];  peripheral edema[  ];  or itching[  ]; Musculosketetal: myalgias[  ];  joint swelling[  ];  joint erythema[  ];  joint pain[  ];  back pain[  ];  Heme/Lymph: bruising[  ];  bleeding[  ];  anemia[  ];  Neuro: TIA[  ];  headaches[  ];  stroke[  ];  vertigo[  ];  seizures[  ];   paresthesias[  ];  difficulty walking[  ];  Psych:depression[  ]; anxiety[  ];  Endocrine: diabetes[  ];  thyroid dysfunction[  ];            {Dental Care:21289::"Single injection performed as below:"}            {Flue/Pneumonia Vaccination Status:21291}      Physical Exam: BP (!) 134/92 (BP Location: Right Arm, Patient Position: Sitting, Cuff Size: Normal)   Pulse (!) 123   Resp 20   Ht '5\' 3"'$  (1.6 m)   Wt 151 lb (68.5 kg)   LMP  (LMP Unknown)   SpO2 96% Comment: RA  BMI 26.75 kg/m    {physical exam:21449}  Diagnostic Studies & Laboratory data:     Recent Radiology Findings:   No results found.   I have independently reviewed the above radiologic studies and discussed with the patient   Recent Lab Findings: Lab Results  Component Value Date   WBC 4.4 08/26/2022   HGB 12.7 08/26/2022   HCT 38.1 08/26/2022   PLT 227 08/26/2022   GLUCOSE 218 (H) 08/26/2022   NA 143 08/26/2022   K 4.4 08/26/2022   CL 104 08/26/2022   CREATININE 1.23 (H) 08/26/2022   BUN 19 08/26/2022   CO2 19 (L) 08/26/2022   TSH 1.966 06/22/2013      Assessment / Plan:      We discussed both the coronary and valvular issues at length. All questions were asked and answered.   Plan is 2v CABG (LIMA to LAD, SVG to OM), possible AVR on Monday October 16th. Patient plans to call back and confirm.   Of note, chart lists an allergy to PCN. Will need to review preoperative antibiotics.     I  spent 40 minutes counseling the patient face to face.   Justice Rocher MD 09/11/2022 3:58 PM

## 2022-09-13 ENCOUNTER — Other Ambulatory Visit: Payer: Self-pay | Admitting: *Deleted

## 2022-09-13 DIAGNOSIS — I350 Nonrheumatic aortic (valve) stenosis: Secondary | ICD-10-CM

## 2022-09-13 DIAGNOSIS — I25119 Atherosclerotic heart disease of native coronary artery with unspecified angina pectoris: Secondary | ICD-10-CM

## 2022-09-15 ENCOUNTER — Encounter (HOSPITAL_COMMUNITY): Payer: Self-pay

## 2022-09-15 NOTE — Pre-Procedure Instructions (Signed)
Surgical Instructions    Your procedure is scheduled on Monday, October 16.  Report to [facility] Main Entrance "A" at 5:30 A.M., then check in with the Admitting office.  Call this number if you have problems the morning of surgery:  [phone]   If you have any questions prior to your surgery date call [phone]: Open Monday-Friday 8am-4pm If you experience any cold or flu symptoms such as cough, fever, chills, shortness of breath, etc. between now and your scheduled surgery, please notify us at the above number     Remember:  Do not eat or drink after midnight the night before your surgery   Take these medicines the morning of surgery with A SIP OF WATER:  atorvastatin (LIPITOR) ALPRAZolam (XANAX) if needed   As of today, STOP taking any (unless otherwise instructed by your surgeon) Aleve, Naproxen, Ibuprofen, Motrin, Advil, Goody's, BC's, all herbal medications, fish oil, and all vitamins.  DO NOT take Aspirin on the day of surgery.   WHAT DO I DO ABOUT MY DIABETES MEDICATION?   Do not take oral diabetes medicines (pills) the morning of surgery. DO NOT take Metformin (Glucophage) or sitagliptin (Januvia) the day of surgery.  DO NOT take empagliflozin (JARDIANCE) for 72 hours prior to surgery. Last Dose 09/16/22.     HOW TO MANAGE YOUR DIABETES BEFORE AND AFTER SURGERY  Why is it important to control my blood sugar before and after surgery? Improving blood sugar levels before and after surgery helps healing and can limit problems. A way of improving blood sugar control is eating a healthy diet by:  Eating less sugar and carbohydrates  Increasing activity/exercise  Talking with your doctor about reaching your blood sugar goals High blood sugars (greater than 180 mg/dL) can raise your risk of infections and slow your recovery, so you will need to focus on controlling your diabetes during the weeks before surgery. Make sure that the doctor who takes care of your  diabetes knows about your planned surgery including the date and location.  How do I manage my blood sugar before surgery? Check your blood sugar at least 4 times a day, starting 2 days before surgery, to make sure that the level is not too high or low.  Check your blood sugar the morning of your surgery when you wake up and every 2 hours until you get to the Short Stay unit.  If your blood sugar is less than 70 mg/dL, you will need to treat for low blood sugar: Do not take insulin. Treat a low blood sugar (less than 70 mg/dL) with  cup of clear juice (cranberry or apple), 4 glucose tablets, OR glucose gel. Recheck blood sugar in 15 minutes after treatment (to make sure it is greater than 70 mg/dL). If your blood sugar is not greater than 70 mg/dL on recheck, call [phone] for further instructions. Report your blood sugar to the short stay nurse when you get to Short Stay.  If you are admitted to the hospital after surgery: Your blood sugar will be checked by the staff and you will probably be given insulin after surgery (instead of oral diabetes medicines) to make sure you have good blood sugar levels. The goal for blood sugar control after surgery is 80-180 mg/dL.    [facility] is not responsible for any belongings or valuables.    Do NOT Smoke (Tobacco/Vaping)  24 hours prior to your procedure  If you use a CPAP at night, you may bring your mask  for your overnight stay.   Contacts, glasses, hearing aids, dentures or partials may not be worn into surgery, please bring cases for these belongings   For patients admitted to the hospital, discharge time will be determined by your treatment team.   Patients discharged the day of surgery will not be allowed to drive home, and someone needs to stay with them for 24 hours.   SURGICAL WAITING ROOM VISITATION Patients having surgery or a procedure may have no more than 2 support people in the waiting area - these visitors may rotate.    Children under the age of 43 must have an adult with them who is not the patient. If the patient needs to stay at the hospital during part of their recovery, the visitor guidelines for inpatient rooms apply. Pre-op nurse will coordinate an appropriate time for 1 support person to accompany patient in pre-op.  This support person may not rotate.   Please refer to the [facility] website for the visitor guidelines for Inpatients (after your surgery is over and you are in a regular room).    Special instructions:    Oral Hygiene is also important to reduce your risk of infection.  Remember - BRUSH YOUR TEETH THE MORNING OF SURGERY WITH YOUR REGULAR TOOTHPASTE   [facility]- Preparing For Surgery  Before surgery, you can play an important role. Because skin is not sterile, your skin needs to be as free of germs as possible. You can reduce the number of germs on your skin by washing with CHG (chlorahexidine gluconate) Soap before surgery.  CHG is an antiseptic cleaner which kills germs and bonds with the skin to continue killing germs even after washing.     Please do not use if you have an allergy to CHG or antibacterial soaps. If your skin becomes reddened/irritated stop using the CHG.  Do not shave (including legs and underarms) for at least 48 hours prior to first CHG shower. It is OK to shave your face.  Please follow these instructions carefully.     Shower the NIGHT BEFORE SURGERY and the MORNING OF SURGERY with CHG Soap.   If you chose to wash your hair, wash your hair first as usual with your normal shampoo. After you shampoo, rinse your hair and body thoroughly to remove the shampoo.  Then [employer] and genitals (private parts) with your normal soap and rinse thoroughly to remove soap.  After that Use CHG Soap as you would any other liquid soap. You can apply CHG directly to the skin and wash gently with a scrungie or a clean washcloth.   Apply the CHG Soap to your body ONLY FROM  THE NECK DOWN.  Do not use on open wounds or open sores. Avoid contact with your eyes, ears, mouth and genitals (private parts). Wash Face and genitals (private parts)  with your normal soap.   Wash thoroughly, paying special attention to the area where your surgery will be performed.  Thoroughly rinse your body with warm water from the neck down.  DO NOT shower/wash with your normal soap after using and rinsing off the CHG Soap.  Pat yourself dry with a CLEAN TOWEL.  Wear CLEAN PAJAMAS to bed the night before surgery  Place CLEAN SHEETS on your bed the night before your surgery  DO NOT SLEEP WITH PETS.   Day of Surgery:  Take a shower with CHG soap. Wear Clean/Comfortable clothing the morning of surgery  Do not wear jewelry or makeup.  Do not wear lotions, powders, perfumes/cologne or deodorant. Do not shave 48 hours prior to surgery.  Men may shave face and neck. Do not bring valuables to the hospital. Do not wear nail polish, gel polish, artificial nails, or any other type of covering on natural nails (fingers and toes) If you have artificial nails or gel coating that need to be removed by a nail salon, please have this removed prior to surgery. Artificial nails or gel coating may interfere with anesthesia's ability to adequately monitor your vital signs. Remember to brush your teeth WITH YOUR REGULAR TOOTHPASTE.    If you received a COVID test during your pre-op visit, it is requested that you wear a mask when out in public, stay away from anyone that may not be feeling well, and notify your surgeon if you develop symptoms. If you have been in contact with anyone that has tested positive in the last 10 days, please notify your surgeon.    Please read over the following fact sheets that you were given.

## 2022-09-15 NOTE — Progress Notes (Signed)
PCP - Dr Maury Dus Cardiologist - Dr Oswaldo Milian  Chest x-ray - 09/16/22 EKG - 08/26/22 Stress Test - 08/24/22 ECHO - 04/06/22  03/30/22 Cardiac Cath - 09/01/22  ICD Pacemaker/Loop - n/a  Sleep Study -  n/a CPAP - none  Do not take Metformin and Januvia on the morning of surgery.  Do not take Jardiance for 72 hours prior to surgery.  Last dose will be on 09/16/22.  If your blood sugar is less than 70 mg/dL, you will need to treat for low blood sugar: Treat a low blood sugar (less than 70 mg/dL) with  cup of clear juice (cranberry or apple), 4 glucose tablets, OR glucose gel. Recheck blood sugar in 15 minutes after treatment (to make sure it is greater than 70 mg/dL). If your blood sugar is not greater than 70 mg/dL on recheck, call [phone] for further instructions.  Aspirin Instructions: Follow your surgeon's instructions on when to stop aspirin prior to surgery,  If no instructions were given by your surgeon then you will need to call the office for those instructions.  Anesthesia review: Yes  STOP now taking any Aspirin (unless otherwise instructed by your surgeon), Aleve, Naproxen, Ibuprofen, Motrin, Advil, Goody's, BC's, all herbal medications, fish oil, and all vitamins.   Coronavirus Screening Do you have any of the following symptoms:  Cough yes/no: No Fever (>100.79F)  yes/no: No Runny nose yes/no: No Sore throat yes/no: No Difficulty breathing/shortness of breath  yes/no: No  Have you traveled in the last 14 days and where? yes/no: No  Patient verbalized understanding of instructions that were given to them at the PAT appointment. Patient was also instructed that they will need to review over the PAT instructions again at home before surgery.

## 2022-09-16 ENCOUNTER — Encounter (HOSPITAL_COMMUNITY): Payer: Self-pay

## 2022-09-16 ENCOUNTER — Other Ambulatory Visit: Payer: Self-pay

## 2022-09-16 ENCOUNTER — Ambulatory Visit (HOSPITAL_COMMUNITY)
Admission: RE | Admit: 2022-09-16 | Discharge: 2022-09-16 | Disposition: A | Discharge status: Home / Self Care | Payer: Medicare Other | Source: Ambulatory Visit | Attending: Cardiothoracic Surgery | Admitting: Cardiothoracic Surgery

## 2022-09-16 ENCOUNTER — Encounter (HOSPITAL_COMMUNITY)
Admission: RE | Admit: 2022-09-16 | Discharge: 2022-09-16 | Disposition: A | Discharge status: Home / Self Care | Payer: Medicare Other | Source: Ambulatory Visit | Attending: Cardiothoracic Surgery | Admitting: Cardiothoracic Surgery

## 2022-09-16 VITALS — BP 127/73 | HR 88 | Temp 97.9°F | Resp 17 | Ht 63.0 in | Wt 153.1 lb

## 2022-09-16 DIAGNOSIS — I10 Essential (primary) hypertension: Secondary | ICD-10-CM | POA: Insufficient documentation

## 2022-09-16 DIAGNOSIS — Z01818 Encounter for other preprocedural examination: Secondary | ICD-10-CM

## 2022-09-16 DIAGNOSIS — E785 Hyperlipidemia, unspecified: Secondary | ICD-10-CM | POA: Insufficient documentation

## 2022-09-16 DIAGNOSIS — Z1152 Encounter for screening for COVID-19: Secondary | ICD-10-CM | POA: Diagnosis not present

## 2022-09-16 DIAGNOSIS — I350 Nonrheumatic aortic (valve) stenosis: Secondary | ICD-10-CM | POA: Insufficient documentation

## 2022-09-16 DIAGNOSIS — I25119 Atherosclerotic heart disease of native coronary artery with unspecified angina pectoris: Secondary | ICD-10-CM | POA: Diagnosis not present

## 2022-09-16 DIAGNOSIS — Z951 Presence of aortocoronary bypass graft: Secondary | ICD-10-CM | POA: Insufficient documentation

## 2022-09-16 DIAGNOSIS — E119 Type 2 diabetes mellitus without complications: Secondary | ICD-10-CM | POA: Insufficient documentation

## 2022-09-16 HISTORY — DX: Anxiety disorder, unspecified: F41.9

## 2022-09-16 LAB — PROTIME-INR
INR: 1 (ref 0.8–1.2)
Prothrombin Time: 12.9 seconds (ref 11.4–15.2)

## 2022-09-16 LAB — CBC
HCT: 40.4 % (ref 36.0–46.0)
Hemoglobin: 13.4 g/dL (ref 12.0–15.0)
MCH: 31.6 pg (ref 26.0–34.0)
MCHC: 33.2 g/dL (ref 30.0–36.0)
MCV: 95.3 fL (ref 80.0–100.0)
Platelets: 194 10*3/uL (ref 150–400)
RBC: 4.24 MIL/uL (ref 3.87–5.11)
RDW: 12.7 % (ref 11.5–15.5)
WBC: 3.9 10*3/uL — ABNORMAL LOW (ref 4.0–10.5)
nRBC: 0 % (ref 0.0–0.2)

## 2022-09-16 LAB — BLOOD GAS, ARTERIAL
Acid-base deficit: 4.4 mmol/L — ABNORMAL HIGH (ref 0.0–2.0)
Bicarbonate: 18.8 mmol/L — ABNORMAL LOW (ref 20.0–28.0)
Drawn by: 6643
O2 Saturation: 99 %
Patient temperature: 37
pCO2 arterial: 29 mmHg — ABNORMAL LOW (ref 32–48)
pH, Arterial: 7.42 (ref 7.35–7.45)
pO2, Arterial: 151 mmHg — ABNORMAL HIGH (ref 83–108)

## 2022-09-16 LAB — COMPREHENSIVE METABOLIC PANEL
ALT: 25 U/L (ref 0–44)
AST: 32 U/L (ref 15–41)
Albumin: 3.9 g/dL (ref 3.5–5.0)
Alkaline Phosphatase: 43 U/L (ref 38–126)
Anion gap: 12 (ref 5–15)
BUN: 18 mg/dL (ref 8–23)
CO2: 17 mmol/L — ABNORMAL LOW (ref 22–32)
Calcium: 9.6 mg/dL (ref 8.9–10.3)
Chloride: 112 mmol/L — ABNORMAL HIGH (ref 98–111)
Creatinine, Ser: 0.72 mg/dL (ref 0.44–1.00)
GFR, Estimated: >60 mL/min (ref >60)
Glucose, Bld: 131 mg/dL — ABNORMAL HIGH (ref 70–99)
Potassium: 4.1 mmol/L (ref 3.5–5.1)
Sodium: 141 mmol/L (ref 135–145)
Total Bilirubin: 0.9 mg/dL (ref 0.3–1.2)
Total Protein: 7.1 g/dL (ref 6.5–8.1)

## 2022-09-16 LAB — URINALYSIS, ROUTINE W REFLEX MICROSCOPIC
Bacteria, UA: NONE SEEN
Bilirubin Urine: NEGATIVE
Glucose, UA: >=500 mg/dL — AB
Hgb urine dipstick: NEGATIVE
Ketones, ur: NEGATIVE mg/dL
Leukocytes,Ua: NEGATIVE
Nitrite: NEGATIVE
Protein, ur: NEGATIVE mg/dL
Specific Gravity, Urine: 1.035 — ABNORMAL HIGH (ref 1.005–1.030)
pH: 5 (ref 5.0–8.0)

## 2022-09-16 LAB — SURGICAL PCR SCREEN
MRSA, PCR: NEGATIVE
Staphylococcus aureus: NEGATIVE

## 2022-09-16 LAB — SARS CORONAVIRUS 2 BY RT PCR: SARS Coronavirus 2 by RT PCR: NEGATIVE

## 2022-09-16 LAB — HEMOGLOBIN A1C
Hgb A1c MFr Bld: 7 % — ABNORMAL HIGH (ref 4.8–5.6)
Mean Plasma Glucose: 154.2 mg/dL

## 2022-09-16 LAB — APTT: aPTT: 22 seconds — ABNORMAL LOW (ref 24–36)

## 2022-09-16 LAB — GLUCOSE, CAPILLARY: Glucose-Capillary: 209 mg/dL — ABNORMAL HIGH (ref 70–99)

## 2022-09-16 NOTE — Progress Notes (Signed)
VASCULAR LAB    Pre CABG Dopplers have been performed.  See CV proc for preliminary results.   Yousof Alderman, RVT 09/16/2022, 11:03 AM

## 2022-09-17 MED ORDER — TRANEXAMIC ACID (OHS) BOLUS VIA INFUSION
15.0000 mg/kg | INTRAVENOUS | Status: AC
  Administered 2022-09-20: 1041 mg via INTRAVENOUS
  Filled 2022-09-17: qty 1041

## 2022-09-17 MED ORDER — TRANEXAMIC ACID 1000 MG/10ML IV SOLN
1.5000 mg/kg/h | INTRAVENOUS | Status: AC
  Administered 2022-09-20: 1.5 mg/kg/h via INTRAVENOUS
  Filled 2022-09-17: qty 25

## 2022-09-17 MED ORDER — VANCOMYCIN HCL 1250 MG/250ML IV SOLN
1250.0000 mg | INTRAVENOUS | Status: AC
  Administered 2022-09-20: 1250 mg via INTRAVENOUS
  Filled 2022-09-17 (×2): qty 250

## 2022-09-17 MED ORDER — PLASMA-LYTE A IV SOLN
INTRAVENOUS | Status: AC
  Administered 2022-09-20: 500 mL
  Filled 2022-09-17 (×2): qty 2.5

## 2022-09-17 MED ORDER — LEVOFLOXACIN IN D5W 500 MG/100ML IV SOLN
500.0000 mg | INTRAVENOUS | Status: AC
  Administered 2022-09-20: 500 mg via INTRAVENOUS
  Filled 2022-09-17 (×2): qty 100

## 2022-09-17 MED ORDER — EPINEPHRINE HCL 5 MG/250ML IV SOLN IN NS
0.0000 ug/min | INTRAVENOUS | Status: AC
  Administered 2022-09-20: 2 ug/min via INTRAVENOUS
  Filled 2022-09-17: qty 250

## 2022-09-17 MED ORDER — MILRINONE LACTATE IN DEXTROSE 20-5 MG/100ML-% IV SOLN
0.3000 ug/kg/min | INTRAVENOUS | Status: DC
  Filled 2022-09-17: qty 100

## 2022-09-17 MED ORDER — TRANEXAMIC ACID (OHS) PUMP PRIME SOLUTION
2.0000 mg/kg | INTRAVENOUS | Status: DC
  Filled 2022-09-17: qty 1.39

## 2022-09-17 MED ORDER — NOREPINEPHRINE 4 MG/250ML-% IV SOLN
0.0000 ug/min | INTRAVENOUS | Status: AC
  Administered 2022-09-20: 2 ug/min via INTRAVENOUS
  Filled 2022-09-17: qty 250

## 2022-09-17 MED ORDER — INSULIN REGULAR(HUMAN) IN NACL 100-0.9 UT/100ML-% IV SOLN
INTRAVENOUS | Status: AC
  Administered 2022-09-20: 6.5 [IU]/h via INTRAVENOUS
  Filled 2022-09-17: qty 100

## 2022-09-17 MED ORDER — HEPARIN 30,000 UNITS/1000 ML (OHS) CELLSAVER SOLUTION
Status: DC
  Filled 2022-09-17: qty 1000

## 2022-09-17 MED ORDER — DEXMEDETOMIDINE HCL IN NACL 400 MCG/100ML IV SOLN
0.1000 ug/kg/h | INTRAVENOUS | Status: AC
  Administered 2022-09-20: .5 ug/kg/h via INTRAVENOUS
  Filled 2022-09-17: qty 100

## 2022-09-17 MED ORDER — MANNITOL 20 % IV SOLN
INTRAVENOUS | Status: DC
  Filled 2022-09-17: qty 13

## 2022-09-17 MED ORDER — POTASSIUM CHLORIDE 2 MEQ/ML IV SOLN
80.0000 meq | INTRAVENOUS | Status: DC
  Filled 2022-09-17: qty 40

## 2022-09-17 MED ORDER — PHENYLEPHRINE HCL-NACL 20-0.9 MG/250ML-% IV SOLN
30.0000 ug/min | INTRAVENOUS | Status: AC
  Administered 2022-09-20: 20 ug/min via INTRAVENOUS
  Filled 2022-09-17: qty 250

## 2022-09-19 NOTE — H&P (Signed)
[address]       [place] [address]             [phone]                                        T[facility] B [facility] Medical Record #[id number] Date of Birth: 03-03-1946   Referring: Belva Crome, MD Primary Care: Maury Dus, MD Primary Cardiologist: Donato Heinz, MD   Chief Complaint:        Chief Complaint  Patient presents with   Coronary Artery Disease      New patient consultation, CATH 9/27, ECHO 5/2      History of Present Illness:     Sheila Keller is a 76 y.o. female with a hx of aortic stenosis, hypertension, diabetes, hyperlipidemia who presents for CT surgical evaluation of left main coronary disease and aortic stenosis.  She has been followed for AS for years and recently was diagnosed with severe left main disease. Further history below:   She was referred to Dr. Gardiner Rhyme  for evaluation of heart murmur.  At that time, she denied any chest pain, dyspnea, palpitations, LE edema, lightheadeness, or syncope.  For exercise she walks around yard for 20 minutes.  She denied any exertional symptoms.  Never smoked.  Mother had MI in 39s.   TTE on 03/05/2020 showed hyperdynamic LV systolic function, mild concentric LVH, grade 1 diastolic dysfunction, normal RV function, normal PASP, mild to moderate aortic stenosis.  Echocardiogram 03/17/2021 showed LVEF 70 to 95%, grade 1 diastolic dysfunction, normal RV function, moderate aortic stenosis.  Echocardiogram 03/30/2022 showed EF 70 to 28%, grade 1 diastolic dysfunction, LV mid cavitary gradient 22 mmHg (43 mmHg with Valsalva), normal RV function, moderate aortic stenosis (V-max 3 m/s, mean gradient 24 mmHg, AVA 0.8 cm, DI 0.34).   She underwent a CT calcium scoring on 05/25/22 for a total score of 1176. This was 95 percentile for age-, race-, and sex-matched controls.   She then underwent that was consistent with ischemia on 08/24/22, leading to a LHC on 9/27 which demonstrated  severe LM disease. LAD with another 30% lesion and normal CX and RCA. LV hyperdynamic with normal LVEDP.      Current Activity/ Functional Status: Walks around her home unassisted but when asked about 2 blocks she said unsure if she could walk that distance, does not attempt.        Past Medical History:  Diagnosis Date   Breast lump 08/2017    no cancer/ [facility]   Chronic cystitis     Diabetes mellitus     Elevated cholesterol     Heart murmur      Aortic valve stenosis.  Sees cardiologist with [facility]   Hypertension     Mild dysplasia of cervix (CIN I)     Osteopenia 02/2018    T score -1.8 FRAX 11% / 2.2%           Past Surgical History:  Procedure Laterality Date   BREAST BIOPSY Left     CATARACT EXTRACTION Bilateral      x2    CERVICAL BIOPSY  W/ LOOP ELECTRODE EXCISION   2009   CHOLECYSTECTOMY       COLPOSCOPY       DILATION AND CURETTAGE OF UTERUS  LEFT HEART CATH AND CORONARY ANGIOGRAPHY N/A 09/01/2022    Procedure: LEFT HEART CATH AND CORONARY ANGIOGRAPHY;  Surgeon: Belva Crome, MD;  Location: [facility];  Service: Cardiovascular;  Laterality: N/A;   TUBAL LIGATION       vocal cord surgery        nodules excised      Social History       Tobacco Use  Smoking Status Never  Smokeless Tobacco Never    Social History        Substance and Sexual Activity  Alcohol Use No   Alcohol/week: 0.0 standard drinks of alcohol             Allergies  Allergen Reactions   Bee Venom Hives   Farxiga [Dapagliflozin] Other (See Comments)      Pt is unaware of allergy or reaction    Penicillins        Has patient had a PCN reaction causing immediate rash, facial/tongue/throat swelling, SOB or lightheadedness with hypotension: Yes Has patient had a PCN reaction causing severe rash involving mucus membranes or skin necrosis: Yes Has patient had a PCN reaction that required hospitalization No Has patient had a PCN reaction occurring  within the last 10 years: No If all of the above answers are "NO", then may proceed with Cephalosporin use.              Current Outpatient Medications  Medication Sig Dispense Refill   ALPRAZolam (XANAX) 0.5 MG tablet Take 0.5 mg by mouth 2 (two) times daily as needed for anxiety.   1   aspirin EC 81 MG tablet Chew 81 mg by mouth every morning.       atorvastatin (LIPITOR) 80 MG tablet Take 1 tablet (80 mg total) by mouth daily. 90 tablet 3   Cholecalciferol (VITAMIN D3) 2000 units TABS Take 2,000 Units by mouth every morning.       empagliflozin (JARDIANCE) 25 MG TABS tablet Take 25 mg by mouth daily.       glucose blood (ONETOUCH VERIO) test strip for use when checking blood sugars       losartan (COZAAR) 100 MG tablet Take 1 tablet (100 mg total) by mouth daily. 90 tablet 3   metFORMIN (GLUCOPHAGE) 500 MG tablet Take 1,000 mg by mouth 2 (two) times daily with a meal.   1   risedronate (ACTONEL) 150 MG tablet PLEASE SEE ATTACHED FOR DETAILED DIRECTIONS (Patient taking differently: Take 150 mg by mouth every 30 (thirty) days. On the 18th of each month) 3 tablet 1   sitaGLIPtin (JANUVIA) 100 MG tablet Take 100 mg by mouth daily.        No current facility-administered medications for this visit.      (Not in a hospital admission)          Family History  Problem Relation Age of Onset   Diabetes Mother     Hypertension Mother     Heart disease Mother     Stroke Mother     Breast cancer Mother          Age 10   Lung cancer Father     Breast cancer Maternal Aunt          Age 76's   Diabetes Son     Heart attack Son          Review of Systems:    ROS 14 point ROS reviewed and negative except as per above.  Physical Exam: BP (!) 134/92 (BP Location: Right Arm, Patient Position: Sitting, Cuff Size: Normal)   Pulse (!) 123   Resp 20   Ht '5\' 3"'$  (1.6 m)   Wt 151 lb (68.5 kg)   LMP  (LMP Unknown)   SpO2 96% Comment: RA  BMI 26.75 kg/m      NAD neuro grossly  nonfocal Elegantly dressed RRR, systolic murmu Nonlaboured resp Abd soft nd Extr WWP   Diagnostic Studies & Laboratory data:     Recent Radiology Findings:    Imaging Results (Last 48 hours)  No results found.     I have independently reviewed the above radiologic studies and discussed with the patient    Recent Lab Findings: Recent Labs       Lab Results  Component Value Date    WBC 4.4 08/26/2022    HGB 12.7 08/26/2022    HCT 38.1 08/26/2022    PLT 227 08/26/2022    GLUCOSE 218 (H) 08/26/2022    NA 143 08/26/2022    K 4.4 08/26/2022    CL 104 08/26/2022    CREATININE 1.23 (H) 08/26/2022    BUN 19 08/26/2022    CO2 19 (L) 08/26/2022    TSH 1.966 06/22/2013      Cardiac Cath 09/01/22:   CONCLUSIONS: Globular calcified atherosclerotic plaque obstructing the distal left main up to 80% range.  PET scan demonstrated ischemia in the circumflex territory. Normal hyperemic LAD flow. LAD is calcified and contains 30% mid vessel narrowing Circumflex is normal RCA is normal Normal LVEDP.  Left ventricle is hyperdynamic by recent echo.   TTE 03/30/22:   IMPRESSIONS   1. Left ventricular ejection fraction, by estimation, is 70 to 75%. The  left ventricle has hyperdynamic function. The left ventricle has no  regional wall motion abnormalities. There is mild left ventricular  hypertrophy. Left ventricular diastolic  parameters are consistent with Grade I diastolic dysfunction (impaired  relaxation). Midcavitary gradient measures 49mHg, 410mg with Valsalva   2. Right ventricular systolic function is normal. The right ventricular  size is normal. Tricuspid regurgitation signal is inadequate for assessing  PA pressure.   3. The mitral valve is normal in structure. No evidence of mitral valve  regurgitation.   4. The aortic valve is calcified. There is moderate calcification of the  aortic valve. Aortic valve regurgitation is not visualized. Moderate  aortic valve stenosis.  Vmax 3.0 m/s, MG 24 mmHg, AVA 0.8 cm^2, DI 0.34   5. The inferior vena cava is normal in size with greater than 50%  respiratory variability, suggesting right atrial pressure of 3 mmHg.      Assessment / Plan:   PeEMMALINA ESPERICUETAs a [age].o. female with a hx of aortic stenosis, hypertension, diabetes, hyperlipidemia who presents for evaluation of left main coronary disease and moderate aortic stenosis.    We discussed both the coronary and valvular issues at length. All questions were asked and answered.     Plan is 2v CABG (LIMA to LAD, SVG to OM), possible AVR on Monday October 16th. Patient plans to call back and confirm.  At this point, I would elect for CAB alone as she would be a candidate for TAVR later and likely achieve a better hemodynamic gradient with transcatheter therapy. If she were <65-70 yo, I would generally enlarge the root and place a large surgical valve instead. Risks/benefits/alternatives were discussed at length including 1-2% mortality, 4% morbidity (any organ), 95% chance of standard recovery.  I see patients back in the office at 1 week and 1 month and she should expect a full recovery within 2 months.    Of note, allergy to PCN. Will need to review preoperative antibiotics.    I  spent 40 minutes counseling the patient face to face.     Justice Rocher MD 09/11/2022 3:58 PM

## 2022-09-20 ENCOUNTER — Inpatient Hospital Stay (HOSPITAL_COMMUNITY)
Admission: RE | Disposition: A | Discharge status: Skilled Nursing Facility | Payer: Self-pay | Source: Home / Self Care | Attending: Cardiothoracic Surgery

## 2022-09-20 ENCOUNTER — Inpatient Hospital Stay (HOSPITAL_COMMUNITY): Payer: Medicare Other | Admitting: Anesthesiology

## 2022-09-20 ENCOUNTER — Encounter (HOSPITAL_COMMUNITY): Payer: Self-pay | Admitting: Cardiothoracic Surgery

## 2022-09-20 ENCOUNTER — Other Ambulatory Visit: Payer: Self-pay

## 2022-09-20 ENCOUNTER — Inpatient Hospital Stay (HOSPITAL_COMMUNITY)
Admission: RE | Admit: 2022-09-20 | Discharge: 2022-09-27 | DRG: 236 | Disposition: A | Discharge status: Skilled Nursing Facility | Payer: Medicare Other | Attending: Cardiothoracic Surgery | Admitting: Cardiothoracic Surgery

## 2022-09-20 ENCOUNTER — Inpatient Hospital Stay (HOSPITAL_COMMUNITY): Payer: Medicare Other

## 2022-09-20 DIAGNOSIS — M6281 Muscle weakness (generalized): Secondary | ICD-10-CM | POA: Diagnosis not present

## 2022-09-20 DIAGNOSIS — E7800 Pure hypercholesterolemia, unspecified: Secondary | ICD-10-CM | POA: Diagnosis not present

## 2022-09-20 DIAGNOSIS — Z833 Family history of diabetes mellitus: Secondary | ICD-10-CM

## 2022-09-20 DIAGNOSIS — E119 Type 2 diabetes mellitus without complications: Secondary | ICD-10-CM | POA: Diagnosis present

## 2022-09-20 DIAGNOSIS — R2681 Unsteadiness on feet: Secondary | ICD-10-CM | POA: Diagnosis not present

## 2022-09-20 DIAGNOSIS — Z951 Presence of aortocoronary bypass graft: Principal | ICD-10-CM

## 2022-09-20 DIAGNOSIS — Z9049 Acquired absence of other specified parts of digestive tract: Secondary | ICD-10-CM

## 2022-09-20 DIAGNOSIS — I25119 Atherosclerotic heart disease of native coronary artery with unspecified angina pectoris: Secondary | ICD-10-CM

## 2022-09-20 DIAGNOSIS — Z4682 Encounter for fitting and adjustment of non-vascular catheter: Secondary | ICD-10-CM | POA: Diagnosis not present

## 2022-09-20 DIAGNOSIS — Z881 Allergy status to other antibiotic agents status: Secondary | ICD-10-CM | POA: Diagnosis not present

## 2022-09-20 DIAGNOSIS — R918 Other nonspecific abnormal finding of lung field: Secondary | ICD-10-CM | POA: Diagnosis not present

## 2022-09-20 DIAGNOSIS — Z7982 Long term (current) use of aspirin: Secondary | ICD-10-CM

## 2022-09-20 DIAGNOSIS — I471 Supraventricular tachycardia, unspecified: Secondary | ICD-10-CM | POA: Diagnosis not present

## 2022-09-20 DIAGNOSIS — I10 Essential (primary) hypertension: Secondary | ICD-10-CM | POA: Diagnosis not present

## 2022-09-20 DIAGNOSIS — I517 Cardiomegaly: Secondary | ICD-10-CM | POA: Diagnosis not present

## 2022-09-20 DIAGNOSIS — D62 Acute posthemorrhagic anemia: Secondary | ICD-10-CM | POA: Diagnosis not present

## 2022-09-20 DIAGNOSIS — J9811 Atelectasis: Secondary | ICD-10-CM | POA: Diagnosis not present

## 2022-09-20 DIAGNOSIS — I350 Nonrheumatic aortic (valve) stenosis: Secondary | ICD-10-CM | POA: Diagnosis present

## 2022-09-20 DIAGNOSIS — Z880 Allergy status to penicillin: Secondary | ICD-10-CM | POA: Diagnosis not present

## 2022-09-20 DIAGNOSIS — Z87410 Personal history of cervical dysplasia: Secondary | ICD-10-CM

## 2022-09-20 DIAGNOSIS — Z8249 Family history of ischemic heart disease and other diseases of the circulatory system: Secondary | ICD-10-CM | POA: Diagnosis not present

## 2022-09-20 DIAGNOSIS — N3020 Other chronic cystitis without hematuria: Secondary | ICD-10-CM | POA: Diagnosis not present

## 2022-09-20 DIAGNOSIS — Z91030 Bee allergy status: Secondary | ICD-10-CM | POA: Diagnosis not present

## 2022-09-20 DIAGNOSIS — I2510 Atherosclerotic heart disease of native coronary artery without angina pectoris: Secondary | ICD-10-CM

## 2022-09-20 DIAGNOSIS — Z1152 Encounter for screening for COVID-19: Secondary | ICD-10-CM

## 2022-09-20 DIAGNOSIS — Z7984 Long term (current) use of oral hypoglycemic drugs: Secondary | ICD-10-CM

## 2022-09-20 DIAGNOSIS — Z79899 Other long term (current) drug therapy: Secondary | ICD-10-CM

## 2022-09-20 DIAGNOSIS — D6959 Other secondary thrombocytopenia: Secondary | ICD-10-CM | POA: Diagnosis present

## 2022-09-20 HISTORY — PX: TEE WITHOUT CARDIOVERSION: SHX5443

## 2022-09-20 HISTORY — PX: CORONARY ARTERY BYPASS GRAFT: SHX141

## 2022-09-20 LAB — POCT I-STAT 7, (LYTES, BLD GAS, ICA,H+H)
Acid-Base Excess: 0 mmol/L (ref 0.0–2.0)
Acid-base deficit: 6 mmol/L — ABNORMAL HIGH (ref 0.0–2.0)
Acid-base deficit: 2 mmol/L (ref 0.0–2.0)
Acid-base deficit: 3 mmol/L — ABNORMAL HIGH (ref 0.0–2.0)
Acid-base deficit: 4 mmol/L — ABNORMAL HIGH (ref 0.0–2.0)
Acid-base deficit: 4 mmol/L — ABNORMAL HIGH (ref 0.0–2.0)
Acid-base deficit: 5 mmol/L — ABNORMAL HIGH (ref 0.0–2.0)
Bicarbonate: 20.1 mmol/L (ref 20.0–28.0)
Bicarbonate: 22.5 mmol/L (ref 20.0–28.0)
Bicarbonate: 23.8 mmol/L (ref 20.0–28.0)
Bicarbonate: 21.1 mmol/L (ref 20.0–28.0)
Bicarbonate: 20.5 mmol/L (ref 20.0–28.0)
Bicarbonate: 20.2 mmol/L (ref 20.0–28.0)
Bicarbonate: 20.6 mmol/L (ref 20.0–28.0)
Calcium, Ion: 1.22 mmol/L (ref 1.15–1.40)
Calcium, Ion: 0.9 mmol/L — ABNORMAL LOW (ref 1.15–1.40)
Calcium, Ion: 0.95 mmol/L — ABNORMAL LOW (ref 1.15–1.40)
Calcium, Ion: 0.92 mmol/L — ABNORMAL LOW (ref 1.15–1.40)
Calcium, Ion: 0.95 mmol/L — ABNORMAL LOW (ref 1.15–1.40)
Calcium, Ion: 1.02 mmol/L — ABNORMAL LOW (ref 1.15–1.40)
Calcium, Ion: 1.06 mmol/L — ABNORMAL LOW (ref 1.15–1.40)
HCT: 34 % — ABNORMAL LOW (ref 36.0–46.0)
HCT: 19 % — ABNORMAL LOW (ref 36.0–46.0)
HCT: 19 % — ABNORMAL LOW (ref 36.0–46.0)
HCT: 22 % — ABNORMAL LOW (ref 36.0–46.0)
HCT: 25 % — ABNORMAL LOW (ref 36.0–46.0)
HCT: 29 % — ABNORMAL LOW (ref 36.0–46.0)
HCT: 29 % — ABNORMAL LOW (ref 36.0–46.0)
Hemoglobin: 11.6 g/dL — ABNORMAL LOW (ref 12.0–15.0)
Hemoglobin: 6.5 g/dL — CL (ref 12.0–15.0)
Hemoglobin: 6.5 g/dL — CL (ref 12.0–15.0)
Hemoglobin: 7.5 g/dL — ABNORMAL LOW (ref 12.0–15.0)
Hemoglobin: 8.5 g/dL — ABNORMAL LOW (ref 12.0–15.0)
Hemoglobin: 9.9 g/dL — ABNORMAL LOW (ref 12.0–15.0)
Hemoglobin: 9.9 g/dL — ABNORMAL LOW (ref 12.0–15.0)
O2 Saturation: 100 %
O2 Saturation: 100 %
O2 Saturation: 100 %
O2 Saturation: 100 %
O2 Saturation: 100 %
O2 Saturation: 99 %
O2 Saturation: 99 %
Patient temperature: 37.1
Patient temperature: 36.9
Potassium: 3.8 mmol/L (ref 3.5–5.1)
Potassium: 5.5 mmol/L — ABNORMAL HIGH (ref 3.5–5.1)
Potassium: 4.7 mmol/L (ref 3.5–5.1)
Potassium: 4.9 mmol/L (ref 3.5–5.1)
Potassium: 3.6 mmol/L (ref 3.5–5.1)
Potassium: 4.2 mmol/L (ref 3.5–5.1)
Potassium: 4.1 mmol/L (ref 3.5–5.1)
Sodium: 141 mmol/L (ref 135–145)
Sodium: 139 mmol/L (ref 135–145)
Sodium: 140 mmol/L (ref 135–145)
Sodium: 139 mmol/L (ref 135–145)
Sodium: 143 mmol/L (ref 135–145)
Sodium: 143 mmol/L (ref 135–145)
Sodium: 144 mmol/L (ref 135–145)
TCO2: 21 mmol/L — ABNORMAL LOW (ref 22–32)
TCO2: 24 mmol/L (ref 22–32)
TCO2: 25 mmol/L (ref 22–32)
TCO2: 22 mmol/L (ref 22–32)
TCO2: 22 mmol/L (ref 22–32)
TCO2: 21 mmol/L — ABNORMAL LOW (ref 22–32)
TCO2: 22 mmol/L (ref 22–32)
pCO2 arterial: 40.2 mmHg (ref 32–48)
pCO2 arterial: 38.1 mmHg (ref 32–48)
pCO2 arterial: 30.2 mmHg — ABNORMAL LOW (ref 32–48)
pCO2 arterial: 31 mmHg — ABNORMAL LOW (ref 32–48)
pCO2 arterial: 34.1 mmHg (ref 32–48)
pCO2 arterial: 33.4 mmHg (ref 32–48)
pCO2 arterial: 39.8 mmHg (ref 32–48)
pH, Arterial: 7.307 — ABNORMAL LOW (ref 7.35–7.45)
pH, Arterial: 7.378 (ref 7.35–7.45)
pH, Arterial: 7.504 — ABNORMAL HIGH (ref 7.35–7.45)
pH, Arterial: 7.44 (ref 7.35–7.45)
pH, Arterial: 7.386 (ref 7.35–7.45)
pH, Arterial: 7.39 (ref 7.35–7.45)
pH, Arterial: 7.321 — ABNORMAL LOW (ref 7.35–7.45)
pO2, Arterial: 371 mmHg — ABNORMAL HIGH (ref 83–108)
pO2, Arterial: 568 mmHg — ABNORMAL HIGH (ref 83–108)
pO2, Arterial: 396 mmHg — ABNORMAL HIGH (ref 83–108)
pO2, Arterial: 368 mmHg — ABNORMAL HIGH (ref 83–108)
pO2, Arterial: 430 mmHg — ABNORMAL HIGH (ref 83–108)
pO2, Arterial: 131 mmHg — ABNORMAL HIGH (ref 83–108)
pO2, Arterial: 135 mmHg — ABNORMAL HIGH (ref 83–108)

## 2022-09-20 LAB — POCT I-STAT, CHEM 8
BUN: 19 mg/dL (ref 8–23)
BUN: 19 mg/dL (ref 8–23)
BUN: 16 mg/dL (ref 8–23)
BUN: 18 mg/dL (ref 8–23)
BUN: 19 mg/dL (ref 8–23)
BUN: 17 mg/dL (ref 8–23)
Calcium, Ion: 1.19 mmol/L (ref 1.15–1.40)
Calcium, Ion: 1.21 mmol/L (ref 1.15–1.40)
Calcium, Ion: 0.95 mmol/L — ABNORMAL LOW (ref 1.15–1.40)
Calcium, Ion: 0.95 mmol/L — ABNORMAL LOW (ref 1.15–1.40)
Calcium, Ion: 0.96 mmol/L — ABNORMAL LOW (ref 1.15–1.40)
Calcium, Ion: 0.95 mmol/L — ABNORMAL LOW (ref 1.15–1.40)
Chloride: 109 mmol/L (ref 98–111)
Chloride: 108 mmol/L (ref 98–111)
Chloride: 107 mmol/L (ref 98–111)
Chloride: 106 mmol/L (ref 98–111)
Chloride: 107 mmol/L (ref 98–111)
Chloride: 108 mmol/L (ref 98–111)
Creatinine, Ser: 0.4 mg/dL — ABNORMAL LOW (ref 0.44–1.00)
Creatinine, Ser: 0.5 mg/dL (ref 0.44–1.00)
Creatinine, Ser: 0.4 mg/dL — ABNORMAL LOW (ref 0.44–1.00)
Creatinine, Ser: 0.4 mg/dL — ABNORMAL LOW (ref 0.44–1.00)
Creatinine, Ser: 0.4 mg/dL — ABNORMAL LOW (ref 0.44–1.00)
Creatinine, Ser: 0.4 mg/dL — ABNORMAL LOW (ref 0.44–1.00)
Glucose, Bld: 198 mg/dL — ABNORMAL HIGH (ref 70–99)
Glucose, Bld: 175 mg/dL — ABNORMAL HIGH (ref 70–99)
Glucose, Bld: 134 mg/dL — ABNORMAL HIGH (ref 70–99)
Glucose, Bld: 147 mg/dL — ABNORMAL HIGH (ref 70–99)
Glucose, Bld: 172 mg/dL — ABNORMAL HIGH (ref 70–99)
Glucose, Bld: 175 mg/dL — ABNORMAL HIGH (ref 70–99)
HCT: 35 % — ABNORMAL LOW (ref 36.0–46.0)
HCT: 32 % — ABNORMAL LOW (ref 36.0–46.0)
HCT: 22 % — ABNORMAL LOW (ref 36.0–46.0)
HCT: 20 % — ABNORMAL LOW (ref 36.0–46.0)
HCT: 25 % — ABNORMAL LOW (ref 36.0–46.0)
HCT: 24 % — ABNORMAL LOW (ref 36.0–46.0)
Hemoglobin: 11.9 g/dL — ABNORMAL LOW (ref 12.0–15.0)
Hemoglobin: 10.9 g/dL — ABNORMAL LOW (ref 12.0–15.0)
Hemoglobin: 7.5 g/dL — ABNORMAL LOW (ref 12.0–15.0)
Hemoglobin: 6.8 g/dL — CL (ref 12.0–15.0)
Hemoglobin: 8.5 g/dL — ABNORMAL LOW (ref 12.0–15.0)
Hemoglobin: 8.2 g/dL — ABNORMAL LOW (ref 12.0–15.0)
Potassium: 3.8 mmol/L (ref 3.5–5.1)
Potassium: 3.7 mmol/L (ref 3.5–5.1)
Potassium: 4.4 mmol/L (ref 3.5–5.1)
Potassium: 4.5 mmol/L (ref 3.5–5.1)
Potassium: 4.9 mmol/L (ref 3.5–5.1)
Potassium: 3.6 mmol/L (ref 3.5–5.1)
Sodium: 142 mmol/L (ref 135–145)
Sodium: 140 mmol/L (ref 135–145)
Sodium: 140 mmol/L (ref 135–145)
Sodium: 139 mmol/L (ref 135–145)
Sodium: 140 mmol/L (ref 135–145)
Sodium: 142 mmol/L (ref 135–145)
TCO2: 21 mmol/L — ABNORMAL LOW (ref 22–32)
TCO2: 21 mmol/L — ABNORMAL LOW (ref 22–32)
TCO2: 21 mmol/L — ABNORMAL LOW (ref 22–32)
TCO2: 23 mmol/L (ref 22–32)
TCO2: 23 mmol/L (ref 22–32)
TCO2: 22 mmol/L (ref 22–32)

## 2022-09-20 LAB — PROTIME-INR
INR: 1.5 — ABNORMAL HIGH (ref 0.8–1.2)
INR: 1.3 — ABNORMAL HIGH (ref 0.8–1.2)
Prothrombin Time: 17.7 seconds — ABNORMAL HIGH (ref 11.4–15.2)
Prothrombin Time: 16.1 seconds — ABNORMAL HIGH (ref 11.4–15.2)

## 2022-09-20 LAB — PREPARE RBC (CROSSMATCH)

## 2022-09-20 LAB — CBC
HCT: 36 % (ref 36.0–46.0)
HCT: 30.9 % — ABNORMAL LOW (ref 36.0–46.0)
Hemoglobin: 12.3 g/dL (ref 12.0–15.0)
Hemoglobin: 10.5 g/dL — ABNORMAL LOW (ref 12.0–15.0)
MCH: 30.9 pg (ref 26.0–34.0)
MCH: 30.8 pg (ref 26.0–34.0)
MCHC: 34.2 g/dL (ref 30.0–36.0)
MCHC: 34 g/dL (ref 30.0–36.0)
MCV: 90.5 fL (ref 80.0–100.0)
MCV: 90.6 fL (ref 80.0–100.0)
Platelets: 107 10*3/uL — ABNORMAL LOW (ref 150–400)
Platelets: 96 10*3/uL — ABNORMAL LOW (ref 150–400)
RBC: 3.98 MIL/uL (ref 3.87–5.11)
RBC: 3.41 MIL/uL — ABNORMAL LOW (ref 3.87–5.11)
RDW: 13.6 % (ref 11.5–15.5)
RDW: 14.1 % (ref 11.5–15.5)
WBC: 9 10*3/uL (ref 4.0–10.5)
WBC: 7.6 10*3/uL (ref 4.0–10.5)
nRBC: 0 % (ref 0.0–0.2)
nRBC: 0 % (ref 0.0–0.2)

## 2022-09-20 LAB — COOXEMETRY PANEL
Carboxyhemoglobin: 1.6 % — ABNORMAL HIGH (ref 0.5–1.5)
Methemoglobin: 1 % (ref 0.0–1.5)
O2 Saturation: 59.9 %
Total hemoglobin: 10.8 g/dL — ABNORMAL LOW (ref 12.0–16.0)

## 2022-09-20 LAB — BASIC METABOLIC PANEL
Anion gap: 5 (ref 5–15)
BUN: 15 mg/dL (ref 8–23)
CO2: 21 mmol/L — ABNORMAL LOW (ref 22–32)
Calcium: 6.8 mg/dL — ABNORMAL LOW (ref 8.9–10.3)
Chloride: 114 mmol/L — ABNORMAL HIGH (ref 98–111)
Creatinine, Ser: 0.54 mg/dL (ref 0.44–1.00)
GFR, Estimated: >60 mL/min (ref >60)
Glucose, Bld: 198 mg/dL — ABNORMAL HIGH (ref 70–99)
Potassium: 4.1 mmol/L (ref 3.5–5.1)
Sodium: 140 mmol/L (ref 135–145)

## 2022-09-20 LAB — GLUCOSE, CAPILLARY
Glucose-Capillary: 196 mg/dL — ABNORMAL HIGH (ref 70–99)
Glucose-Capillary: 161 mg/dL — ABNORMAL HIGH (ref 70–99)
Glucose-Capillary: 166 mg/dL — ABNORMAL HIGH (ref 70–99)
Glucose-Capillary: 151 mg/dL — ABNORMAL HIGH (ref 70–99)
Glucose-Capillary: 152 mg/dL — ABNORMAL HIGH (ref 70–99)
Glucose-Capillary: 193 mg/dL — ABNORMAL HIGH (ref 70–99)
Glucose-Capillary: 149 mg/dL — ABNORMAL HIGH (ref 70–99)
Glucose-Capillary: 166 mg/dL — ABNORMAL HIGH (ref 70–99)

## 2022-09-20 LAB — POCT I-STAT EG7
Acid-base deficit: 4 mmol/L — ABNORMAL HIGH (ref 0.0–2.0)
Bicarbonate: 19.7 mmol/L — ABNORMAL LOW (ref 20.0–28.0)
Calcium, Ion: 0.96 mmol/L — ABNORMAL LOW (ref 1.15–1.40)
HCT: 20 % — ABNORMAL LOW (ref 36.0–46.0)
Hemoglobin: 6.8 g/dL — CL (ref 12.0–15.0)
O2 Saturation: 74 %
Potassium: 4.5 mmol/L (ref 3.5–5.1)
Sodium: 140 mmol/L (ref 135–145)
TCO2: 21 mmol/L — ABNORMAL LOW (ref 22–32)
pCO2, Ven: 30 mmHg — ABNORMAL LOW (ref 44–60)
pH, Ven: 7.424 (ref 7.25–7.43)
pO2, Ven: 38 mmHg (ref 32–45)

## 2022-09-20 LAB — ABO/RH: ABO/RH(D): O POS

## 2022-09-20 LAB — ECHO INTRAOPERATIVE TEE
Height: 63 in
Weight: 2416 oz

## 2022-09-20 LAB — APTT
aPTT: 36 seconds (ref 24–36)
aPTT: 34 seconds (ref 24–36)

## 2022-09-20 LAB — PLATELET COUNT: Platelets: 112 10*3/uL — ABNORMAL LOW (ref 150–400)

## 2022-09-20 LAB — MAGNESIUM: Magnesium: 2.8 mg/dL — ABNORMAL HIGH (ref 1.7–2.4)

## 2022-09-20 LAB — FIBRINOGEN: Fibrinogen: 193 mg/dL — ABNORMAL LOW (ref 210–475)

## 2022-09-20 LAB — HEMOGLOBIN AND HEMATOCRIT, BLOOD
HCT: 19.5 % — ABNORMAL LOW (ref 36.0–46.0)
Hemoglobin: 6.6 g/dL — CL (ref 12.0–15.0)

## 2022-09-20 SURGERY — CORONARY ARTERY BYPASS GRAFTING (CABG)
Anesthesia: General | Site: Chest

## 2022-09-20 MED ORDER — ORAL CARE MOUTH RINSE
15.0000 mL | OROMUCOSAL | Status: DC
  Administered 2022-09-20 (×2): 15 mL via OROMUCOSAL

## 2022-09-20 MED ORDER — METOPROLOL TARTRATE 5 MG/5ML IV SOLN: 2.5000 mg | INTRAVENOUS | Status: DC | PRN

## 2022-09-20 MED ORDER — PHENYLEPHRINE 80 MCG/ML (10ML) SYRINGE FOR IV PUSH (FOR BLOOD PRESSURE SUPPORT)
PREFILLED_SYRINGE | INTRAVENOUS | Status: DC | PRN
  Administered 2022-09-20 (×4): 40 ug via INTRAVENOUS

## 2022-09-20 MED ORDER — FAMOTIDINE IN NACL 20-0.9 MG/50ML-% IV SOLN
20.0000 mg | Freq: Two times a day (BID) | INTRAVENOUS | Status: AC
  Administered 2022-09-20 (×2): 20 mg via INTRAVENOUS
  Filled 2022-09-20 (×2): qty 50

## 2022-09-20 MED ORDER — MIDAZOLAM HCL 2 MG/2ML IJ SOLN: 2.0000 mg | INTRAMUSCULAR | Status: DC | PRN

## 2022-09-20 MED ORDER — CEFAZOLIN SODIUM-DEXTROSE 2-4 GM/100ML-% IV SOLN
INTRAVENOUS | Status: AC
  Filled 2022-09-20: qty 100

## 2022-09-20 MED ORDER — ALBUMIN HUMAN 5 % IV SOLN
250.0000 mL | INTRAVENOUS | Status: AC | PRN
  Administered 2022-09-20 (×4): 12.5 g via INTRAVENOUS
  Filled 2022-09-20 (×2): qty 250

## 2022-09-20 MED ORDER — SODIUM CHLORIDE 0.9 % IV SOLN: INTRAVENOUS | Status: DC | PRN

## 2022-09-20 MED ORDER — PROPOFOL 10 MG/ML IV BOLUS
INTRAVENOUS | Status: DC | PRN
  Administered 2022-09-20: 20 mg via INTRAVENOUS
  Administered 2022-09-20: 50 mg via INTRAVENOUS

## 2022-09-20 MED ORDER — POTASSIUM CHLORIDE 10 MEQ/50ML IV SOLN: 10.0000 meq | INTRAVENOUS | Status: AC

## 2022-09-20 MED ORDER — ASPIRIN 325 MG PO TBEC
325.0000 mg | DELAYED_RELEASE_TABLET | Freq: Every day | ORAL | Status: DC
  Administered 2022-09-21 – 2022-09-27 (×7): 325 mg via ORAL
  Filled 2022-09-20 (×7): qty 1

## 2022-09-20 MED ORDER — VANCOMYCIN HCL IN DEXTROSE 1-5 GM/200ML-% IV SOLN
1000.0000 mg | Freq: Once | INTRAVENOUS | Status: AC
  Administered 2022-09-20: 1000 mg via INTRAVENOUS
  Filled 2022-09-20: qty 200

## 2022-09-20 MED ORDER — CHLORHEXIDINE GLUCONATE 0.12 % MT SOLN
OROMUCOSAL | Status: AC
  Filled 2022-09-20: qty 15

## 2022-09-20 MED ORDER — MIDAZOLAM HCL (PF) 5 MG/ML IJ SOLN
INTRAMUSCULAR | Status: DC | PRN
  Administered 2022-09-20: 3 mg via INTRAVENOUS
  Administered 2022-09-20: 2 mg via INTRAVENOUS
  Administered 2022-09-20: 1 mg via INTRAVENOUS
  Administered 2022-09-20: 3 mg via INTRAVENOUS
  Administered 2022-09-20: 1 mg via INTRAVENOUS

## 2022-09-20 MED ORDER — SODIUM BICARBONATE 8.4 % IV SOLN
100.0000 meq | Freq: Once | INTRAVENOUS | Status: AC
  Administered 2022-09-20: 100 meq via INTRAVENOUS

## 2022-09-20 MED ORDER — DOCUSATE SODIUM 100 MG PO CAPS
200.0000 mg | ORAL_CAPSULE | Freq: Every day | ORAL | Status: DC
  Administered 2022-09-21 – 2022-09-26 (×5): 200 mg via ORAL
  Filled 2022-09-20 (×6): qty 2

## 2022-09-20 MED ORDER — ONDANSETRON HCL 4 MG/2ML IJ SOLN
INTRAMUSCULAR | Status: DC | PRN
  Administered 2022-09-20: 4 mg via INTRAVENOUS

## 2022-09-20 MED ORDER — CHLORHEXIDINE GLUCONATE 4 % EX LIQD: 30.0000 mL | CUTANEOUS | Status: DC

## 2022-09-20 MED ORDER — LEVOFLOXACIN IN D5W 750 MG/150ML IV SOLN
750.0000 mg | INTRAVENOUS | Status: AC
  Administered 2022-09-21: 750 mg via INTRAVENOUS
  Filled 2022-09-20: qty 150

## 2022-09-20 MED ORDER — BUPIVACAINE HCL (PF) 0.5 % IJ SOLN
INTRAMUSCULAR | Status: AC
  Filled 2022-09-20: qty 30

## 2022-09-20 MED ORDER — BISACODYL 10 MG RE SUPP: 10.0000 mg | Freq: Every day | RECTAL | Status: DC

## 2022-09-20 MED ORDER — LACTATED RINGERS IV SOLN: INTRAVENOUS | Status: DC | PRN

## 2022-09-20 MED ORDER — METOPROLOL TARTRATE 12.5 MG HALF TABLET
12.5000 mg | ORAL_TABLET | Freq: Two times a day (BID) | ORAL | Status: DC
  Administered 2022-09-21 – 2022-09-22 (×3): 12.5 mg via ORAL
  Filled 2022-09-20 (×3): qty 1

## 2022-09-20 MED ORDER — PROTAMINE SULFATE 10 MG/ML IV SOLN
INTRAVENOUS | Status: AC
  Filled 2022-09-20: qty 25

## 2022-09-20 MED ORDER — ACETAMINOPHEN 500 MG PO TABS
ORAL_TABLET | ORAL | Status: AC
  Filled 2022-09-20: qty 2

## 2022-09-20 MED ORDER — BUPIVACAINE LIPOSOME 1.3 % IJ SUSP
INTRAMUSCULAR | Status: AC
  Filled 2022-09-20: qty 20

## 2022-09-20 MED ORDER — MAGNESIUM SULFATE 4 GM/100ML IV SOLN
4.0000 g | Freq: Once | INTRAVENOUS | Status: AC
  Administered 2022-09-20: 4 g via INTRAVENOUS
  Filled 2022-09-20: qty 100

## 2022-09-20 MED ORDER — FENTANYL CITRATE (PF) 250 MCG/5ML IJ SOLN
INTRAMUSCULAR | Status: AC
  Filled 2022-09-20: qty 5

## 2022-09-20 MED ORDER — ORAL CARE MOUTH RINSE
15.0000 mL | OROMUCOSAL | Status: DC
  Administered 2022-09-20 – 2022-09-23 (×9): 15 mL via OROMUCOSAL

## 2022-09-20 MED ORDER — ORAL CARE MOUTH RINSE: 15.0000 mL | OROMUCOSAL | Status: DC | PRN

## 2022-09-20 MED ORDER — MIDAZOLAM HCL (PF) 10 MG/2ML IJ SOLN
INTRAMUSCULAR | Status: AC
  Filled 2022-09-20: qty 2

## 2022-09-20 MED ORDER — BISACODYL 5 MG PO TBEC
10.0000 mg | DELAYED_RELEASE_TABLET | Freq: Every day | ORAL | Status: DC
  Administered 2022-09-21 – 2022-09-24 (×4): 10 mg via ORAL
  Filled 2022-09-20 (×4): qty 2

## 2022-09-20 MED ORDER — ACETAMINOPHEN 500 MG PO TABS
1000.0000 mg | ORAL_TABLET | Freq: Four times a day (QID) | ORAL | Status: AC
  Administered 2022-09-20 – 2022-09-24 (×9): 1000 mg via ORAL
  Filled 2022-09-20 (×10): qty 2

## 2022-09-20 MED ORDER — DEXAMETHASONE SODIUM PHOSPHATE 10 MG/ML IJ SOLN
INTRAMUSCULAR | Status: DC | PRN
  Administered 2022-09-20: 10 mg via INTRAVENOUS

## 2022-09-20 MED ORDER — NITROGLYCERIN IN D5W 200-5 MCG/ML-% IV SOLN: 0.0000 ug/min | INTRAVENOUS | Status: DC

## 2022-09-20 MED ORDER — METOPROLOL TARTRATE 25 MG/10 ML ORAL SUSPENSION
12.5000 mg | Freq: Two times a day (BID) | ORAL | Status: DC
  Administered 2022-09-22: 12.5 mg
  Filled 2022-09-20: qty 10

## 2022-09-20 MED ORDER — SODIUM CHLORIDE 0.9% FLUSH: 10.0000 mL | INTRAVENOUS | Status: DC | PRN

## 2022-09-20 MED ORDER — PHENYLEPHRINE 80 MCG/ML (10ML) SYRINGE FOR IV PUSH (FOR BLOOD PRESSURE SUPPORT)
PREFILLED_SYRINGE | INTRAVENOUS | Status: AC
  Filled 2022-09-20: qty 10

## 2022-09-20 MED ORDER — FENTANYL CITRATE (PF) 250 MCG/5ML IJ SOLN
INTRAMUSCULAR | Status: DC | PRN
  Administered 2022-09-20: 100 ug via INTRAVENOUS
  Administered 2022-09-20: 50 ug via INTRAVENOUS
  Administered 2022-09-20: 100 ug via INTRAVENOUS
  Administered 2022-09-20: 200 ug via INTRAVENOUS
  Administered 2022-09-20: 150 ug via INTRAVENOUS
  Administered 2022-09-20 (×3): 100 ug via INTRAVENOUS

## 2022-09-20 MED ORDER — ACETAMINOPHEN 650 MG RE SUPP
650.0000 mg | Freq: Once | RECTAL | Status: AC
  Administered 2022-09-20: 650 mg via RECTAL

## 2022-09-20 MED ORDER — PANTOPRAZOLE SODIUM 40 MG PO TBEC
40.0000 mg | DELAYED_RELEASE_TABLET | Freq: Every day | ORAL | Status: DC
  Administered 2022-09-22 – 2022-09-27 (×6): 40 mg via ORAL
  Filled 2022-09-20 (×6): qty 1

## 2022-09-20 MED ORDER — ~~LOC~~ CARDIAC SURGERY, PATIENT & FAMILY EDUCATION
Freq: Once | Status: DC
  Filled 2022-09-20: qty 1

## 2022-09-20 MED ORDER — LACTATED RINGERS IV SOLN: INTRAVENOUS | Status: DC

## 2022-09-20 MED ORDER — ATORVASTATIN CALCIUM 80 MG PO TABS
80.0000 mg | ORAL_TABLET | Freq: Every day | ORAL | Status: DC
  Administered 2022-09-21 – 2022-09-27 (×7): 80 mg via ORAL
  Filled 2022-09-20 (×7): qty 1

## 2022-09-20 MED ORDER — INSULIN REGULAR(HUMAN) IN NACL 100-0.9 UT/100ML-% IV SOLN
INTRAVENOUS | Status: DC
  Administered 2022-09-21: 3.8 [IU]/h via INTRAVENOUS
  Filled 2022-09-20: qty 100

## 2022-09-20 MED ORDER — SODIUM CHLORIDE 0.45 % IV SOLN: INTRAVENOUS | Status: DC | PRN

## 2022-09-20 MED ORDER — MORPHINE SULFATE (PF) 2 MG/ML IV SOLN
1.0000 mg | INTRAVENOUS | Status: DC | PRN
  Administered 2022-09-20 – 2022-09-22 (×3): 2 mg via INTRAVENOUS
  Filled 2022-09-20 (×3): qty 1

## 2022-09-20 MED ORDER — DEXAMETHASONE SODIUM PHOSPHATE 10 MG/ML IJ SOLN
INTRAMUSCULAR | Status: AC
  Filled 2022-09-20: qty 1

## 2022-09-20 MED ORDER — CHLORHEXIDINE GLUCONATE CLOTH 2 % EX PADS
6.0000 | MEDICATED_PAD | Freq: Every day | CUTANEOUS | Status: DC
  Administered 2022-09-20 – 2022-09-25 (×5): 6 via TOPICAL

## 2022-09-20 MED ORDER — HEMOSTATIC AGENTS (NO CHARGE) OPTIME
TOPICAL | Status: DC | PRN
  Administered 2022-09-20: 1 via TOPICAL
  Administered 2022-09-20 (×2): 2 via TOPICAL
  Administered 2022-09-20: 1 via TOPICAL

## 2022-09-20 MED ORDER — DEXMEDETOMIDINE HCL IN NACL 400 MCG/100ML IV SOLN
0.0000 ug/kg/h | INTRAVENOUS | Status: DC
  Filled 2022-09-20: qty 100

## 2022-09-20 MED ORDER — ONDANSETRON HCL 4 MG/2ML IJ SOLN
INTRAMUSCULAR | Status: AC
  Filled 2022-09-20: qty 2

## 2022-09-20 MED ORDER — ASPIRIN 81 MG PO CHEW: 324.0000 mg | CHEWABLE_TABLET | Freq: Every day | ORAL | Status: DC

## 2022-09-20 MED ORDER — BUPIVACAINE LIPOSOME 1.3 % IJ SUSP
INTRAMUSCULAR | Status: DC | PRN
  Administered 2022-09-20: 50 mL

## 2022-09-20 MED ORDER — LACTATED RINGERS IV SOLN
500.0000 mL | Freq: Once | INTRAVENOUS | Status: AC | PRN
  Administered 2022-09-21: 500 mL via INTRAVENOUS

## 2022-09-20 MED ORDER — CHLORHEXIDINE GLUCONATE 0.12 % MT SOLN
15.0000 mL | OROMUCOSAL | Status: AC
  Administered 2022-09-20: 15 mL via OROMUCOSAL
  Filled 2022-09-20: qty 15

## 2022-09-20 MED ORDER — DEXTROSE 50 % IV SOLN: 0.0000 mL | INTRAVENOUS | Status: DC | PRN

## 2022-09-20 MED ORDER — ROCURONIUM BROMIDE 10 MG/ML (PF) SYRINGE
PREFILLED_SYRINGE | INTRAVENOUS | Status: AC
  Filled 2022-09-20: qty 20

## 2022-09-20 MED ORDER — NOREPINEPHRINE 4 MG/250ML-% IV SOLN: 0.0000 ug/min | INTRAVENOUS | Status: DC

## 2022-09-20 MED ORDER — ALBUMIN HUMAN 5 % IV SOLN: INTRAVENOUS | Status: DC | PRN

## 2022-09-20 MED ORDER — ONDANSETRON HCL 4 MG/2ML IJ SOLN
4.0000 mg | Freq: Four times a day (QID) | INTRAMUSCULAR | Status: DC | PRN
  Administered 2022-09-22: 4 mg via INTRAVENOUS
  Filled 2022-09-20: qty 2

## 2022-09-20 MED ORDER — PROPOFOL 10 MG/ML IV BOLUS
INTRAVENOUS | Status: AC
  Filled 2022-09-20: qty 20

## 2022-09-20 MED ORDER — CHLORHEXIDINE GLUCONATE 0.12 % MT SOLN: 15.0000 mL | Freq: Once | OROMUCOSAL | Status: DC

## 2022-09-20 MED ORDER — SODIUM CHLORIDE 0.9 % IV SOLN: INTRAVENOUS | Status: DC

## 2022-09-20 MED ORDER — ROCURONIUM BROMIDE 10 MG/ML (PF) SYRINGE
PREFILLED_SYRINGE | INTRAVENOUS | Status: DC | PRN
  Administered 2022-09-20: 50 mg via INTRAVENOUS
  Administered 2022-09-20: 100 mg via INTRAVENOUS
  Administered 2022-09-20: 50 mg via INTRAVENOUS

## 2022-09-20 MED ORDER — HEPARIN SODIUM (PORCINE) 1000 UNIT/ML IJ SOLN
INTRAMUSCULAR | Status: AC
  Filled 2022-09-20: qty 1

## 2022-09-20 MED ORDER — SODIUM CHLORIDE 0.9 % IV SOLN: 250.0000 mL | INTRAVENOUS | Status: DC

## 2022-09-20 MED ORDER — SODIUM CHLORIDE 0.9% FLUSH
3.0000 mL | Freq: Two times a day (BID) | INTRAVENOUS | Status: DC
  Administered 2022-09-21 – 2022-09-27 (×13): 3 mL via INTRAVENOUS

## 2022-09-20 MED ORDER — PHENYLEPHRINE HCL-NACL 20-0.9 MG/250ML-% IV SOLN: 0.0000 ug/min | INTRAVENOUS | Status: DC

## 2022-09-20 MED ORDER — ACETAMINOPHEN 160 MG/5ML PO SOLN: 650.0000 mg | Freq: Once | ORAL | Status: AC

## 2022-09-20 MED ORDER — OXYCODONE HCL 5 MG PO TABS
5.0000 mg | ORAL_TABLET | ORAL | Status: DC | PRN
  Administered 2022-09-21 (×2): 5 mg via ORAL
  Filled 2022-09-20 (×2): qty 1

## 2022-09-20 MED ORDER — PROTAMINE SULFATE 10 MG/ML IV SOLN
INTRAVENOUS | Status: DC | PRN
  Administered 2022-09-20 (×4): 30 mg via INTRAVENOUS
  Administered 2022-09-20: 10 mg via INTRAVENOUS
  Administered 2022-09-20: 30 mg via INTRAVENOUS
  Administered 2022-09-20: 20 mg via INTRAVENOUS
  Administered 2022-09-20 (×2): 30 mg via INTRAVENOUS

## 2022-09-20 MED ORDER — SODIUM CHLORIDE 0.9% FLUSH: 3.0000 mL | INTRAVENOUS | Status: DC | PRN

## 2022-09-20 MED ORDER — SODIUM CHLORIDE 0.9% FLUSH
10.0000 mL | Freq: Two times a day (BID) | INTRAVENOUS | Status: DC
  Administered 2022-09-20 – 2022-09-22 (×3): 10 mL

## 2022-09-20 MED ORDER — ACETAMINOPHEN 160 MG/5ML PO SOLN
1000.0000 mg | Freq: Four times a day (QID) | ORAL | Status: AC
  Administered 2022-09-22 – 2022-09-25 (×10): 1000 mg
  Filled 2022-09-20 (×10): qty 40.6

## 2022-09-20 MED ORDER — EPINEPHRINE HCL 5 MG/250ML IV SOLN IN NS: 0.0000 ug/min | INTRAVENOUS | Status: DC

## 2022-09-20 MED ORDER — TRAMADOL HCL 50 MG PO TABS
50.0000 mg | ORAL_TABLET | ORAL | Status: DC | PRN
  Administered 2022-09-21: 50 mg via ORAL
  Administered 2022-09-21 – 2022-09-22 (×3): 100 mg via ORAL
  Administered 2022-09-22 – 2022-09-24 (×7): 50 mg via ORAL
  Administered 2022-09-25: 100 mg via ORAL
  Administered 2022-09-26: 50 mg via ORAL
  Filled 2022-09-20: qty 1
  Filled 2022-09-20: qty 2
  Filled 2022-09-20 (×3): qty 1
  Filled 2022-09-20: qty 2
  Filled 2022-09-20 (×5): qty 1
  Filled 2022-09-20 (×2): qty 2

## 2022-09-20 MED ORDER — HEPARIN SODIUM (PORCINE) 1000 UNIT/ML IJ SOLN
INTRAMUSCULAR | Status: DC | PRN
  Administered 2022-09-20: 5000 [IU] via INTRAVENOUS
  Administered 2022-09-20: 19000 [IU] via INTRAVENOUS

## 2022-09-20 MED ORDER — METOPROLOL TARTRATE 12.5 MG HALF TABLET
ORAL_TABLET | ORAL | Status: AC
  Filled 2022-09-20: qty 1

## 2022-09-20 MED ORDER — METOPROLOL TARTRATE 12.5 MG HALF TABLET
12.5000 mg | ORAL_TABLET | Freq: Once | ORAL | Status: AC
  Administered 2022-09-20: 12.5 mg via ORAL

## 2022-09-20 SURGICAL SUPPLY — 116 items
APPLIER CLIP 11 MED OPEN (CLIP) ×2
APPLIER CLIP 9.375 SM OPEN (CLIP) ×6
BAG DECANTER FOR FLEXI CONT (MISCELLANEOUS) ×2 IMPLANT
BLADE CLIPPER SURG (BLADE) ×2 IMPLANT
BLADE MINI 60D BLUE (BLADE) IMPLANT
BLADE STERNUM SYSTEM 6 (BLADE) ×2 IMPLANT
BLOWER MISTER CAL-MED (MISCELLANEOUS) IMPLANT
BNDG ELASTIC 4X5.8 VLCR NS LF (GAUZE/BANDAGES/DRESSINGS) IMPLANT
BNDG ELASTIC 4X5.8 VLCR STR LF (GAUZE/BANDAGES/DRESSINGS) ×2 IMPLANT
BNDG ELASTIC 6X10 VLCR STRL LF (GAUZE/BANDAGES/DRESSINGS) IMPLANT
BNDG ELASTIC 6X5.8 VLCR STR LF (GAUZE/BANDAGES/DRESSINGS) ×2 IMPLANT
BNDG GAUZE DERMACEA FLUFF 4 (GAUZE/BANDAGES/DRESSINGS) ×2 IMPLANT
CANISTER SUCT 3000ML PPV (MISCELLANEOUS) ×2 IMPLANT
CANNULA AORTIC ROOT 9FR (CANNULA) IMPLANT
CANNULA EZ GLIDE AORTIC 21FR (CANNULA) IMPLANT
CANNULA MC2 2 STG 29/37 NON-V (CANNULA) IMPLANT
CANNULA MC2 TWO STAGE (CANNULA) ×2
CANNULA VESSEL 3MM 2 BLNT TIP (CANNULA) IMPLANT
CATH ROBINSON RED A/P 18FR (CATHETERS) ×4 IMPLANT
CLIP APPLIE 11 MED OPEN (CLIP) IMPLANT
CLIP APPLIE 9.375 SM OPEN (CLIP) IMPLANT
CLIP TI LARGE 6 (CLIP) IMPLANT
CLIP VESOCCLUDE MED 24/CT (CLIP) IMPLANT
CLIP VESOCCLUDE SM WIDE 24/CT (CLIP) IMPLANT
CNTNR URN SCR LID CUP LEK RST (MISCELLANEOUS) IMPLANT
CONT SPEC 4OZ STRL OR WHT (MISCELLANEOUS) ×2
CONTAINER PROTECT SURGISLUSH (MISCELLANEOUS) ×4 IMPLANT
DERMABOND ADVANCED .7 DNX12 (GAUZE/BANDAGES/DRESSINGS) IMPLANT
DRAPE CARDIOVASCULAR INCISE (DRAPES) ×2
DRAPE SRG 135X102X78XABS (DRAPES) ×2 IMPLANT
DRAPE WARM FLUID 44X44 (DRAPES) ×2 IMPLANT
DRSG AQUACEL AG ADV 3.5X14 (GAUZE/BANDAGES/DRESSINGS) IMPLANT
DRSG COVADERM 4X14 (GAUZE/BANDAGES/DRESSINGS) ×2 IMPLANT
ELECT CAUTERY BLADE 6.4 (BLADE) ×2 IMPLANT
ELECT REM PT RETURN 9FT ADLT (ELECTROSURGICAL) ×4
ELECTRODE REM PT RTRN 9FT ADLT (ELECTROSURGICAL) ×4 IMPLANT
FELT TEFLON 1X6 (MISCELLANEOUS) ×4 IMPLANT
GAUZE 4X4 16PLY ~~LOC~~+RFID DBL (SPONGE) ×2 IMPLANT
GAUZE SPONGE 4X4 12PLY STRL (GAUZE/BANDAGES/DRESSINGS) ×4 IMPLANT
GLOVE BIO SURGEON STRL SZ 6 (GLOVE) IMPLANT
GLOVE BIO SURGEON STRL SZ 6.5 (GLOVE) IMPLANT
GLOVE BIO SURGEON STRL SZ7 (GLOVE) IMPLANT
GLOVE BIO SURGEON STRL SZ7.5 (GLOVE) IMPLANT
GLOVE BIOGEL PI IND STRL 6 (GLOVE) IMPLANT
GLOVE BIOGEL PI IND STRL 6.5 (GLOVE) IMPLANT
GLOVE BIOGEL PI IND STRL 7.0 (GLOVE) IMPLANT
GLOVE ORTHO TXT STRL SZ7.5 (GLOVE) IMPLANT
GLOVE SURG MICRO LTX SZ7 (GLOVE) ×4 IMPLANT
GOWN STRL REUS W/ TWL LRG LVL3 (GOWN DISPOSABLE) ×8 IMPLANT
GOWN STRL REUS W/ TWL XL LVL3 (GOWN DISPOSABLE) ×2 IMPLANT
GOWN STRL REUS W/TWL LRG LVL3 (GOWN DISPOSABLE) ×8
GOWN STRL REUS W/TWL XL LVL3 (GOWN DISPOSABLE) ×2
HEMOSTAT NU-KNIT SURGICAL 3X4 (HEMOSTASIS) ×4 IMPLANT
HEMOSTAT POWDER SURGIFOAM 1G (HEMOSTASIS) ×6 IMPLANT
HEMOSTAT SURGICEL 2X14 (HEMOSTASIS) ×2 IMPLANT
INSERT FOGARTY 61MM (MISCELLANEOUS) IMPLANT
INSERT FOGARTY XLG (MISCELLANEOUS) IMPLANT
KIT BASIN OR (CUSTOM PROCEDURE TRAY) ×2 IMPLANT
KIT CATH CPB BARTLE (MISCELLANEOUS) ×2 IMPLANT
KIT SUCTION CATH 14FR (SUCTIONS) ×2 IMPLANT
KIT TURNOVER KIT B (KITS) ×2 IMPLANT
KIT VASOVIEW HEMOPRO 2 VH 4000 (KITS) ×2 IMPLANT
KNIFE MICRO-UNI 3.5 30 DEG (BLADE) IMPLANT
NS IRRIG 1000ML POUR BTL (IV SOLUTION) ×10 IMPLANT
OFFPUMP STABILIZER SUV (MISCELLANEOUS) ×2 IMPLANT
PACK E OPEN HEART (SUTURE) ×2 IMPLANT
PACK OPEN HEART (CUSTOM PROCEDURE TRAY) ×2 IMPLANT
PAD ARMBOARD 7.5X6 YLW CONV (MISCELLANEOUS) ×4 IMPLANT
PAD CARDIAC INSULATION (MISCELLANEOUS) IMPLANT
PAD ELECT DEFIB RADIOL ZOLL (MISCELLANEOUS) ×2 IMPLANT
PENCIL BUTTON HOLSTER BLD 10FT (ELECTRODE) ×2 IMPLANT
POSITIONER HEAD DONUT 9IN (MISCELLANEOUS) ×2 IMPLANT
PUNCH AORTIC ROTATE 4.0MM (MISCELLANEOUS) IMPLANT
PUNCH AORTIC ROTATE 4.5MM 8IN (MISCELLANEOUS) ×2 IMPLANT
PUNCH AORTIC ROTATE 5MM 8IN (MISCELLANEOUS) IMPLANT
SET MPS 3-ND DEL (MISCELLANEOUS) IMPLANT
SPONGE INTESTINAL PEANUT (DISPOSABLE) IMPLANT
SUT BONE WAX W31G (SUTURE) ×4 IMPLANT
SUT ETHILON 3 0 PS 1 (SUTURE) IMPLANT
SUT MNCRL AB 4-0 PS2 18 (SUTURE) IMPLANT
SUT PROLENE 3 0 SH DA (SUTURE) IMPLANT
SUT PROLENE 3 0 SH1 36 (SUTURE) ×2 IMPLANT
SUT PROLENE 4 0 RB 1 (SUTURE) ×6
SUT PROLENE 4 0 SH DA (SUTURE) IMPLANT
SUT PROLENE 4-0 RB1 .5 CRCL 36 (SUTURE) IMPLANT
SUT PROLENE 4-0 RB1 18X2 ARM (SUTURE) IMPLANT
SUT PROLENE 5 0 C 1 36 (SUTURE) IMPLANT
SUT PROLENE 6 0 C 1 24 (SUTURE) IMPLANT
SUT PROLENE 6 0 C 1 30 (SUTURE) IMPLANT
SUT PROLENE 7 0 BV 1 (SUTURE) IMPLANT
SUT PROLENE 7 0 BV1 MDA (SUTURE) ×2 IMPLANT
SUT PROLENE 8 0 BV175 6 (SUTURE) IMPLANT
SUT SILK  1 MH (SUTURE) ×8
SUT SILK 1 MH (SUTURE) IMPLANT
SUT SILK 2 0 SH (SUTURE) IMPLANT
SUT STEEL 6MS V (SUTURE) IMPLANT
SUT STEEL STERNAL CCS#1 18IN (SUTURE) IMPLANT
SUT STEEL SZ 6 DBL 3X14 BALL (SUTURE) IMPLANT
SUT VIC AB 1 CTX 36 (SUTURE) ×4
SUT VIC AB 1 CTX36XBRD ANBCTR (SUTURE) ×4 IMPLANT
SUT VIC AB 2-0 CT1 27 (SUTURE)
SUT VIC AB 2-0 CT1 TAPERPNT 27 (SUTURE) IMPLANT
SUT VIC AB 2-0 CTX 27 (SUTURE) IMPLANT
SUT VIC AB 2-0 CTX 36 (SUTURE) IMPLANT
SUT VIC AB 3-0 SH 27 (SUTURE)
SUT VIC AB 3-0 SH 27X BRD (SUTURE) IMPLANT
SUT VIC AB 3-0 X1 27 (SUTURE) IMPLANT
SUT VICRYL 4-0 PS2 18IN ABS (SUTURE) IMPLANT
SYSTEM SAHARA CHEST DRAIN ATS (WOUND CARE) ×2 IMPLANT
TAPE CLOTH SURG 4X10 WHT LF (GAUZE/BANDAGES/DRESSINGS) IMPLANT
TOWEL GREEN STERILE (TOWEL DISPOSABLE) ×2 IMPLANT
TOWEL GREEN STERILE FF (TOWEL DISPOSABLE) ×2 IMPLANT
TRAY FOLEY SLVR 16FR TEMP STAT (SET/KITS/TRAYS/PACK) ×2 IMPLANT
TUBING LAP HI FLOW INSUFFLATIO (TUBING) ×2 IMPLANT
UNDERPAD 30X36 HEAVY ABSORB (UNDERPADS AND DIAPERS) ×2 IMPLANT
WATER STERILE IRR 1000ML POUR (IV SOLUTION) ×4 IMPLANT

## 2022-09-20 NOTE — Anesthesia Procedure Notes (Signed)
Central Venous Catheter Insertion Performed by: Murvin Natal, MD, anesthesiologist Start/End10/16/2023 7:10 AM, 09/20/2022 7:25 AM Patient location: Pre-op. Preanesthetic checklist: patient identified, IV checked, site marked, risks and benefits discussed, surgical consent, monitors and equipment checked, pre-op evaluation, timeout performed and anesthesia consent Position: Trendelenburg Lidocaine 1% used for infiltration and patient sedated Hand hygiene performed , maximum sterile barriers used  and Seldinger technique used Catheter size: 9 Fr Total catheter length 12. PA cath was placed.MAC introducer Swan type:thermodilution PA Cath depth:42 Procedure performed using ultrasound guided technique. Ultrasound Notes:anatomy identified, needle tip was noted to be adjacent to the nerve/plexus identified and no ultrasound evidence of intravascular and/or intraneural injection Attempts: 1 Following insertion, line sutured, dressing applied and Biopatch. Post procedure assessment: blood return through all ports, free fluid flow and no air  Patient tolerated the procedure well with no immediate complications.

## 2022-09-20 NOTE — Procedures (Signed)
Extubation Procedure Note  Patient Details:   Name: Sheila Keller DOB: 19-Oct-1946 MRN: [id number]   Airway Documentation:    Vent end date: 09/20/22 Vent end time: 1843   Evaluation  O2 sats: stable throughout Complications: No apparent complications Patient did tolerate procedure well. Bilateral Breath Sounds: Clear   Yes 4L min [place] NIF-27 [id number] Incentive spirometer instructed.  Revonda Standard 09/20/2022, 6:44 PM

## 2022-09-20 NOTE — Anesthesia Procedure Notes (Signed)
Arterial Line Insertion Start/End10/16/2023 7:10 AM, 09/20/2022 7:17 AM Performed by: Harden Mo, CRNA, CRNA  Patient location: Pre-op. Preanesthetic checklist: patient identified, IV checked, site marked, risks and benefits discussed, surgical consent, monitors and equipment checked, pre-op evaluation, timeout performed and anesthesia consent Lidocaine 1% used for infiltration Left, radial was placed Catheter size: 20 G Hand hygiene performed  and maximum sterile barriers used   Attempts: 1 Procedure performed without using ultrasound guided technique. Ultrasound Notes:anatomy identified, needle tip was noted to be adjacent to the nerve/plexus identified and no ultrasound evidence of intravascular and/or intraneural injection Following insertion, dressing applied and Biopatch. Post procedure assessment: normal and unchanged  Patient tolerated the procedure well with no immediate complications.

## 2022-09-20 NOTE — Progress Notes (Signed)
Echocardiogram Echocardiogram Transesophageal has been performed.  Fidel Levy 09/20/2022, 8:46 AM

## 2022-09-20 NOTE — Progress Notes (Signed)
Initial post-op ABG and first two CBG values scanned under incorrect MRN. POCT edit sheets sent to correct results.   Initial post-op ABG (on 50% FiO2): pH 7.315 pCO2 36.3 pO2 137 HCO3 18.5 TCO2 20 Sat 99 BE -7  Initial CBG: 165  2nd CBG: 150

## 2022-09-20 NOTE — Brief Op Note (Signed)
09/20/2022  7:07 AM  PATIENT:  Sheila Keller  76 y.o. female  PRE-OPERATIVE DIAGNOSIS:  Coronary Artery Disease Aortic Stenosis  POST-OPERATIVE DIAGNOSIS:  Coronary Artery Disease Aortic Stenosis  PROCEDURE:  Procedure(s): CORONARY ARTERY BYPASS GRAFTING (CABG) X3 USING ENDOSCOPICALLY HARVESTED RIGHT GREATER SAPHENOUS VEIN (N/A) TRANSESOPHAGEAL ECHOCARDIOGRAM (TEE) (N/A) Vein harvest time: 79mn Vein prep time: 171m LIMA-LAD SVG-OM1 SVG-OM2  SURGEON:  Surgeon(s) and Role:    * Enter, DaPierre BaliMD - Primary  PHYSICIAN ASSISTANT: Cortnee Steinmiller PA-C  ASSISTANTS: RNFA - STAFF   ANESTHESIA:   general  EBL:  740 mL   BLOOD ADMINISTERED: 315 CC PRBC  DRAINS:  2 PLEURAL AND ONE MEDIASTINAL    LOCAL MEDICATIONS USED:  BUPIVICAINE  and OTHER EXPAREL  SPECIMEN:  No Specimen  DISPOSITION OF SPECIMEN:  N/A  COUNTS:  YES  TOURNIQUET:  * No tourniquets in log *  DICTATION: .Dragon Dictation  PLAN OF CARE: Admit to inpatient   PATIENT DISPOSITION:  ICU - intubated and hemodynamically stable.   Delay start of Pharmacological VTE agent (>24hrs) due to surgical blood loss or risk of bleeding: yes  COMPLICATIONS: NO KNOWN

## 2022-09-20 NOTE — Progress Notes (Signed)
Patient ID: Sheila Keller, female   DOB: 09-24-1946, 76 y.o.   MRN: [id number]   TCTS Evening Rounds:   Hemodynamically stable in sinus rhythm on NE 2 CI = 1.3 but PA pressures normal, UO good. Co-ox pending. EF normal.  Has started to wake up on vent.   Urine output good  CT output low  CBC    Component Value Date/Time   WBC 9.0 09/20/2022 1418   RBC 3.98 09/20/2022 1418   HGB 12.3 09/20/2022 1418   HGB 12.7 08/26/2022 1448   HCT 36.0 09/20/2022 1418   HCT 38.1 08/26/2022 1448   PLT 107 (L) 09/20/2022 1418   PLT 227 08/26/2022 1448   MCV 90.5 09/20/2022 1418   MCV 91 08/26/2022 1448   MCH 30.9 09/20/2022 1418   MCHC 34.2 09/20/2022 1418   RDW 13.6 09/20/2022 1418   RDW 13.0 08/26/2022 1448   LYMPHSABS 1.6 06/22/2013 0925   MONOABS 0.4 06/22/2013 0925   EOSABS 0.5 06/22/2013 0925   BASOSABS 0.0 06/22/2013 0925     BMET    Component Value Date/Time   NA 142 09/20/2022 1249   NA 143 08/26/2022 1448   K 3.6 09/20/2022 1249   CL 108 09/20/2022 1249   CO2 17 (L) 09/16/2022 0925   GLUCOSE 175 (H) 09/20/2022 1249   BUN 17 09/20/2022 1249   BUN 19 08/26/2022 1448   CREATININE 0.40 (L) 09/20/2022 1249   CREATININE 0.82 04/19/2022 1018   CALCIUM 9.6 09/16/2022 0925   CALCIUM 8.9 04/02/2013 1419   EGFR 46 (L) 08/26/2022 1448   GFRNONAA >60 09/16/2022 0925     A/P:  Stable postop course. Continue current plans

## 2022-09-20 NOTE — Op Note (Signed)
Operative Note: Patient Name: Sheila Keller Date of Birth: 06-28-1946 Operation: CABG x 3 (LIMA-LAD, SVG - OM1, SVG - OM2)   Indications: This is a 76 y.o.-year-old female with a history of aortic stenosis, hypertension, diabetes, hyperlipidemia who presents for CT surgical evaluation of left main coronary disease and aortic stenosis. We had a Friday meeting morning about here as well where it was discussed that her pressing issue is severe left main, and the moderate AS could be TAVR later. She in particular has a small aortic annulus and would likely have better hemodynamics with a TAVR later, if needed. I discussed medical and surgical options with the patient, a family member, and a friend who used to be a trauma nurse.  Risks/benefits/alternatives discussed including 1-2% mortality, 5% morbidity, and >93% standard recovery. All questions asked and answered.   Times:  CCX 80 min CPB 138 min   Assistant: Jadene Pierini, [place]   Findings:  Adequate conduits and targets LAD 2.71m  OM1 1.567mOM2 2.45m945mIMA graft large with excellent flow Saphenous Vein 3mm39mverage quality.  Overall tissue quality worse than stated age of 76. 65Pre-bypass TEE -  EF near normal Re-reviewed aortic valve: mean gradient of 22. Moderate AS.   Post-bypass TEE -  Similar EF   Drains: 2 28 blakes and 1 JP drain. Left lateral blake in left pleura  Right blake and JP in mediastinum.  Description of Procedure: The patient was brought in the operating room and laid in supine position. She already had swan and central line at that point.  The patient was prepped and draped in standard fashion. Additional lines were placed by anesthesia along with a single-lumen endotracheal tube. Saphenous vein was harvested endoscopically by Mr. GoldGirtha Rmternotomy was performed after parasternal block with local analgesia and the pericardium was opened.  Full dose heparin was given and aortic and venous cannulas were inserted the  patient was placed on cardiopulmonary bypass and using a cross-clamp cardiac arrest was achieved with 1.2 L of modified blood [place] cardioplegia.  Ice was applied to the heart with an insulation for phrenic nerve. Targets evaluated and vein graft sewn to OM2, distal then proximal with 7-0 and 6-0 prolene. Vein graft sewn to OM1, distal then proximal with 7-0 and 6-0 prolene. I gave an additional 200cc plegia at this point. Next, LIMA sewn to LAD with 7-0 prolene. Crossclamp removed with head down and flow down. A and V wires placed, and eventually heart came back into sinus. Heart took over circulation and cannulas were removed and protamine given following this. Hemostasis was achieved. 3 chest tubes placed. The chest was then closed in interrupted steel wire and the presternal layers were closed in 3 layers of running absorbable suture with dermabond on top.  I placed 4 3-0 Nylon sutures as well on the inferior half of the incision.   The patient was labile in BP after the procedure ranging from 80s/50s to 150s/100s. The majority of this was on levo alone, and we weaned off epi quickly. This looked to be related predominantly to volume. She was at around 2-4 of levo on transport to ICU.   DaniJustice RocherCV Surgery

## 2022-09-20 NOTE — Anesthesia Preprocedure Evaluation (Signed)
Anesthesia Evaluation  Patient identified by MRN, date of birth, ID band Patient awake    Reviewed: Allergy & Precautions, NPO status , Patient's Chart, lab work & pertinent test results  Airway Mallampati: III       Dental no notable dental hx.    Pulmonary neg pulmonary ROS   Pulmonary exam normal        Cardiovascular hypertension, Pt. on medications + CAD  + Valvular Problems/Murmurs AS  Rhythm:Regular Rate:Normal + Systolic murmurs ECHO: 1. Left ventricular ejection fraction, by estimation, is 70 to 75%. The  left ventricle has hyperdynamic function. The left ventricle has no  regional wall motion abnormalities. There is mild left ventricular  hypertrophy. Left ventricular diastolic  parameters are consistent with Grade I diastolic dysfunction (impaired  relaxation). Midcavitary gradient measures 15mHg, 467mg with Valsalva   2. Right ventricular systolic function is normal. The right ventricular  size is normal. Tricuspid regurgitation signal is inadequate for assessing  PA pressure.   3. The mitral valve is normal in structure. No evidence of mitral valve  regurgitation.   4. The aortic valve is calcified. There is moderate calcification of the  aortic valve. Aortic valve regurgitation is not visualized. Moderate  aortic valve stenosis. Vmax 3.0 m/s, MG 24 mmHg, AVA 0.8 cm^2, DI 0.34   5. The inferior vena cava is normal in size with greater than 50%  respiratory variability, suggesting right atrial pressure of 3 mmHg.     Neuro/Psych   Anxiety     negative neurological ROS     GI/Hepatic negative GI ROS, Neg liver ROS,,,  Endo/Other  diabetes, Oral Hypoglycemic Agents    Renal/GU negative Renal ROS     Musculoskeletal   Abdominal   Peds  Hematology negative hematology ROS (+)   Anesthesia Other Findings CAD  AS    Reproductive/Obstetrics                              Anesthesia Physical Anesthesia Plan  ASA: 4  Anesthesia Plan: General   Post-op Pain Management:    Induction: Intravenous  PONV Risk Score and Plan: 3 and Ondansetron, Dexamethasone, Midazolam and Treatment may vary due to age or medical condition  Airway Management Planned: Oral ETT  Additional Equipment: Arterial line, TEE, CVP and Ultrasound Guidance Line Placement  Intra-op Plan:   Post-operative Plan: Post-operative intubation/ventilation  Informed Consent: I have reviewed the patients History and Physical, chart, labs and discussed the procedure including the risks, benefits and alternatives for the proposed anesthesia with the patient or authorized representative who has indicated his/her understanding and acceptance.     Dental advisory given  Plan Discussed with: CRNA  Anesthesia Plan Comments:        Anesthesia Quick Evaluation

## 2022-09-20 NOTE — Interval H&P Note (Signed)
History and Physical Interval Note:  09/20/2022 6:59 AM  Sheila Keller  has presented today for surgery, with the diagnosis of CAD AS.  The various methods of treatment have been discussed with the patient and family. After consideration of risks, benefits and other options for treatment, the patient has consented to  Procedure(s): CORONARY ARTERY BYPASS GRAFTING (CABG) (N/A) possible AORTIC VALVE REPLACEMENT (AVR) (N/A) TRANSESOPHAGEAL ECHOCARDIOGRAM (TEE) (N/A) as a surgical intervention.  The patient's history has been reviewed, patient examined, no change in status, stable for surgery.  I have reviewed the patient's chart and labs.  Questions will br answered to the patient's satisfaction.     Pierre Bali Kaydon Husby

## 2022-09-20 NOTE — Anesthesia Procedure Notes (Signed)
Procedure Name: Intubation Date/Time: 09/20/2022 8:04 AM  Performed by: Harden Mo, CRNAPre-anesthesia Checklist: Patient identified, Emergency Drugs available, Suction available and Patient being monitored Patient Re-evaluated:Patient Re-evaluated prior to induction Oxygen Delivery Method: Circle System Utilized Preoxygenation: Pre-oxygenation with 100% oxygen Induction Type: IV induction Ventilation: Mask ventilation without difficulty Laryngoscope Size: Miller and 2 Grade View: Grade II Tube type: Oral Tube size: 8.0 mm Number of attempts: 1 Airway Equipment and Method: Stylet and Oral airway Placement Confirmation: ETT inserted through vocal cords under direct vision, positive ETCO2 and breath sounds checked- equal and bilateral Secured at: 22 cm Tube secured with: Tape Dental Injury: Teeth and Oropharynx as per pre-operative assessment

## 2022-09-20 NOTE — Anesthesia Postprocedure Evaluation (Signed)
Anesthesia Post Note  Patient: Sheila Keller  Procedure(s) Performed: CORONARY ARTERY BYPASS GRAFTING (CABG) X3 USING ENDOSCOPICALLY HARVESTED RIGHT GREATER SAPHENOUS VEIN (Chest) TRANSESOPHAGEAL ECHOCARDIOGRAM (TEE)     Patient location during evaluation: SICU Anesthesia Type: General Level of consciousness: sedated Pain management: pain level controlled Vital Signs Assessment: post-procedure vital signs reviewed and stable Respiratory status: patient remains intubated per anesthesia plan Cardiovascular status: stable Postop Assessment: no apparent nausea or vomiting Anesthetic complications: no   No notable events documented.  Last Vitals:  Vitals:   09/20/22 1500 09/20/22 1505  BP: 106/85   Pulse: (!) 104 (!) 103  Resp: 19 (!) 22  Temp: 36.9 C 36.9 C  SpO2: 100% 100%    Last Pain:  Vitals:   09/20/22 0602  TempSrc: Oral                 Elisa Kutner P Safina Huard

## 2022-09-20 NOTE — Transfer of Care (Signed)
Immediate Anesthesia Transfer of Care Note  Patient: Sheila Keller  Procedure(s) Performed: CORONARY ARTERY BYPASS GRAFTING (CABG) X3 USING ENDOSCOPICALLY HARVESTED RIGHT GREATER SAPHENOUS VEIN (Chest) TRANSESOPHAGEAL ECHOCARDIOGRAM (TEE)  Patient Location: ICU  Anesthesia Type:General  Level of Consciousness: sedated and unresponsive  Airway & Oxygen Therapy: Patient remains intubated per anesthesia plan and Patient placed on Ventilator (see vital sign flow sheet for setting)  Post-op Assessment: Report given to RN and Post -op Vital signs reviewed and stable  Post vital signs: Reviewed and stable  Last Vitals:  Vitals Value Taken Time  BP 137/100 09/20/22 1419  Temp 36.9 C 09/20/22 1425  Pulse 109 09/20/22 1425  Resp 12 09/20/22 1425  SpO2 100 % 09/20/22 1425  Vitals shown include unvalidated device data.  Last Pain:  Vitals:   09/20/22 0602  TempSrc: Oral         Complications: No notable events documented.

## 2022-09-21 ENCOUNTER — Encounter (HOSPITAL_COMMUNITY): Payer: Self-pay | Admitting: Cardiothoracic Surgery

## 2022-09-21 ENCOUNTER — Inpatient Hospital Stay (HOSPITAL_COMMUNITY): Payer: Medicare Other

## 2022-09-21 LAB — BASIC METABOLIC PANEL
Anion gap: 6 (ref 5–15)
Anion gap: 6 (ref 5–15)
BUN: 14 mg/dL (ref 8–23)
BUN: 12 mg/dL (ref 8–23)
CO2: 20 mmol/L — ABNORMAL LOW (ref 22–32)
CO2: 20 mmol/L — ABNORMAL LOW (ref 22–32)
Calcium: 6.9 mg/dL — ABNORMAL LOW (ref 8.9–10.3)
Calcium: 7.2 mg/dL — ABNORMAL LOW (ref 8.9–10.3)
Chloride: 113 mmol/L — ABNORMAL HIGH (ref 98–111)
Chloride: 112 mmol/L — ABNORMAL HIGH (ref 98–111)
Creatinine, Ser: 0.53 mg/dL (ref 0.44–1.00)
Creatinine, Ser: 0.58 mg/dL (ref 0.44–1.00)
GFR, Estimated: >60 mL/min (ref >60)
GFR, Estimated: >60 mL/min (ref >60)
Glucose, Bld: 141 mg/dL — ABNORMAL HIGH (ref 70–99)
Glucose, Bld: 106 mg/dL — ABNORMAL HIGH (ref 70–99)
Potassium: 3.8 mmol/L (ref 3.5–5.1)
Potassium: 3.7 mmol/L (ref 3.5–5.1)
Sodium: 139 mmol/L (ref 135–145)
Sodium: 138 mmol/L (ref 135–145)

## 2022-09-21 LAB — GLUCOSE, CAPILLARY
Glucose-Capillary: 106 mg/dL — ABNORMAL HIGH (ref 70–99)
Glucose-Capillary: 110 mg/dL — ABNORMAL HIGH (ref 70–99)
Glucose-Capillary: 112 mg/dL — ABNORMAL HIGH (ref 70–99)
Glucose-Capillary: 114 mg/dL — ABNORMAL HIGH (ref 70–99)
Glucose-Capillary: 115 mg/dL — ABNORMAL HIGH (ref 70–99)
Glucose-Capillary: 119 mg/dL — ABNORMAL HIGH (ref 70–99)
Glucose-Capillary: 123 mg/dL — ABNORMAL HIGH (ref 70–99)
Glucose-Capillary: 126 mg/dL — ABNORMAL HIGH (ref 70–99)
Glucose-Capillary: 143 mg/dL — ABNORMAL HIGH (ref 70–99)
Glucose-Capillary: 144 mg/dL — ABNORMAL HIGH (ref 70–99)
Glucose-Capillary: 144 mg/dL — ABNORMAL HIGH (ref 70–99)
Glucose-Capillary: 146 mg/dL — ABNORMAL HIGH (ref 70–99)
Glucose-Capillary: 147 mg/dL — ABNORMAL HIGH (ref 70–99)
Glucose-Capillary: 152 mg/dL — ABNORMAL HIGH (ref 70–99)
Glucose-Capillary: 154 mg/dL — ABNORMAL HIGH (ref 70–99)
Glucose-Capillary: 160 mg/dL — ABNORMAL HIGH (ref 70–99)
Glucose-Capillary: 165 mg/dL — ABNORMAL HIGH (ref 70–99)
Glucose-Capillary: 202 mg/dL — ABNORMAL HIGH (ref 70–99)
Glucose-Capillary: 203 mg/dL — ABNORMAL HIGH (ref 70–99)
Glucose-Capillary: 73 mg/dL (ref 70–99)

## 2022-09-21 LAB — CBC
HCT: 27.2 % — ABNORMAL LOW (ref 36.0–46.0)
HCT: 25.5 % — ABNORMAL LOW (ref 36.0–46.0)
Hemoglobin: 9 g/dL — ABNORMAL LOW (ref 12.0–15.0)
Hemoglobin: 8.6 g/dL — ABNORMAL LOW (ref 12.0–15.0)
MCH: 30.1 pg (ref 26.0–34.0)
MCH: 30.7 pg (ref 26.0–34.0)
MCHC: 33.1 g/dL (ref 30.0–36.0)
MCHC: 33.7 g/dL (ref 30.0–36.0)
MCV: 91 fL (ref 80.0–100.0)
MCV: 91.1 fL (ref 80.0–100.0)
Platelets: 79 10*3/uL — ABNORMAL LOW (ref 150–400)
Platelets: 85 10*3/uL — ABNORMAL LOW (ref 150–400)
RBC: 2.99 MIL/uL — ABNORMAL LOW (ref 3.87–5.11)
RBC: 2.8 MIL/uL — ABNORMAL LOW (ref 3.87–5.11)
RDW: 14.3 % (ref 11.5–15.5)
RDW: 14.3 % (ref 11.5–15.5)
WBC: 6.6 10*3/uL (ref 4.0–10.5)
WBC: 8.8 10*3/uL (ref 4.0–10.5)
nRBC: 0 % (ref 0.0–0.2)
nRBC: 0 % (ref 0.0–0.2)

## 2022-09-21 LAB — MAGNESIUM
Magnesium: 2.3 mg/dL (ref 1.7–2.4)
Magnesium: 2.1 mg/dL (ref 1.7–2.4)

## 2022-09-21 MED ORDER — FUROSEMIDE 10 MG/ML IJ SOLN
10.0000 mg | Freq: Once | INTRAMUSCULAR | Status: AC
  Administered 2022-09-21: 10 mg via INTRAVENOUS
  Filled 2022-09-21: qty 2

## 2022-09-21 NOTE — Progress Notes (Signed)
     [address]       [place] [address]             [phone]       EVENING ROUNDS POD#1 CABG Off all infusions Good urine output Hemodynamics ok CT out tomorrow

## 2022-09-21 NOTE — Progress Notes (Addendum)
S: Talkative, conversational at bedside No overnight events Minimal discomfort Minimal CT output  O: Vitals:   09/21/22 0700 09/21/22 0800  BP: 130/79 (!) 141/94  Pulse: (!) 104 (!) 111  Resp: 18 18  Temp: 98.6 F (37 C) 99 F (37.2 C)  SpO2: 97% 98%    Sinus, no gtts.   CXR reviewed, small left pleural effusion  Cr and WC normal CBC    Component Value Date/Time   WBC 6.6 09/21/2022 0414   RBC 2.99 (L) 09/21/2022 0414   HGB 9.0 (L) 09/21/2022 0414   HGB 12.7 08/26/2022 1448   HCT 27.2 (L) 09/21/2022 0414   HCT 38.1 08/26/2022 1448   PLT 79 (L) 09/21/2022 0414   PLT 227 08/26/2022 1448   MCV 91.0 09/21/2022 0414   MCV 91 08/26/2022 1448   MCH 30.1 09/21/2022 0414   MCHC 33.1 09/21/2022 0414   RDW 14.3 09/21/2022 0414   RDW 13.0 08/26/2022 1448   LYMPHSABS 1.6 06/22/2013 0925   MONOABS 0.4 06/22/2013 0925   EOSABS 0.5 06/22/2013 0925   BASOSABS 0.0 06/22/2013 0925      Latest Ref Rng & Units 09/21/2022    4:14 AM 09/20/2022    9:07 PM 09/20/2022    8:00 PM  BMP  Glucose 70 - 99 mg/dL 141  198    BUN 8 - 23 mg/dL 14  15    Creatinine 0.44 - 1.00 mg/dL 0.53  0.54    Sodium 135 - 145 mmol/L 139  140  144   Potassium 3.5 - 5.1 mmol/L 3.8  4.1  4.1   Chloride 98 - 111 mmol/L 113  114    CO2 22 - 32 mmol/L 20  21    Calcium 8.9 - 10.3 mg/dL 6.9  6.8        A/P: 31F POD 1 CABG x 3. Residual moderate AS.  Doing well.  DC swan Will review CT output later today for possible removal. Leave blake to bulb in place.  10 IV lasix this AM

## 2022-09-21 NOTE — Progress Notes (Signed)
  Transition of Care [facility]) Screening Note   Patient Details  Name: Sheila Keller Date of Birth: [birth year]   Transition of Care [facility]) CM/SW Contact:    Milas Gain, [place] Phone Number: 09/21/2022, 4:51 PM    Transition of Care Department [facility]) has reviewed patient and no TOC needs have been identified at this time. We will continue to monitor patient advancement through interdisciplinary progression rounds. If new patient transition needs arise, please place a TOC consult.

## 2022-09-22 LAB — CBC
HCT: 26.8 % — ABNORMAL LOW (ref 36.0–46.0)
Hemoglobin: 8.7 g/dL — ABNORMAL LOW (ref 12.0–15.0)
MCH: 30.4 pg (ref 26.0–34.0)
MCHC: 32.5 g/dL (ref 30.0–36.0)
MCV: 93.7 fL (ref 80.0–100.0)
Platelets: 85 10*3/uL — ABNORMAL LOW (ref 150–400)
RBC: 2.86 MIL/uL — ABNORMAL LOW (ref 3.87–5.11)
RDW: 14.4 % (ref 11.5–15.5)
WBC: 7.8 10*3/uL (ref 4.0–10.5)
nRBC: 0 % (ref 0.0–0.2)

## 2022-09-22 LAB — BASIC METABOLIC PANEL
Anion gap: 7 (ref 5–15)
BUN: 12 mg/dL (ref 8–23)
CO2: 21 mmol/L — ABNORMAL LOW (ref 22–32)
Calcium: 7.7 mg/dL — ABNORMAL LOW (ref 8.9–10.3)
Chloride: 112 mmol/L — ABNORMAL HIGH (ref 98–111)
Creatinine, Ser: 0.56 mg/dL (ref 0.44–1.00)
GFR, Estimated: >60 mL/min (ref >60)
Glucose, Bld: 119 mg/dL — ABNORMAL HIGH (ref 70–99)
Potassium: 3.7 mmol/L (ref 3.5–5.1)
Sodium: 140 mmol/L (ref 135–145)

## 2022-09-22 LAB — GLUCOSE, CAPILLARY
Glucose-Capillary: 104 mg/dL — ABNORMAL HIGH (ref 70–99)
Glucose-Capillary: 104 mg/dL — ABNORMAL HIGH (ref 70–99)
Glucose-Capillary: 112 mg/dL — ABNORMAL HIGH (ref 70–99)
Glucose-Capillary: 120 mg/dL — ABNORMAL HIGH (ref 70–99)
Glucose-Capillary: 123 mg/dL — ABNORMAL HIGH (ref 70–99)
Glucose-Capillary: 127 mg/dL — ABNORMAL HIGH (ref 70–99)
Glucose-Capillary: 138 mg/dL — ABNORMAL HIGH (ref 70–99)
Glucose-Capillary: 155 mg/dL — ABNORMAL HIGH (ref 70–99)
Glucose-Capillary: 163 mg/dL — ABNORMAL HIGH (ref 70–99)
Glucose-Capillary: 184 mg/dL — ABNORMAL HIGH (ref 70–99)
Glucose-Capillary: 187 mg/dL — ABNORMAL HIGH (ref 70–99)
Glucose-Capillary: 190 mg/dL — ABNORMAL HIGH (ref 70–99)
Glucose-Capillary: 89 mg/dL (ref 70–99)

## 2022-09-22 MED ORDER — POTASSIUM CHLORIDE CRYS ER 20 MEQ PO TBCR
20.0000 meq | EXTENDED_RELEASE_TABLET | Freq: Once | ORAL | Status: AC
  Administered 2022-09-22: 20 meq via ORAL
  Filled 2022-09-22: qty 1

## 2022-09-22 MED ORDER — INSULIN ASPART 100 UNIT/ML IJ SOLN
0.0000 [IU] | Freq: Three times a day (TID) | INTRAMUSCULAR | Status: DC
  Administered 2022-09-22 (×2): 3 [IU] via SUBCUTANEOUS
  Administered 2022-09-23: 2 [IU] via SUBCUTANEOUS
  Administered 2022-09-23 – 2022-09-24 (×3): 3 [IU] via SUBCUTANEOUS

## 2022-09-22 MED ORDER — INSULIN DETEMIR 100 UNIT/ML ~~LOC~~ SOLN
8.0000 [IU] | Freq: Two times a day (BID) | SUBCUTANEOUS | Status: DC
  Administered 2022-09-22 – 2022-09-23 (×3): 8 [IU] via SUBCUTANEOUS
  Filled 2022-09-22 (×6): qty 0.08

## 2022-09-22 MED ORDER — INSULIN ASPART 100 UNIT/ML IJ SOLN: 0.0000 [IU] | Freq: Every day | INTRAMUSCULAR | Status: DC

## 2022-09-22 NOTE — TOC Initial Note (Addendum)
Transition of Care [facility]) - Initial/Assessment Note    Patient Details  Name: Sheila Keller MRN: [id number] Date of Birth: 1946/10/12  Transition of Care [facility]) CM/SW Contact:    Bethena Roys, RN Phone Number: 09/22/2022, 12:33 PM  Clinical Narrative:  Patient POD-2 CABG. PTA patient reports that she was independent from home alone. Patient states she has two sons; however, they will not be able to take care of her post hospitalization. Patient states she wants to see if she can go to [facility] vs [facility]. Case Manager did ask the staff to place PT/OT orders in for recommendations. Case Manager will continue to follow for transition of care needs as the patient progresses.                  Expected Discharge Plan: IP Rehab Facility Barriers to Discharge: Continued Medical Work up   Patient Goals and CMS Choice Patient states their goals for this hospitalization and ongoing recovery are:: Patient states she cannot support herself home alone wants either CIR vs [facility] SNF at d/c.      Expected Discharge Plan and Services Expected Discharge Plan: [place] In-house Referral: NA Discharge Planning Services: CM Consult Post Acute Care Choice: IP Rehab Living arrangements for the past 2 months: Single Family Home                   DME Agency: NA   Prior Living Arrangements/Services Living arrangements for the past 2 months: Single Family Home Lives with:: Self Patient language and need for interpreter reviewed:: Yes Do you feel safe going back to the place where you live?: No   states she will need additional help  Need for Family Participation in Patient Care: Yes (Comment) Care giver support system in place?: Yes (comment)   Criminal Activity/Legal Involvement Pertinent to Current Situation/Hospitalization: No - Comment as needed   Permission Sought/Granted Permission sought to share information with : Case Manager, Family Supports (Has 2 sons)      Emotional Assessment Appearance:: Appears stated age Attitude/Demeanor/Rapport: Engaged Affect (typically observed): Appropriate Orientation: : Oriented to Self, Oriented to Place, Oriented to  Time, Oriented to Situation Alcohol / Substance Use: Not Applicable Psych Involvement: No (comment)  Admission diagnosis:  S/P CABG x 3 [Z95.1] Patient Active Problem List   Diagnosis Date Noted   S/P CABG x 3 09/20/2022   CAD (coronary artery disease) 09/09/2022   Osteopenia 01/14/2016   Intramural leiomyoma of uterus 09/30/2014   DM II (diabetes mellitus, type II), controlled ([place]) 09/19/2013   Hypertension    Elevated cholesterol    PCP:  Maury Dus, MD Pharmacy:   [id number] [id number]: [phone]: [phone]  Readmission Risk Interventions     No data to display

## 2022-09-22 NOTE — Progress Notes (Signed)
CT surgery PM Rounds  Patient in good spirits- walked in hall Nsr but HR increasing, will get lopressor 25 mg this pm No new problems  Blood pressure 133/68, pulse (!) 102, temperature 97.9 F (36.6 C), temperature source Oral, resp. rate (!) 30, height '5\' 3"'$  (1.6 m), weight 75.5 kg, SpO2 95 %.

## 2022-09-22 NOTE — Inpatient Diabetes Management (Signed)
Inpatient Diabetes Program Recommendations  AACE/ADA: New Consensus Statement on Inpatient Glycemic Control (2015)  Target Ranges:  Prepandial:   less than 140 mg/dL      Peak postprandial:   less than 180 mg/dL (1-2 hours)      Critically ill patients:  140 - 180 mg/dL   Lab Results  Component Value Date   GLUCAP 187 (H) 09/22/2022   HGBA1C 7.0 (H) 09/16/2022    Diabetes history: DM2 Outpatient Diabetes medications: Januvia 100 qd, jardiance 25, Met 1 gm bid Current orders for Inpatient glycemic control: IV insulin transitioning to Novolog 0-15 units tid, 0-5 units hs  Inpatient Diabetes Program Recommendations:   Please consider: -Levemir 8 units bid (0.2 units/kg x 75.5 kg = 15 units)  Thank you, Bethena Roys E. Mellany Dinsmore, RN, MSN, CDE  Diabetes Coordinator Inpatient Glycemic Control Team Team Pager [phone] (8am-5pm) 09/22/2022 1:30 PM

## 2022-09-22 NOTE — Discharge Summary (Signed)
[address]       [place] [address]             [phone]    Physician Discharge Summary  Patient ID: Sheila Keller MRN: [id number] DOB/AGE: 07/07/1946 76 y.o.  Admit date: 09/20/2022 Discharge date: 09/27/2022  Admission Diagnoses:  Patient Active Problem List   Diagnosis Date Noted   S/P CABG x 3 09/20/2022   CAD (coronary artery disease) 09/09/2022   Osteopenia 01/14/2016   Intramural leiomyoma of uterus 09/30/2014   DM II (diabetes mellitus, type II), controlled ([place]) 09/19/2013   Hypertension    Elevated cholesterol      Discharge Diagnoses:  Patient Active Problem List   Diagnosis Date Noted   S/P CABG x 3 09/20/2022   CAD (coronary artery disease) 09/09/2022   Osteopenia 01/14/2016   Intramural leiomyoma of uterus 09/30/2014   DM II (diabetes mellitus, type II), controlled ([place]) 09/19/2013   Hypertension    Elevated cholesterol      Discharged Condition: stable   Referring: Sheila Crome, MD Primary Care: Sheila Dus, MD Primary Cardiologist: Sheila Heinz, MD      History of Present Illness:    At time of CT surgical consultation Sheila Keller is a 76 y.o. female with a hx of aortic stenosis, hypertension, diabetes, hyperlipidemia who presents for CT surgical evaluation of left main coronary disease and aortic stenosis.  She has been followed for AS for years and recently was diagnosed with severe left main disease. Further history below:   She was referred to Dr. Gardiner Keller  for evaluation of heart murmur.  At that time, she denied any chest pain, dyspnea, palpitations, LE edema, lightheadeness, or syncope.  For exercise she walks around yard for 20 minutes.  She denied any exertional symptoms.  Never smoked.  Mother had MI in 50s.   TTE on 03/05/2020 showed hyperdynamic LV systolic function, mild concentric LVH, grade 1 diastolic dysfunction, normal RV function, normal PASP, mild to moderate aortic stenosis.   Echocardiogram 03/17/2021 showed LVEF 70 to 24%, grade 1 diastolic dysfunction, normal RV function, moderate aortic stenosis.  Echocardiogram 03/30/2022 showed EF 70 to 58%, grade 1 diastolic dysfunction, LV mid cavitary gradient 22 mmHg (43 mmHg with Valsalva), normal RV function, moderate aortic stenosis (V-max 3 m/s, mean gradient 24 mmHg, AVA 0.8 cm, DI 0.34).   She underwent a CT calcium scoring on 05/25/22 for a total score of 1176. This was 95 percentile for age-, race-, and sex-matched controls.   She then underwent that was consistent with ischemia on 08/24/22, leading to a LHC on 9/27 which demonstrated severe LM disease. LAD with another 30% lesion and normal CX and RCA. LV hyperdynamic with normal LVEDP.   Following review of the patient's studies and evaluation of Sheila Keller it was Dr. Arleta Keller recommendation to proceed with coronary artery bypass grafting.  She was admitted electively for the procedure on 09/20/2022.   Hospital course:  Patient was admitted electively and on 09/20/2022 taken to the operating room at which time she underwent coronary artery bypass grafting x3.  Underwent the following grafts: LIMA-LAD, SVG-OM1, SVG-OM 2.  She tolerated the procedure well was taken the surgical intensive care unit in stable condition.  Postoperative hospital course:   Patient has remained hemodynamically stable.  He was extubated using standard post cardiac surgical protocols without difficulty.  Swan-Ganz was removed on postop day #1. She is noted to have a moderate expected  acute blood loss anemia which is stabilized.  She had a postoperative thrombocytopenia with improving trend prior to discharge. She had no bleeding complications. She was transferred to the progressive care unit where progress continued.  Diet and activity were advanced and well tolerated.  She was evaluated by the PT and OT staff  and was felt whe would benefit from further rehab at a SNF before attempting to  return home where she was living independently prior to admission. She agreed with this plan.  She is discharge in stable condition. Incisions are healing with no sign of complication. Sh is in stable sinus rhythm.    Consults: cardiology  Significant Diagnostic Studies:  CLINICAL DATA:  Status post coronary bypass surgery   EXAM: PORTABLE CHEST 1 VIEW   COMPARISON:  Previous studies including the examination of 09/20/2022   FINDINGS: There is interval removal of endotracheal tube and enteric tube. Tip of Swan-Ganz catheter is noted in the proximal course of right main pulmonary artery. Mediastinal drain is noted. Left chest tube is noted. Increased density in left lower lung fields suggest pleural effusion and possibly atelectasis. Subsegmental atelectasis is seen in the medial right lower lung fields. There is no pneumothorax.   IMPRESSION: Cardiomegaly. There are no signs of pulmonary edema. Increased density in left lower lung fields suggests pleural effusion and atelectasis. Subsegmental atelectasis is seen in the medial right lower lung field.   Support devices as described in the body of the report.     Electronically Signed   By: Sheila Keller M.D.   On: 09/21/2022 08:12  Treatments: surgery: Surgery  Operative Note: Patient Name: Sheila Keller Date of Birth: 03/21/1946 Operation: CABG x 3 (LIMA-LAD, SVG - OM1, SVG - OM2)    Indications: This is a 76 y.o.-year-old female with a history of aortic stenosis, hypertension, diabetes, hyperlipidemia who presents for CT surgical evaluation of left main coronary disease and aortic stenosis. We had a Friday meeting morning about here as well where it was discussed that her pressing issue is severe left main, and the moderate AS could be TAVR later. She in particular has a small aortic annulus and would likely have better hemodynamics with a TAVR later, if needed. I discussed medical and surgical options with the  patient, a family member, and a friend who used to be a trauma nurse.  Risks/benefits/alternatives discussed including 1-2% mortality, 5% morbidity, and >93% standard recovery. All questions asked and answered.    Times:  CCX 80 min CPB 138 min   Assistant: Jadene Pierini, [place]   Findings:  Adequate conduits and targets LAD 2.915m  OM1 1.543mOM2 2.15m1115mIMA graft large with excellent flow Saphenous Vein 3mm74mverage quality.  Overall tissue quality worse than stated age of 76. 53Pre-bypass TEE -  EF near normal Re-reviewed aortic valve: mean gradient of 22. Moderate AS.   Post-bypass TEE -  Similar EF Operative Note: 09/20/2022 CABG x 3 (LIMA-LAD, SVG - OM1, SVG - OM2)   Discharge Exam: Blood pressure 108/65, pulse 95, temperature 98 F (36.7 C), temperature source Oral, resp. rate 20, height 5' 3"  (1.6 m), weight 69 kg, SpO2 96 %.  General appearance: alert, cooperative, and no distress Heart: regular rate and rhythm  Lungs: Breath sounds are clear, good sats on RA.  Abdomen: soft, NT. Extremities: No edema Wound: sternal and right leg incisions healing well without evidence of infection   Discharge Medications:  The patient has been discharged on:  1.Beta Blocker:  Yes [ x  ]                              No   [   ]                              If No, reason:  2.Ace Inhibitor/ARB: Yes [   ]                                     No  [ x   ]                                     If No, reason: soft BP  3.Statin:   Yes [ x  ]                  No  [   ]                  If No, reason:  4.Ecasa:  Yes  [ x  ]                  No   [   ]                  If No, reason:  Patient had ACS upon admission: No  Plavix/P2Y12 inhibitor: Yes [   ]                                      No  [ x  ]     Discharge Instructions     Amb Referral to Cardiac Rehabilitation   Complete by: As directed    Diagnosis: CABG   CABG X ___: 3   After initial evaluation and assessments  completed: Virtual Based Care may be provided alone or in conjunction with Phase 2 Cardiac Rehab based on patient barriers.: Yes   Intensive Cardiac Rehabilitation (ICR) [place] location only OR Traditional Cardiac Rehabilitation (TCR) *If criteria for ICR are not met will enroll in TCR [facility] only): Yes      Allergies as of 09/27/2022       Reactions   Bee Venom Hives   Farxiga [dapagliflozin] Other (See Comments)   Pt is unaware of allergy or reaction    Penicillins    Has patient had a PCN reaction causing immediate rash, facial/tongue/throat swelling, SOB or lightheadedness with hypotension: Yes Has patient had a PCN reaction causing severe rash involving mucus membranes or skin necrosis: Yes Has patient had a PCN reaction that required hospitalization No Has patient had a PCN reaction occurring within the last 10 years: No If all of the above answers are "NO", then may proceed with Cephalosporin use.        Medication List     STOP taking these medications    ALPRAZolam 0.5 MG tablet Commonly known as: XANAX   losartan 100 MG tablet Commonly known as: COZAAR       TAKE these medications    aspirin EC 325 MG tablet Take 1 tablet (325 mg total) by mouth daily. Start taking  on: September 28, 2022 What changed:  medication strength how much to take when to take this   atorvastatin 80 MG tablet Commonly known as: LIPITOR Take 1 tablet (80 mg total) by mouth daily.   Jardiance 25 MG Tabs tablet Generic drug: empagliflozin Take 25 mg by mouth daily.   metFORMIN 500 MG tablet Commonly known as: GLUCOPHAGE Take 1,000 mg by mouth 2 (two) times daily with a meal.   metoprolol tartrate 25 MG tablet Commonly known as: LOPRESSOR Take 1 tablet (25 mg total) by mouth 2 (two) times daily.   OneTouch Verio test strip Generic drug: glucose blood for use when checking blood sugars   risedronate 150 MG tablet Commonly known as: ACTONEL PLEASE SEE ATTACHED FOR DETAILED  DIRECTIONS What changed:  how much to take how to take this when to take this additional instructions   sitaGLIPtin 100 MG tablet Commonly known as: JANUVIA Take 100 mg by mouth daily.   traMADol 50 MG tablet Commonly known as: ULTRAM Take 1 tablet (50 mg total) by mouth every 6 (six) hours as needed for up to 5 days for moderate pain.   Vitamin D3 50 MCG (2000 UT) Tabs Take 2,000 Units by mouth every morning.        Follow-up Information     Enter, Pierre Bali, MD. Go on 10/07/2022.   Specialties: Cardiothoracic Surgery, Cardiology Why: Your appointment is at 12 noon. Please go to [facility]. 1 hour prior to the appointment for a chest x-ray by [employer]. Contact information: [address] [place] [address] [phone]         [employer] Follow up.   Why: Please obtain a chest x-ray at [facility] 1 hour prior to your appointment with Dr. Tenny Craw. Contact information: [place]                Signed:  Antony Odea, PA-C  09/27/2022, 1:20 PM

## 2022-09-22 NOTE — Hospital Course (Addendum)
  Referring: Belva Crome, MD Primary Care: Maury Dus, MD Primary Cardiologist: Donato Heinz, MD      History of Present Illness:    At time of CT surgical consultation Sheila Keller is a 76 y.o. female with a hx of aortic stenosis, hypertension, diabetes, hyperlipidemia who presents for CT surgical evaluation of left main coronary disease and aortic stenosis.  She has been followed for AS for years and recently was diagnosed with severe left main disease. Further history below:   She was referred to Dr. Gardiner Rhyme  for evaluation of heart murmur.  At that time, she denied any chest pain, dyspnea, palpitations, LE edema, lightheadeness, or syncope.  For exercise she walks around yard for 20 minutes.  She denied any exertional symptoms.  Never smoked.  Mother had MI in 21s.   TTE on 03/05/2020 showed hyperdynamic LV systolic function, mild concentric LVH, grade 1 diastolic dysfunction, normal RV function, normal PASP, mild to moderate aortic stenosis.  Echocardiogram 03/17/2021 showed LVEF 70 to 48%, grade 1 diastolic dysfunction, normal RV function, moderate aortic stenosis.  Echocardiogram 03/30/2022 showed EF 70 to 27%, grade 1 diastolic dysfunction, LV mid cavitary gradient 22 mmHg (43 mmHg with Valsalva), normal RV function, moderate aortic stenosis (V-max 3 m/s, mean gradient 24 mmHg, AVA 0.8 cm, DI 0.34).   She underwent a CT calcium scoring on 05/25/22 for a total score of 1176. This was 95 percentile for age-, race-, and sex-matched controls.   She then underwent that was consistent with ischemia on 08/24/22, leading to a LHC on 9/27 which demonstrated severe LM disease. LAD with another 30% lesion and normal CX and RCA. LV hyperdynamic with normal LVEDP.   Following review of the patient's studies and evaluation of Sheila Keller it was Dr. Arleta [place] recommendation to proceed with coronary artery bypass grafting.  She was admitted electively for the procedure on  09/20/2022.   Hospital course:  Patient was admitted electively and on 09/20/2022 taken to the operating room at which time she underwent coronary artery bypass grafting x3.  Underwent the following grafts: LIMA-LAD, SVG-OM1, SVG-OM 2.  She tolerated the procedure well was taken the surgical intensive care unit in stable condition.  Postoperative hospital course:   Patient has remained hemodynamically stable.  He was extubated using standard post cardiac surgical protocols without difficulty.  Swan-Ganz was removed on postop day #1. She is noted to have a moderate expected acute blood loss anemia which is stabilized.  She has a postoperative thrombocytopenia with improving trend.  She has expected postoperative volume overload and is responding to diuretics.  She is tolerating gradually increasing activity using standard post cardiac surgical protocols but is felt to be a candidate for short-term skilled nursing facility placement for ongoing rehabilitation. TOC/social work Biochemist, clinical are involved in patient placemen as she has nobody at home to assist care short-term she will require for ongoing rehab.

## 2022-09-23 LAB — BASIC METABOLIC PANEL
Anion gap: 5 (ref 5–15)
BUN: 13 mg/dL (ref 8–23)
CO2: 22 mmol/L (ref 22–32)
Calcium: 7.6 mg/dL — ABNORMAL LOW (ref 8.9–10.3)
Chloride: 112 mmol/L — ABNORMAL HIGH (ref 98–111)
Creatinine, Ser: 0.63 mg/dL (ref 0.44–1.00)
GFR, Estimated: >60 mL/min (ref >60)
Glucose, Bld: 187 mg/dL — ABNORMAL HIGH (ref 70–99)
Potassium: 4.1 mmol/L (ref 3.5–5.1)
Sodium: 139 mmol/L (ref 135–145)

## 2022-09-23 LAB — GLUCOSE, CAPILLARY
Glucose-Capillary: 178 mg/dL — ABNORMAL HIGH (ref 70–99)
Glucose-Capillary: 158 mg/dL — ABNORMAL HIGH (ref 70–99)
Glucose-Capillary: 148 mg/dL — ABNORMAL HIGH (ref 70–99)
Glucose-Capillary: 154 mg/dL — ABNORMAL HIGH (ref 70–99)

## 2022-09-23 MED ORDER — ORAL CARE MOUTH RINSE: 15.0000 mL | OROMUCOSAL | Status: DC | PRN

## 2022-09-23 MED ORDER — METOPROLOL TARTRATE 25 MG PO TABS
25.0000 mg | ORAL_TABLET | Freq: Two times a day (BID) | ORAL | Status: DC
  Administered 2022-09-23 – 2022-09-27 (×9): 25 mg via ORAL
  Filled 2022-09-23 (×9): qty 1

## 2022-09-23 MED ORDER — FUROSEMIDE 40 MG PO TABS
40.0000 mg | ORAL_TABLET | Freq: Every day | ORAL | Status: DC
  Administered 2022-09-23: 40 mg via ORAL
  Filled 2022-09-23: qty 1

## 2022-09-23 MED ORDER — METOPROLOL TARTRATE 25 MG/10 ML ORAL SUSPENSION
12.5000 mg | Freq: Two times a day (BID) | ORAL | Status: DC
  Filled 2022-09-23 (×3): qty 5

## 2022-09-23 MED ORDER — POTASSIUM CHLORIDE CRYS ER 20 MEQ PO TBCR
20.0000 meq | EXTENDED_RELEASE_TABLET | Freq: Every day | ORAL | Status: DC
  Administered 2022-09-23 – 2022-09-24 (×2): 20 meq via ORAL
  Filled 2022-09-23 (×2): qty 1

## 2022-09-23 MED FILL — Mannitol IV Soln 20%: INTRAVENOUS | Qty: 500 | Status: AC

## 2022-09-23 MED FILL — Electrolyte-R (PH 7.4) Solution: INTRAVENOUS | Qty: 5000 | Status: AC

## 2022-09-23 MED FILL — Potassium Chloride Inj 2 mEq/ML: INTRAVENOUS | Qty: 40 | Status: AC

## 2022-09-23 MED FILL — Sodium Chloride IV Soln 0.9%: INTRAVENOUS | Qty: 2000 | Status: AC

## 2022-09-23 MED FILL — Lidocaine HCl Local Preservative Free (PF) Inj 2%: INTRAMUSCULAR | Qty: 14 | Status: AC

## 2022-09-23 MED FILL — Sodium Bicarbonate IV Soln 8.4%: INTRAVENOUS | Qty: 50 | Status: AC

## 2022-09-23 MED FILL — Heparin Sodium (Porcine) Inj 1000 Unit/ML: Qty: 1000 | Status: AC

## 2022-09-23 NOTE — Evaluation (Signed)
Physical Therapy Evaluation Patient Details Name: Sheila Keller MRN: [id number] DOB: 09-20-1946 Today's Date: 09/23/2022  History of Present Illness  76 y.o female admitted 10/16 for CABG x 3 same date. PMHx: AS, HTN, T2DM, HLD  Clinical Impression  Pt very pleasant and willing to mobilize. Pt walked at 330 am and returned to bed after additional gait during session. Pt lives alone with limited family support and would benefit from ST-SNF until able to be mod I with basic mobility and ADLs. Pt educated for sternal precautions with pt able to state 3/4 end of session. Pt with decreased strength, mobility and function who will benefit from acute therapy to maximize mobility and safety.   BP pre gait 114/82 Post gait 138/85 HR 115-132 during gait SPO2 97% on RA        Recommendations for follow up therapy are one component of a multi-disciplinary discharge planning process, led by the attending physician.  Recommendations may be updated based on patient status, additional functional criteria and insurance authorization.  Follow Up Recommendations Skilled nursing-short term rehab (<3 hours/day) Can patient physically be transported by private vehicle: Yes    Assistance Recommended at Discharge Intermittent Supervision/Assistance  Patient can return home with the following  A little help with walking and/or transfers;A little help with bathing/dressing/bathroom;Assistance with cooking/housework;Assist for transportation;Help with stairs or ramp for entrance    Equipment Recommendations Rolling walker (2 wheels)  Recommendations for Other Services  OT consult    Functional Status Assessment Patient has had a recent decline in their functional status and demonstrates the ability to make significant improvements in function in a reasonable and predictable amount of time.     Precautions / Restrictions Precautions Precautions: Sternal;Other (comment) Precaution Comments: jp  drain Restrictions Weight Bearing Restrictions: Yes RUE Weight Bearing: Partial weight bearing RUE Partial Weight Bearing Percentage or Pounds: 10 LUE Weight Bearing: Partial weight bearing LUE Partial Weight Bearing Percentage or Pounds: 10      Mobility  Bed Mobility Overal bed mobility: Needs Assistance Bed Mobility: Sit to Supine       Sit to supine: Min assist   General bed mobility comments: min assist to lift legs to surface with mod cues for sequence and precautions, mod assist to scoot up in bed in trendelenburg    Transfers Overall transfer level: Needs assistance   Transfers: Sit to/from Stand Sit to Stand: Min guard           General transfer comment: cues for hand placement to rise from chair and BSC    Ambulation/Gait Ambulation/Gait assistance: Min guard Gait Distance (Feet): 400 Feet Assistive device: Rolling walker (2 wheels) Gait Pattern/deviations: Step-through pattern, Decreased stride length   Gait velocity interpretation: <1.8 ft/sec, indicate of risk for recurrent falls   General Gait Details: cues for proximity to RW and direction  Stairs            Wheelchair Mobility    Modified Rankin (Stroke Patients Only)       Balance Overall balance assessment: Mild deficits observed, not formally tested                                           Pertinent Vitals/Pain Pain Assessment Pain Assessment: 0-10 Pain Score: 2  Pain Location: incision Pain Intervention(s): Limited activity within patient's tolerance, Monitored during session, Repositioned    Home Living Family/patient expects  to be discharged to:: Private residence Living Arrangements: Alone Available Help at Discharge: Family;Available PRN/intermittently Type of Home: House Home Access: Stairs to enter Entrance Stairs-Rails: None Entrance Stairs-Number of Steps: 2   Home Layout: One level Home Equipment: Shower seat - built in;Grab bars -  toilet      Prior Function Prior Level of Function : Independent/Modified Independent                     Hand Dominance        Extremity/Trunk Assessment   Upper Extremity Assessment Upper Extremity Assessment: Overall WFL for tasks assessed    Lower Extremity Assessment Lower Extremity Assessment: Overall WFL for tasks assessed    Cervical / Trunk Assessment Cervical / Trunk Assessment: Normal  Communication   Communication: No difficulties  Cognition Arousal/Alertness: Awake/alert Behavior During Therapy: WFL for tasks assessed/performed Overall Cognitive Status: Within Functional Limits for tasks assessed                                          General Comments      Exercises     Assessment/Plan    PT Assessment Patient needs continued PT services  PT Problem List Decreased coordination;Decreased activity tolerance;Decreased mobility       PT Treatment Interventions Gait training;DME instruction;Therapeutic exercise;Stair training;Functional mobility training;Therapeutic activities;Patient/family education    PT Goals (Current goals can be found in the Care Plan section)  Acute Rehab PT Goals Patient Stated Goal: return home, go out to eat PT Goal Formulation: With patient Time For Goal Achievement: 10/07/22 Potential to Achieve Goals: Good    Frequency Min 3X/week     Co-evaluation               AM-PAC PT "6 Clicks" Mobility  Outcome Measure Help needed turning from your back to your side while in a flat bed without using bedrails?: A Little Help needed moving from lying on your back to sitting on the side of a flat bed without using bedrails?: A Little Help needed moving to and from a bed to a chair (including a wheelchair)?: A Little Help needed standing up from a chair using your arms (e.g., wheelchair or bedside chair)?: A Little Help needed to walk in hospital room?: A Little Help needed climbing 3-5 steps with  a railing? : A Lot 6 Click Score: 17    End of Session   Activity Tolerance: Patient tolerated treatment well Patient left: in bed;with call bell/phone within reach Nurse Communication: Mobility status PT Visit Diagnosis: Other abnormalities of gait and mobility (R26.89)    Time: 1031-5945 PT Time Calculation (min) (ACUTE ONLY): 33 min   Charges:   PT Evaluation $PT Eval Moderate Complexity: 1 Mod PT Treatments $Gait Training: 8-22 mins        [place] Office: [place] 09/23/2022, 9:35 AM

## 2022-09-23 NOTE — Progress Notes (Signed)
[address]       [place] [address]             [phone]      3 Days Post-Op Procedure(s) (LRB): CORONARY ARTERY BYPASS GRAFTING (CABG) X3 USING ENDOSCOPICALLY HARVESTED RIGHT GREATER SAPHENOUS VEIN (N/A) TRANSESOPHAGEAL ECHOCARDIOGRAM (TEE) (N/A) Subjective: Feels   Objective: Vital signs in last 24 hours: Temp:  [97.8 F (36.6 C)-98.6 F (37 C)] 97.8 F (36.6 C) (10/19 0400) Pulse Rate:  [81-117] 95 (10/19 0600) Cardiac Rhythm: Sinus tachycardia (10/18 2000) Resp:  [16-30] 17 (10/19 0600) BP: (103-154)/(65-96) 117/79 (10/19 0600) SpO2:  [92 %-99 %] 93 % (10/19 0600) Arterial Line BP: (111-144)/(54-74) 119/56 (10/18 1300) Weight:  [75.4 kg] 75.4 kg (10/19 0416)  Hemodynamic parameters for last 24 hours:    Intake/Output from previous day: 10/18 0701 - 10/19 0700 In: 665.9 [P.O.:480; I.V.:185.9] Out: 1830 [Urine:1755; Drains:75] Intake/Output this shift: No intake/output data recorded.  General appearance: alert, cooperative, and no distress Heart: regular rate and rhythm Lungs: clear to auscultation bilaterally Abdomen: benign Extremities: no edema Wound: dressings intact  Lab Results: Recent Labs    09/21/22 2139 09/22/22 0349  WBC 8.8 7.8  HGB 8.6* 8.7*  HCT 25.5* 26.8*  PLT 85* 85*   BMET:  Recent Labs    09/22/22 0349 09/23/22 0303  NA 140 139  K 3.7 4.1  CL 112* 112*  CO2 21* 22  GLUCOSE 119* 187*  BUN 12 13  CREATININE 0.56 0.63  CALCIUM 7.7* 7.6*    PT/INR:  Recent Labs    09/20/22 1418  LABPROT 16.1*  INR 1.3*   ABG    Component Value Date/Time   PHART 7.321 (L) 09/20/2022 2000   HCO3 20.6 09/20/2022 2000   TCO2 22 09/20/2022 2000   ACIDBASEDEF 5.0 (H) 09/20/2022 2000   O2SAT 99 09/20/2022 2000   CBG (last 3)  Recent Labs    09/22/22 1655 09/22/22 2123 09/23/22 0639  GLUCAP 155* 190* 178*    Meds Scheduled Meds:  acetaminophen  1,000 mg Oral Q6H   Or   acetaminophen (TYLENOL) oral  liquid 160 mg/5 mL  1,000 mg Per Tube Q6H   aspirin EC  325 mg Oral Daily   Or   aspirin  324 mg Per Tube Daily   atorvastatin  80 mg Oral Daily   bisacodyl  10 mg Oral Daily   Or   bisacodyl  10 mg Rectal Daily   Chlorhexidine Gluconate Cloth  6 each Topical Daily   docusate sodium  200 mg Oral Daily   insulin aspart  0-15 Units Subcutaneous TID WC   insulin aspart  0-5 Units Subcutaneous QHS   insulin detemir  8 Units Subcutaneous BID   metoprolol tartrate  12.5 mg Oral BID   Or   metoprolol tartrate  12.5 mg Per Tube BID   mouth rinse  15 mL Mouth Rinse 4 times per day   pantoprazole  40 mg Oral Daily   sodium chloride flush  10-40 mL Intracatheter Q12H   sodium chloride flush  3 mL Intravenous Q12H   Continuous Infusions:  sodium chloride Stopped (09/21/22 0855)   sodium chloride     sodium chloride Stopped (09/22/22 1539)   dexmedetomidine (PRECEDEX) IV infusion Stopped (09/20/22 1708)   epinephrine Stopped (09/20/22 1441)   lactated ringers Stopped (09/20/22 1440)   lactated ringers Stopped (09/22/22 0524)   nitroGLYCERIN Stopped (09/20/22 1439)   norepinephrine (LEVOPHED) Adult infusion  Stopped (09/21/22 0306)   phenylephrine (NEO-SYNEPHRINE) Adult infusion Stopped (09/20/22 1901)   PRN Meds:.sodium chloride, dextrose, metoprolol tartrate, midazolam, morphine injection, ondansetron (ZOFRAN) IV, mouth rinse, oxyCODONE, sodium chloride flush, sodium chloride flush, traMADol  Xrays No results found.  Assessment/Plan: S/P Procedure(s) (LRB): CORONARY ARTERY BYPASS GRAFTING (CABG) X3 USING ENDOSCOPICALLY HARVESTED RIGHT GREATER SAPHENOUS VEIN (N/A) TRANSESOPHAGEAL ECHOCARDIOGRAM (TEE) (N/A)   POD #3 overall doing well 1 afebrile, blood pressure 100-150's, sinus rhythm/sinus tach- will increase beta blocker 2 oxygen saturations good on room air 3 good urine output, weight up 7 kg from preoperative if correct, will order po lasix/Kdur 4 chest drain 75 cc/24 H 5 normal  renal function 6 Blood sugars-adequate control, gradual transition to home meds over time-continue sliding scale and Levemir for now 7 chest x-ray shows left basilar atelectasis and effusion monitor closely, continue aggressive pulmonary hygiene 8 routine cardiac rehab, poss tx to tele       LOS: 3 days    John Giovanni [facility] Pager [phone] 09/23/2022

## 2022-09-23 NOTE — Progress Notes (Signed)
During rounds this morning, received the following verbal orders from Dr Tenny Craw: discontinue mediastinal JP drain, discontinue introducer, transfer patient to 4E.

## 2022-09-23 NOTE — Progress Notes (Signed)
Patient alert and oriented x 4. On RA . Sats >95% on the monitor. Transferred to 4E bed 5.

## 2022-09-23 NOTE — Progress Notes (Signed)
Patient arrived at the floor,CCMD notified,Vitals checked,patient oriented X4.call bell in reach.Care continues

## 2022-09-23 NOTE — Evaluation (Signed)
Occupational Therapy Evaluation Patient Details Name: Sheila Keller MRN: [id number] DOB: 05-15-1946 Today's Date: 09/23/2022   History of Present Illness 76 y.o female admitted 10/16 for CABG x 3 same date. PMHx: AS, HTN, T2DM, HLD   Clinical Impression   Pt admitted with the above diagnosis and has the deficits listed below. Pt would benefit from cont OT to increase independence with LE adls and toileting so she can d/c home alone. Because pt lives alone, do feel a short stay at [facility] SNF may be the best option for this pt to optimize her independence before returning home. Pt currently requires min assist with a few adls and is limited by pain from recent surgery. Will continue to see with focus on getting pt to mod I level of care with adls so she can eventually return home alone.      Recommendations for follow up therapy are one component of a multi-disciplinary discharge planning process, led by the attending physician.  Recommendations may be updated based on patient status, additional functional criteria and insurance authorization.   Follow Up Recommendations  Skilled nursing-short term rehab (<3 hours/day)    Assistance Recommended at Discharge Intermittent Supervision/Assistance  Patient can return home with the following A little help with walking and/or transfers;A little help with bathing/dressing/bathroom;Assistance with cooking/housework;Assist for transportation    Functional Status Assessment  Patient has had a recent decline in their functional status and demonstrates the ability to make significant improvements in function in a reasonable and predictable amount of time.  Equipment Recommendations  Other (comment) (tbd)    Recommendations for Other Services       Precautions / Restrictions Precautions Precautions: Sternal Precaution Comments: jp drain Restrictions Weight Bearing Restrictions: Yes RUE Weight Bearing: Partial weight bearing RUE Partial Weight  Bearing Percentage or Pounds: 10 LUE Weight Bearing: Partial weight bearing LUE Partial Weight Bearing Percentage or Pounds: 10      Mobility Bed Mobility Overal bed mobility: Needs Assistance Bed Mobility: Supine to Sit, Sit to Supine     Supine to sit: Min assist, HOB elevated Sit to supine: Min assist   General bed mobility comments: min assist to lift LE back into bed when turning to lay down    Transfers Overall transfer level: Needs assistance Equipment used: Rolling walker (2 wheels) Transfers: Sit to/from Stand Sit to Stand: Min guard           General transfer comment: cues for hand placement to rise from chair and BSC      Balance Overall balance assessment: Mild deficits observed, not formally tested                                         ADL either performed or assessed with clinical judgement   ADL Overall ADL's : Needs assistance/impaired Eating/Feeding: Independent;Sitting   Grooming: Wash/dry hands;Wash/dry face;Oral care;Supervision/safety;Standing   Upper Body Bathing: Set up;Sitting   Lower Body Bathing: Minimal assistance;Sit to/from stand;Cueing for compensatory techniques Lower Body Bathing Details (indicate cue type and reason): pt unable to reach behind her to wash backside due to precautions Upper Body Dressing : Minimal assistance;Sitting Upper Body Dressing Details (indicate cue type and reason): due to pain Lower Body Dressing: Minimal assistance;Sit to/from stand;Cueing for compensatory techniques Lower Body Dressing Details (indicate cue type and reason): Pt able to sit in figure 4 position to donn socks and shoes Toilet  Transfer: Min Patent examiner Details (indicate cue type and reason): Pt could walk to bathroom but chose to use BSC due to urgency. Toileting- Clothing Manipulation and Hygiene: Moderate assistance;Sit to/from stand;Cueing for compensatory techniques Toileting - Clothing  Manipulation Details (indicate cue type and reason): assist to clean self for first time due to precautions and pain     Functional mobility during ADLs: Min guard;Rolling walker (2 wheels) General ADL Comments: Pt limited when reaching behind her in toileting but able to cross legs to donn socks and shoes.  Pain most limiting factor.     Vision Baseline Vision/History: 0 No visual deficits Ability to See in Adequate Light: 0 Adequate Patient Visual Report: No change from baseline Vision Assessment?: No apparent visual deficits     Perception     Praxis      Pertinent Vitals/Pain Pain Assessment Pain Assessment: Faces Faces Pain Scale: Hurts little more Pain Location: incision Pain Descriptors / Indicators: Sore Pain Intervention(s): Monitored during session, Repositioned     Hand Dominance Right   Extremity/Trunk Assessment Upper Extremity Assessment Upper Extremity Assessment: Overall WFL for tasks assessed (within limits of sternal precautions)   Lower Extremity Assessment Lower Extremity Assessment: Defer to PT evaluation   Cervical / Trunk Assessment Cervical / Trunk Assessment: Normal   Communication Communication Communication: No difficulties   Cognition Arousal/Alertness: Awake/alert Behavior During Therapy: WFL for tasks assessed/performed Overall Cognitive Status: Within Functional Limits for tasks assessed                                       General Comments  Pt doing very well so early on with adls. pt lives alone at home so may need ST rehab to get to mod I level of care    Exercises     Shoulder Instructions      Home Living Family/patient expects to be discharged to:: Private residence Living Arrangements: Alone Available Help at Discharge: Family;Available PRN/intermittently Type of Home: House Home Access: Stairs to enter [employer] of Steps: 2 Entrance Stairs-Rails: None Home Layout: One level      Bathroom Shower/Tub: Teacher, early years/pre: Standard     Home Equipment: Shower seat - built in;Grab bars - toilet          Prior Functioning/Environment Prior Level of Function : Independent/Modified Independent                        OT Problem List: Decreased range of motion;Impaired balance (sitting and/or standing);Decreased knowledge of use of DME or AE;Pain      OT Treatment/Interventions: Self-care/ADL training;Balance training;DME and/or AE instruction;Therapeutic activities    OT Goals(Current goals can be found in the care plan section) Acute Rehab OT Goals Patient Stated Goal: to go to rehab then home since I love alone OT Goal Formulation: With patient Time For Goal Achievement: 10/07/22 Potential to Achieve Goals: Good ADL Goals Pt Will Perform Lower Body Bathing: with supervision;sit to/from stand Pt Will Perform Lower Body Dressing: with supervision;sit to/from stand Additional ADL Goal #1: Pt will walk to bathroom with walker and complete all toileting with supervision.  OT Frequency: Min 2X/week    Co-evaluation              AM-PAC OT "6 Clicks" Daily Activity     Outcome Measure Help from another person eating meals?: None  Help from another person taking care of personal grooming?: None Help from another person toileting, which includes using toliet, bedpan, or urinal?: A Lot Help from another person bathing (including washing, rinsing, drying)?: A Little Help from another person to put on and taking off regular upper body clothing?: A Little Help from another person to put on and taking off regular lower body clothing?: A Little 6 Click Score: 19   End of Session Equipment Utilized During Treatment: Rolling walker (2 wheels) Nurse Communication: Mobility status  Activity Tolerance: Patient tolerated treatment well Patient left: in bed;with call bell/phone within reach  OT Visit Diagnosis: Unsteadiness on feet (R26.81)                 Time: 0315-9458 OT Time Calculation (min): 22 min Charges:  OT General Charges $OT Visit: 1 Visit OT Evaluation $OT Eval Moderate Complexity: 1 Mod  Glenford Peers 09/23/2022, 12:22 PM

## 2022-09-23 NOTE — TOC Initial Note (Addendum)
Transition of Care [facility]) - Initial/Assessment Note    Patient Details  Name: Sheila Keller MRN: [id number] Date of Birth: 11-24-1946  [facility] of [facility] CM/SW Contact:    Milas Gain, [place] Phone Number: 09/23/2022, 4:29 PM  Clinical Narrative:                  CSW received consult for possible SNF placement at time of discharge. CSW spoke with patient regarding PT recommendation of SNF placement at time of discharge. Patient reports she comes from home alone.Patient expressed understanding of PT recommendation and is agreeable to SNF placement at time of discharge. Patient reports preference for [facility] . Patient gave CSW permission to fax out initial referral near the [place] area. CSW discussed insurance authorization process and will provide Medicare SNF ratings list with accepted SNF bed offers when available. Patient reports she has received the COVID vaccines as well as 1 booster. Patient expressed being hopeful for rehab and to feel better soon. No further questions reported at this time. CSW to continue to follow and assist with discharge planning needs.   Update- CSW received call from Ranger with [facility] confirmed cannot offer bed/private room for patient.   Expected Discharge Plan: Skilled Nursing Facility Barriers to Discharge: Continued Medical Work up   Patient Goals and CMS Choice Patient states their goals for this hospitalization and ongoing recovery are:: SNF CMS Medicare.gov Compare Post Acute Care list provided to:: Patient Choice offered to / list presented to : Patient  Expected Discharge Plan and Services Expected Discharge Plan: Skilled Nursing Facility In-house Referral: Clinical Social Work Discharge Planning Services: CM Consult Post Acute Care Choice: IP Rehab Living arrangements for the past 2 months: Single Family Home                   DME Agency: NA                  Prior Living Arrangements/Services Living  arrangements for the past 2 months: Single Family Home Lives with:: Self Patient language and need for interpreter reviewed:: Yes Do you feel safe going back to the place where you live?: No   SNF  Need for Family Participation in Patient Care: Yes (Comment) Care giver support system in place?: Yes (comment)   Criminal Activity/Legal Involvement Pertinent to Current Situation/Hospitalization: No - Comment as needed  Activities of Daily Living      Permission Sought/Granted Permission sought to share information with : Case Manager, Family Supports, Customer service manager Permission granted to share information with : Yes, Verbal Permission Granted  Share Information with NAME: Marlou Sa  Permission granted to share info w AGENCY: SNF  Permission granted to share info w Relationship: Son  Permission granted to share info w Contact Information: Marlou Sa [phone]  Emotional Assessment Appearance:: Appears stated age Attitude/Demeanor/Rapport: Gracious Affect (typically observed): Calm Orientation: : Oriented to Self, Oriented to Place, Oriented to  Time, Oriented to Situation Alcohol / Substance Use: Not Applicable Psych Involvement: No (comment)  Admission diagnosis:  S/P CABG x 3 [Z95.1] Patient Active Problem List   Diagnosis Date Noted   S/P CABG x 3 09/20/2022   CAD (coronary artery disease) 09/09/2022   Osteopenia 01/14/2016   Intramural leiomyoma of uterus 09/30/2014   DM II (diabetes mellitus, type II), controlled ([place]) 09/19/2013   Hypertension    Elevated cholesterol    PCP:  Maury Dus, MD Pharmacy:   [id number] [place], [place]  [address] [place] [address] Phone: [phone] Fax: [phone]     Social Determinants of Health (SDOH) Interventions    Readmission Risk Interventions     No data to display

## 2022-09-23 NOTE — [place] FL2 (Signed)
[place] MEDICAID FL2 LEVEL OF CARE SCREENING TOOL     IDENTIFICATION  Patient Name: Sheila Keller Birthdate: 16-Sep-1946 Sex: female Admission Date (Current Location): 09/20/2022  [facility] and [place] Number:  Herbalist and Address:  [facility], [place] [address] [place], [place] [address]      Provider Number: [id number]  Attending Physician Name and Address:  Enter, Pierre Bali, MD  Relative Name and Phone Number:  Marlou Sa [phone]) [phone]    Current Level of Care: Hospital Recommended Level of Care: [place] Prior Approval Number:    Date Approved/Denied:   PASRR Number: [id number]  Discharge Plan: SNF    Current Diagnoses: Patient Active Problem List   Diagnosis Date Noted   S/P CABG x 3 09/20/2022   CAD (coronary artery disease) 09/09/2022   Osteopenia 01/14/2016   Intramural leiomyoma of uterus 09/30/2014   DM II (diabetes mellitus, type II), controlled ([place]) 09/19/2013   Hypertension    Elevated cholesterol     Orientation RESPIRATION BLADDER Height & Weight     Self, Time, Situation, Place  Normal Continent Weight: 166 lb 3.6 oz (75.4 kg) Height:  '5\' 3"'$  (160 cm)  BEHAVIORAL SYMPTOMS/MOOD NEUROLOGICAL BOWEL NUTRITION STATUS      Continent (WDL) Diet (Please see dischage summary)  AMBULATORY STATUS COMMUNICATION OF NEEDS Skin   Limited Assist Verbally Other (Comment) (Ecchymosis,arm,neck,L,R,non-tenting,Incision closed,chest,other,dressing in place,wound incision LDAs,Incision closed,Leg,dressing in place)                       Personal Care Assistance Level of Assistance  Bathing, Feeding, Dressing Bathing Assistance: Limited assistance Feeding assistance: Independent (can feed self) Dressing Assistance: Limited assistance     Functional Limitations Info  Sight, Hearing, Speech Sight Info: Adequate (WDL) Hearing Info: Adequate Speech Info: Adequate    SPECIAL CARE FACTORS FREQUENCY  PT (By  licensed PT), OT (By licensed OT)     PT Frequency: 5x min weekly OT Frequency: 5x min weekly            Contractures Contractures Info: Not present    Additional Factors Info  Code Status, Allergies Code Status Info: FULL Allergies Info: Bee Venom,Farxiga (dapagliflozin),Penicillins           Current Medications (09/23/2022):  This is the current hospital active medication list Current Facility-Administered Medications  Medication Dose Route Frequency Provider Last Rate Last Admin   0.45 % sodium chloride infusion   Intravenous Continuous PRN Gold, Wilder Glade, PA-C   Stopped at 09/21/22 0855   0.9 %  sodium chloride infusion  250 mL Intravenous Continuous Gold, Wayne E, PA-C       0.9 %  sodium chloride infusion   Intravenous Continuous Gold, Wayne E, PA-C   Stopped at 09/22/22 1539   acetaminophen (TYLENOL) tablet 1,000 mg  1,000 mg Oral Q6H Gold, Wayne E, PA-C   1,000 mg at 09/22/22 1321   Or   acetaminophen (TYLENOL) 160 MG/5ML solution 1,000 mg  1,000 mg Per Tube Q6H Gold, Wayne E, PA-C   1,000 mg at 09/23/22 1233   aspirin EC tablet 325 mg  325 mg Oral Daily Gold, Wayne E, PA-C   325 mg at 09/23/22 1100   Or   aspirin chewable tablet 324 mg  324 mg Per Tube Daily Gold, Wayne E, PA-C       atorvastatin (LIPITOR) tablet 80 mg  80 mg Oral Daily Gold, Patrick Jupiter E, PA-C  80 mg at 09/23/22 1059   bisacodyl (DULCOLAX) EC tablet 10 mg  10 mg Oral Daily Gold, Wayne E, PA-C   10 mg at 09/23/22 1100   Or   bisacodyl (DULCOLAX) suppository 10 mg  10 mg Rectal Daily Gold, Wayne E, PA-C       Chlorhexidine Gluconate Cloth 2 % PADS 6 each  6 each Topical Daily Enter, Pierre Bali, MD   6 each at 09/23/22 0930   dexmedetomidine (PRECEDEX) 400 MCG/100ML (4 mcg/mL) infusion  0-0.7 mcg/kg/hr Intravenous Continuous Gold, Wayne E, PA-C   Stopped at 09/20/22 1708   dextrose 50 % solution 0-50 mL  0-50 mL Intravenous PRN Gold, Wayne E, PA-C       docusate sodium (COLACE) capsule 200 mg  200 mg Oral  Daily Gold, Wayne E, PA-C   200 mg at 09/23/22 1059   EPINEPHrine (ADRENALIN) 5 mg in NS 250 mL (0.02 mg/mL) premix infusion  0-10 mcg/min Intravenous Titrated John Giovanni, PA-C   Held at 09/20/22 1441   furosemide (LASIX) tablet 40 mg  40 mg Oral Daily Gold, Wayne E, PA-C   40 mg at 09/23/22 1100   insulin aspart (novoLOG) injection 0-15 Units  0-15 Units Subcutaneous TID WC Enter, Pierre Bali, MD   3 Units at 09/23/22 1226   insulin aspart (novoLOG) injection 0-5 Units  0-5 Units Subcutaneous QHS Enter, Pierre Bali, MD       insulin detemir (LEVEMIR) injection 8 Units  8 Units Subcutaneous BID Enter, Pierre Bali, MD   8 Units at 09/23/22 1100   lactated ringers infusion   Intravenous Continuous Gold, Wayne E, PA-C   Held at 09/20/22 1440   lactated ringers infusion   Intravenous Continuous Gold, Wayne E, PA-C   Stopped at 09/22/22 0524   metoprolol tartrate (LOPRESSOR) tablet 25 mg  25 mg Oral BID Jadene Pierini E, PA-C   25 mg at 09/23/22 1100   Or   metoprolol tartrate (LOPRESSOR) 25 mg/10 mL oral suspension 12.5 mg  12.5 mg Per Tube BID Gold, Wayne E, PA-C       metoprolol tartrate (LOPRESSOR) injection 2.5-5 mg  2.5-5 mg Intravenous Q2H PRN Gold, Wayne E, PA-C       midazolam (VERSED) injection 2 mg  2 mg Intravenous Q1H PRN Gold, Wayne E, PA-C       morphine (PF) 2 MG/ML injection 1-4 mg  1-4 mg Intravenous Q1H PRN Jadene Pierini E, PA-C   2 mg at 09/22/22 0951   nitroGLYCERIN 50 mg in dextrose 5 % 250 mL (0.2 mg/mL) infusion  0-100 mcg/min Intravenous Titrated Jadene Pierini E, PA-C   Held at 09/20/22 1439   norepinephrine (LEVOPHED) '4mg'$  in 298m (0.016 mg/mL) premix infusion  0-10 mcg/min Intravenous Titrated Gold, Wayne E, PA-C   Stopped at 09/21/22 0306   ondansetron (ZOFRAN) injection 4 mg  4 mg Intravenous Q6H PRN GJadene PieriniE, PA-C   4 mg at 09/22/22 1014   Oral care mouth rinse  15 mL Mouth Rinse PRN Enter, DPierre Bali MD       oxyCODONE (Oxy IR/ROXICODONE) immediate release tablet 5-10 mg  5-10 mg  Oral Q3H PRN GJadene PieriniE, PA-C   5 mg at 09/21/22 1152   pantoprazole (PROTONIX) EC tablet 40 mg  40 mg Oral Daily Gold, Wayne E, PA-C   40 mg at 09/23/22 1059   phenylephrine (NEO-SYNEPHRINE) '20mg'$ /NS 2537mpremix infusion  0-400 mcg/min Intravenous Titrated GoJohn GiovanniPA-C  Stopped at 09/20/22 1901   potassium chloride SA (KLOR-CON M) CR tablet 20 mEq  20 mEq Oral Daily Gold, Wayne E, PA-C   20 mEq at 09/23/22 1059   sodium chloride flush (NS) 0.9 % injection 10-40 mL  10-40 mL Intracatheter Q12H Enter, Pierre Bali, MD   10 mL at 09/22/22 2129   sodium chloride flush (NS) 0.9 % injection 10-40 mL  10-40 mL Intracatheter PRN Enter, Pierre Bali, MD       sodium chloride flush (NS) 0.9 % injection 3 mL  3 mL Intravenous Q12H Gold, Wayne E, PA-C   3 mL at 09/23/22 1217   sodium chloride flush (NS) 0.9 % injection 3 mL  3 mL Intravenous PRN Gold, Wayne E, PA-C       traMADol (ULTRAM) tablet 50-100 mg  50-100 mg Oral Q4H PRN Gold, Wayne E, PA-C   50 mg at 09/23/22 1253     Discharge Medications: Please see discharge summary for a list of discharge medications.  Relevant Imaging Results:  Relevant Lab Results:   Additional Information [id number]  Milas Gain, LCSWA

## 2022-09-24 LAB — BASIC METABOLIC PANEL
Anion gap: 6 (ref 5–15)
BUN: 11 mg/dL (ref 8–23)
CO2: 20 mmol/L — ABNORMAL LOW (ref 22–32)
Calcium: 7.9 mg/dL — ABNORMAL LOW (ref 8.9–10.3)
Chloride: 113 mmol/L — ABNORMAL HIGH (ref 98–111)
Creatinine, Ser: 0.69 mg/dL (ref 0.44–1.00)
GFR, Estimated: >60 mL/min (ref >60)
Glucose, Bld: 149 mg/dL — ABNORMAL HIGH (ref 70–99)
Potassium: 3.9 mmol/L (ref 3.5–5.1)
Sodium: 139 mmol/L (ref 135–145)

## 2022-09-24 LAB — TYPE AND SCREEN
ABO/RH(D): O POS
Antibody Screen: NEGATIVE
Unit division: 0
Unit division: 0
Unit division: 0
Unit division: 0

## 2022-09-24 LAB — BPAM RBC
Blood Product Expiration Date: 202311162359
Blood Product Expiration Date: 202311162359
Blood Product Expiration Date: 202311162359
Blood Product Expiration Date: 202311162359
ISSUE DATE / TIME: 202310161038
ISSUE DATE / TIME: 202310161038
Unit Type and Rh: 5100
Unit Type and Rh: 5100
Unit Type and Rh: 5100
Unit Type and Rh: 5100

## 2022-09-24 LAB — GLUCOSE, CAPILLARY
Glucose-Capillary: 189 mg/dL — ABNORMAL HIGH (ref 70–99)
Glucose-Capillary: 218 mg/dL — ABNORMAL HIGH (ref 70–99)
Glucose-Capillary: 118 mg/dL — ABNORMAL HIGH (ref 70–99)
Glucose-Capillary: 163 mg/dL — ABNORMAL HIGH (ref 70–99)

## 2022-09-24 MED ORDER — EMPAGLIFLOZIN 25 MG PO TABS
25.0000 mg | ORAL_TABLET | Freq: Every day | ORAL | Status: DC
  Administered 2022-09-24 – 2022-09-27 (×4): 25 mg via ORAL
  Filled 2022-09-24 (×4): qty 1

## 2022-09-24 MED ORDER — INSULIN ASPART 100 UNIT/ML IJ SOLN
0.0000 [IU] | Freq: Three times a day (TID) | INTRAMUSCULAR | Status: DC
  Administered 2022-09-24: 3 [IU] via SUBCUTANEOUS
  Administered 2022-09-25 – 2022-09-26 (×4): 1 [IU] via SUBCUTANEOUS

## 2022-09-24 MED ORDER — METFORMIN HCL 500 MG PO TABS
1000.0000 mg | ORAL_TABLET | Freq: Two times a day (BID) | ORAL | Status: DC
  Administered 2022-09-24 – 2022-09-27 (×7): 1000 mg via ORAL
  Filled 2022-09-24 (×7): qty 2

## 2022-09-24 MED ORDER — LINAGLIPTIN 5 MG PO TABS
5.0000 mg | ORAL_TABLET | Freq: Every day | ORAL | Status: DC
  Administered 2022-09-24 – 2022-09-27 (×4): 5 mg via ORAL
  Filled 2022-09-24 (×4): qty 1

## 2022-09-24 MED ORDER — FUROSEMIDE 10 MG/ML IJ SOLN
40.0000 mg | Freq: Once | INTRAMUSCULAR | Status: AC
  Administered 2022-09-24: 40 mg via INTRAVENOUS
  Filled 2022-09-24: qty 4

## 2022-09-24 NOTE — Progress Notes (Signed)
Mobility Specialist Progress Note:   09/24/22 1030  Mobility  Activity Ambulated with assistance in room;Ambulated with assistance to bathroom  Level of Assistance Standby assist, set-up cues, supervision of patient - no hands on  Assistive Device None  Distance Ambulated (ft) 40 ft  Activity Response Tolerated well  $Mobility charge 1 Mobility   Pt received in chair asking to go to bathroom. I encouraged hallway ambulation but pt declined d/t receiving pain medicine and feeling "woozy". Left in bed with call bell in reach and all needs met.   [facility] Surveyor, mining Chat only

## 2022-09-24 NOTE — Progress Notes (Signed)
CARDIAC REHAB PHASE I     Pt resting gin bed. Having incisional and gas pain today. Reports frequent bowel movements today. States I just feel bad today. Encouraged ambulation and IS use. Will continue to follow.   [id number]  Vanessa Barbara, RN BSN 09/24/2022 2:07 PM

## 2022-09-24 NOTE — Care Management Important Message (Signed)
Important Message  Patient Details  Name: Sheila Keller MRN: [id number] Date of Birth: 05/19/1946   Medicare Important Message Given:  Yes     [name] 09/24/2022, 9:08 AM

## 2022-09-24 NOTE — Progress Notes (Signed)
      [address]       [place], [address]             [phone]      4 Days Post-Op Procedure(s) (LRB): CORONARY ARTERY BYPASS GRAFTING (CABG) X3 USING ENDOSCOPICALLY HARVESTED RIGHT GREATER SAPHENOUS VEIN (N/A) TRANSESOPHAGEAL ECHOCARDIOGRAM (TEE) (N/A)  Subjective:  Patient has several questions.  She is concerned about her weight gain.  Also questions why she is on insulin when she takes oral medications at home  Objective: Vital signs in last 24 hours: Temp:  [97.9 F (36.6 C)-98.7 F (37.1 C)] 98.3 F (36.8 C) (10/20 0416) Pulse Rate:  [84-114] 93 (10/20 0416) Cardiac Rhythm: Sinus tachycardia (10/19 0900) Resp:  [17-29] 18 (10/20 0416) BP: (87-135)/(50-82) 112/71 (10/20 0416) SpO2:  [93 %-100 %] 95 % (10/20 0416) Weight:  [74.3 kg] 74.3 kg (10/20 0500)  Intake/Output from previous day: 10/19 0701 - 10/20 0700 In: 100 [P.O.:100] Out: 210 [Urine:200; Drains:10]  General appearance: alert, cooperative, and no distress Heart: regular rate and rhythm and tachy Lungs: clear to auscultation bilaterally Abdomen: soft, non-tender; bowel sounds normal; no masses,  no organomegaly Extremities: edema trace to mild pitting Wound: clean and dry  Lab Results: Recent Labs    09/21/22 2139 09/22/22 0349  WBC 8.8 7.8  HGB 8.6* 8.7*  HCT 25.5* 26.8*  PLT 85* 85*   BMET:  Recent Labs    09/23/22 0303 09/24/22 0102  NA 139 139  K 4.1 3.9  CL 112* 113*  CO2 22 20*  GLUCOSE 187* 149*  BUN 13 11  CREATININE 0.63 0.69  CALCIUM 7.6* 7.9*    PT/INR: No results for input(s): "LABPROT", "INR" in the last 72 hours. ABG    Component Value Date/Time   PHART 7.321 (L) 09/20/2022 2000   HCO3 20.6 09/20/2022 2000   TCO2 22 09/20/2022 2000   ACIDBASEDEF 5.0 (H) 09/20/2022 2000   O2SAT 99 09/20/2022 2000   CBG (last 3)  Recent Labs    09/23/22 1607 09/23/22 2246 09/24/22 0554  GLUCAP 148* 154* 189*    Assessment/Plan: S/P Procedure(s)  (LRB): CORONARY ARTERY BYPASS GRAFTING (CABG) X3 USING ENDOSCOPICALLY HARVESTED RIGHT GREATER SAPHENOUS VEIN (N/A) TRANSESOPHAGEAL ECHOCARDIOGRAM (TEE) (N/A)  CV- Sinus tachy, BP is labile- continue Lopressor at 25 mg BID Pulm- off oxygen no acute issues Renal- creatinine has been stable, weight is up from surgeyr... will give IV lasix today, supplement potassium DM- sugars are controlled, stop insulin and resume home Metformin, Jardiance, Januvia, continue SSIP Deconditioning- needs SNF at discharge patient is will to have not private room at [facility] if available, would be ok with [facility] in [place]- patient stable, will diurese today, ready for d/c when SNF bed is available   LOS: 4 days    Ellwood Handler, PA-C 09/24/2022

## 2022-09-25 LAB — GLUCOSE, CAPILLARY
Glucose-Capillary: 130 mg/dL — ABNORMAL HIGH (ref 70–99)
Glucose-Capillary: 114 mg/dL — ABNORMAL HIGH (ref 70–99)
Glucose-Capillary: 121 mg/dL — ABNORMAL HIGH (ref 70–99)
Glucose-Capillary: 133 mg/dL — ABNORMAL HIGH (ref 70–99)

## 2022-09-25 MED ORDER — FUROSEMIDE 40 MG PO TABS
40.0000 mg | ORAL_TABLET | Freq: Every day | ORAL | Status: AC
  Administered 2022-09-25 – 2022-09-26 (×2): 40 mg via ORAL
  Filled 2022-09-25 (×2): qty 1

## 2022-09-25 MED ORDER — POTASSIUM CHLORIDE CRYS ER 20 MEQ PO TBCR
20.0000 meq | EXTENDED_RELEASE_TABLET | Freq: Every day | ORAL | Status: DC
  Administered 2022-09-25: 20 meq via ORAL
  Filled 2022-09-25 (×2): qty 1

## 2022-09-25 NOTE — Progress Notes (Signed)
[address]       [place] [address]             [phone]      5 Days Post-Op Procedure(s) (LRB): CORONARY ARTERY BYPASS GRAFTING (CABG) X3 USING ENDOSCOPICALLY HARVESTED RIGHT GREATER SAPHENOUS VEIN (N/A) TRANSESOPHAGEAL ECHOCARDIOGRAM (TEE) (N/A) Subjective: Feels generally well, better each day  Objective: Vital signs in last 24 hours: Temp:  [97.5 F (36.4 C)-98.5 F (36.9 C)] 98 F (36.7 C) (10/21 0541) Pulse Rate:  [98-107] 100 (10/21 0541) Cardiac Rhythm: Sinus tachycardia (10/20 1900) Resp:  [18-20] 20 (10/21 0541) BP: (95-123)/(54-75) 104/75 (10/21 0541) SpO2:  [96 %-100 %] 100 % (10/21 0541) Weight:  [72.8 kg] 72.8 kg (10/21 0551)  Hemodynamic parameters for last 24 hours:    Intake/Output from previous day: 10/20 0701 - 10/21 0700 In: 250 [P.O.:240; I.V.:10] Out: 1300 [Urine:1300] Intake/Output this shift: No intake/output data recorded.  General appearance: alert, cooperative, and no distress Heart: regular rate and rhythm and soft systolic murmur Lungs: Diminished left greater than right base Abdomen: Benign exam Extremities: No edema Wound: Incisions healing well without evidence of infection  Lab Results: No results for input(s): "WBC", "HGB", "HCT", "PLT" in the last 72 hours. BMET:  Recent Labs    09/23/22 0303 09/24/22 0102  NA 139 139  K 4.1 3.9  CL 112* 113*  CO2 22 20*  GLUCOSE 187* 149*  BUN 13 11  CREATININE 0.63 0.69  CALCIUM 7.6* 7.9*    PT/INR: No results for input(s): "LABPROT", "INR" in the last 72 hours. ABG    Component Value Date/Time   PHART 7.321 (L) 09/20/2022 2000   HCO3 20.6 09/20/2022 2000   TCO2 22 09/20/2022 2000   ACIDBASEDEF 5.0 (H) 09/20/2022 2000   O2SAT 99 09/20/2022 2000   CBG (last 3)  Recent Labs    09/24/22 1626 09/24/22 2125 09/25/22 0620  GLUCAP 118* 163* 130*    Meds Scheduled Meds:  acetaminophen  1,000 mg Oral Q6H   Or   acetaminophen (TYLENOL) oral liquid  160 mg/5 mL  1,000 mg Per Tube Q6H   aspirin EC  325 mg Oral Daily   Or   aspirin  324 mg Per Tube Daily   atorvastatin  80 mg Oral Daily   bisacodyl  10 mg Oral Daily   Or   bisacodyl  10 mg Rectal Daily   Chlorhexidine Gluconate Cloth  6 each Topical Daily   docusate sodium  200 mg Oral Daily   empagliflozin  25 mg Oral Daily   insulin aspart  0-9 Units Subcutaneous TID WC   linagliptin  5 mg Oral Daily   metFORMIN  1,000 mg Oral BID WC   metoprolol tartrate  25 mg Oral BID   pantoprazole  40 mg Oral Daily   potassium chloride  20 mEq Oral Daily   sodium chloride flush  3 mL Intravenous Q12H   Continuous Infusions: PRN Meds:.metoprolol tartrate, ondansetron (ZOFRAN) IV, mouth rinse, oxyCODONE, sodium chloride flush, traMADol  Xrays No results found.  Assessment/Plan: S/P Procedure(s) (LRB): CORONARY ARTERY BYPASS GRAFTING (CABG) X3 USING ENDOSCOPICALLY HARVESTED RIGHT GREATER SAPHENOUS VEIN (N/A) TRANSESOPHAGEAL ECHOCARDIOGRAM (TEE) (N/A) POD #5  1 afebrile, vital signs stable with systolic blood pressure 26E to 120s sinus rhythm, 2 Oxygen sats good on room air 3.  Urine output, weight trended, approximately 4 kg greater than preop-continue diuretics 4 blood sugar control is mostly good, 1 reading in the 200s-now back  on home diabetes medications 5 normal renal function 6 skilled nursing facility bed search is underway 7 continue routine incentive spirometry/pulmonary hygiene as well as cardiac rehab    LOS: 5 days    Sheila Keller 09/25/2022

## 2022-09-25 NOTE — Progress Notes (Signed)
Mobility Specialist Progress Note:   09/25/22 0955  Mobility  Activity Ambulated with assistance in hallway  Level of Assistance Standby assist, set-up cues, supervision of patient - no hands on  Assistive Device Front wheel walker  Distance Ambulated (ft) 60 ft  Activity Response Tolerated well  $Mobility charge 1 Mobility   Pt received in bed willing to participate in mobility. No complaints of pain. Left in bed with call bell in reach and all needs met.   [facility] Surveyor, mining Chat only

## 2022-09-25 NOTE — Progress Notes (Signed)
Education deferred b/c pt walked with mobility. Pt was educated on sternal precautions, restrictions, ex guidelines, heart healthy diet, and CRPII. Pt will be referred to [facility].

## 2022-09-25 NOTE — TOC Progression Note (Addendum)
Transition of Care [facility]) - Progression Note    Patient Details  Name: ANNISTEN MANCHESTER MRN: [id number] Date of Birth: [birth year]  Transition of Care [facility]) CM/SW [place], [place] Phone Number: 09/25/2022, 9:53 AM  Clinical Narrative:     Update-2:57pm- CSW received callback from patients son Marlou Sa who chose SNF placement for patient at [place]. CSW awaiting callback from [place] with [facility] to confirm SNF bed for patient.   Update- 1:46pm- CSW received callback from [place] with [facility] who confirmed unable to offer patient SNF bed .[facility] currently not accepting any new admissions due to covid outbreak. CSW informed patient. CSW provided patient with her additional SNF bed offers. Patient is going to review and follow back up with CSW on SNF choice.  CSW LVM for [place] with [facility] to check on status of patients request to see if facility able to offer patient a semi private room. CSW awaiting callback. CSW following to start insurance authorization close to patient being medically ready for dc.  Expected Discharge Plan: [place] Barriers to Discharge: Continued Medical Work up  Expected Discharge Plan and Services Expected Discharge Plan: [place] In-house Referral: Clinical Social Work Discharge Planning Services: CM Consult Post Acute Care Choice: IP Rehab Living arrangements for the past 2 months: Single Family Home                   DME Agency: NA                   Social Determinants of Health (SDOH) Interventions    Readmission Risk Interventions     No data to display

## 2022-09-26 LAB — GLUCOSE, CAPILLARY
Glucose-Capillary: 126 mg/dL — ABNORMAL HIGH (ref 70–99)
Glucose-Capillary: 149 mg/dL — ABNORMAL HIGH (ref 70–99)
Glucose-Capillary: 108 mg/dL — ABNORMAL HIGH (ref 70–99)
Glucose-Capillary: 135 mg/dL — ABNORMAL HIGH (ref 70–99)

## 2022-09-26 LAB — SARS CORONAVIRUS 2 (TAT 6-24 HRS): SARS Coronavirus 2: NEGATIVE

## 2022-09-26 LAB — BASIC METABOLIC PANEL
Anion gap: 9 (ref 5–15)
BUN: 18 mg/dL (ref 8–23)
CO2: 20 mmol/L — ABNORMAL LOW (ref 22–32)
Calcium: 8.5 mg/dL — ABNORMAL LOW (ref 8.9–10.3)
Chloride: 110 mmol/L (ref 98–111)
Creatinine, Ser: 0.89 mg/dL (ref 0.44–1.00)
GFR, Estimated: >60 mL/min (ref >60)
Glucose, Bld: 142 mg/dL — ABNORMAL HIGH (ref 70–99)
Potassium: 3.6 mmol/L (ref 3.5–5.1)
Sodium: 139 mmol/L (ref 135–145)

## 2022-09-26 LAB — MAGNESIUM: Magnesium: 1.9 mg/dL (ref 1.7–2.4)

## 2022-09-26 MED ORDER — POTASSIUM CHLORIDE CRYS ER 20 MEQ PO TBCR
20.0000 meq | EXTENDED_RELEASE_TABLET | Freq: Once | ORAL | Status: AC
  Administered 2022-09-26: 20 meq via ORAL
  Filled 2022-09-26: qty 1

## 2022-09-26 NOTE — TOC Progression Note (Signed)
Transition of Care [facility]) - Progression Note    Patient Details  Name: Sheila Keller MRN: [id number] Date of Birth: [birth year]  Transition of Care [facility]) CM/SW Contact  Reece Agar, [place] Phone Number: 09/26/2022, 10:44 AM  Clinical Narrative:    Josem Kaufmann started on 10/22 ref# [id number], pending.    Expected Discharge Plan: [place] Barriers to Discharge: Continued Medical Work up  Expected Discharge Plan and Services Expected Discharge Plan: [place] In-house Referral: Clinical Social Work Discharge Planning Services: CM Consult Post Acute Care Choice: IP Rehab Living arrangements for the past 2 months: Single Family Home                   DME Agency: NA                   Social Determinants of Health (SDOH) Interventions    Readmission Risk Interventions     No data to display

## 2022-09-26 NOTE — Progress Notes (Signed)
[address]       [place] [address]             [phone]      6 Days Post-Op Procedure(s) (LRB): CORONARY ARTERY BYPASS GRAFTING (CABG) X3 USING ENDOSCOPICALLY HARVESTED RIGHT GREATER SAPHENOUS VEIN (N/A) TRANSESOPHAGEAL ECHOCARDIOGRAM (TEE) (N/A) Subjective: Continues to feel well overall, some sternal discomfort  Objective: Vital signs in last 24 hours: Temp:  [97.8 F (36.6 C)-98.2 F (36.8 C)] 97.8 F (36.6 C) (10/22 0437) Pulse Rate:  [86-108] 94 (10/22 0437) Cardiac Rhythm: Sinus tachycardia;Bundle branch block (10/21 1901) Resp:  [18-22] 20 (10/22 0500) BP: (94-137)/(53-85) 137/72 (10/22 0437) SpO2:  [96 %-100 %] 96 % (10/22 0437) Weight:  [71.1 kg] 71.1 kg (10/22 0500)  Hemodynamic parameters for last 24 hours:    Intake/Output from previous day: 10/21 0701 - 10/22 0700 In: 440 [P.O.:440] Out: 2300 [Urine:2300] Intake/Output this shift: Total I/O In: -  Out: 200 [Urine:200]  General appearance: alert, cooperative, and no distress Heart: regular rate and rhythm and soft systolic murmur Lungs: Minimally diminished in the bases Abdomen: Benign exam Extremities: No edema Wound: Incisions healing well without evidence of infection  Lab Results: No results for input(s): "WBC", "HGB", "HCT", "PLT" in the last 72 hours. BMET:  Recent Labs    09/24/22 0102  NA 139  K 3.9  CL 113*  CO2 20*  GLUCOSE 149*  BUN 11  CREATININE 0.69  CALCIUM 7.9*    PT/INR: No results for input(s): "LABPROT", "INR" in the last 72 hours. ABG    Component Value Date/Time   PHART 7.321 (L) 09/20/2022 2000   HCO3 20.6 09/20/2022 2000   TCO2 22 09/20/2022 2000   ACIDBASEDEF 5.0 (H) 09/20/2022 2000   O2SAT 99 09/20/2022 2000   CBG (last 3)  Recent Labs    09/25/22 1601 09/25/22 2106 09/26/22 0545  GLUCAP 121* 133* 126*    Meds Scheduled Meds:  aspirin EC  325 mg Oral Daily   Or   aspirin  324 mg Per Tube Daily   atorvastatin  80 mg  Oral Daily   bisacodyl  10 mg Oral Daily   Or   bisacodyl  10 mg Rectal Daily   Chlorhexidine Gluconate Cloth  6 each Topical Daily   docusate sodium  200 mg Oral Daily   empagliflozin  25 mg Oral Daily   furosemide  40 mg Oral Daily   insulin aspart  0-9 Units Subcutaneous TID WC   linagliptin  5 mg Oral Daily   metFORMIN  1,000 mg Oral BID WC   metoprolol tartrate  25 mg Oral BID   pantoprazole  40 mg Oral Daily   potassium chloride  20 mEq Oral Daily   sodium chloride flush  3 mL Intravenous Q12H   Continuous Infusions: PRN Meds:.metoprolol tartrate, ondansetron (ZOFRAN) IV, mouth rinse, oxyCODONE, sodium chloride flush, traMADol  Xrays No results found.  Assessment/Plan: S/P Procedure(s) (LRB): CORONARY ARTERY BYPASS GRAFTING (CABG) X3 USING ENDOSCOPICALLY HARVESTED RIGHT GREATER SAPHENOUS VEIN (N/A) TRANSESOPHAGEAL ECHOCARDIOGRAM (TEE) (N/A)  POD #6  1 afebrile, vital signs stable, sinus rhythm/sinus tach, short bursts of supraventricular tachycardia into the 140s.  May need to increase beta-blocker but blood pressure could be limiting .  We will check potassium and magnesium levels.  SBP 90s to 130s 2 O2 sats good on room air 3 excellent diuresis, weight continues to decrease and is approximately 2 kg greater than preop-we will continue diuretics for  now 4 blood sugars well controlled on current medication regimen 5 no new labs or chest x-rays 6 skilled nursing facility bed search underway as she has no assistance at home short-term facility is recommended    LOS: 6 days    John Giovanni PA-C Pager [phone] 09/26/2022

## 2022-09-26 NOTE — Progress Notes (Signed)
Mobility Specialist Progress Note:   09/26/22 1339  Mobility  Activity Ambulated with assistance in hallway  Level of Assistance Standby assist, set-up cues, supervision of patient - no hands on  Assistive Device None  Distance Ambulated (ft) 30 ft  Activity Response Tolerated well  $Mobility charge 1 Mobility   Pt received really slid down in chair asking to use the bathroom. No complaints of pain. Pt declining hallway ambulation d/t having issues with loose stool. Left in bed with call bell in reach and all needs met.  [facility] Surveyor, mining Chat only

## 2022-09-27 ENCOUNTER — Ambulatory Visit: Payer: Medicare Other

## 2022-09-27 DIAGNOSIS — J90 Pleural effusion, not elsewhere classified: Secondary | ICD-10-CM | POA: Diagnosis not present

## 2022-09-27 DIAGNOSIS — E118 Type 2 diabetes mellitus with unspecified complications: Secondary | ICD-10-CM | POA: Diagnosis not present

## 2022-09-27 DIAGNOSIS — M6281 Muscle weakness (generalized): Secondary | ICD-10-CM | POA: Diagnosis not present

## 2022-09-27 DIAGNOSIS — J984 Other disorders of lung: Secondary | ICD-10-CM | POA: Diagnosis not present

## 2022-09-27 DIAGNOSIS — I351 Nonrheumatic aortic (valve) insufficiency: Secondary | ICD-10-CM | POA: Diagnosis not present

## 2022-09-27 DIAGNOSIS — Z951 Presence of aortocoronary bypass graft: Secondary | ICD-10-CM | POA: Diagnosis not present

## 2022-09-27 DIAGNOSIS — I10 Essential (primary) hypertension: Secondary | ICD-10-CM | POA: Diagnosis not present

## 2022-09-27 DIAGNOSIS — E113513 Type 2 diabetes mellitus with proliferative diabetic retinopathy with macular edema, bilateral: Secondary | ICD-10-CM | POA: Diagnosis not present

## 2022-09-27 DIAGNOSIS — E785 Hyperlipidemia, unspecified: Secondary | ICD-10-CM | POA: Diagnosis not present

## 2022-09-27 DIAGNOSIS — Z9889 Other specified postprocedural states: Secondary | ICD-10-CM | POA: Diagnosis not present

## 2022-09-27 DIAGNOSIS — R0602 Shortness of breath: Secondary | ICD-10-CM | POA: Diagnosis not present

## 2022-09-27 DIAGNOSIS — I25119 Atherosclerotic heart disease of native coronary artery with unspecified angina pectoris: Secondary | ICD-10-CM | POA: Diagnosis not present

## 2022-09-27 DIAGNOSIS — I2510 Atherosclerotic heart disease of native coronary artery without angina pectoris: Secondary | ICD-10-CM | POA: Diagnosis not present

## 2022-09-27 DIAGNOSIS — E119 Type 2 diabetes mellitus without complications: Secondary | ICD-10-CM | POA: Diagnosis not present

## 2022-09-27 DIAGNOSIS — R2681 Unsteadiness on feet: Secondary | ICD-10-CM | POA: Diagnosis not present

## 2022-09-27 LAB — GLUCOSE, CAPILLARY
Glucose-Capillary: 110 mg/dL — ABNORMAL HIGH (ref 70–99)
Glucose-Capillary: 118 mg/dL — ABNORMAL HIGH (ref 70–99)
Glucose-Capillary: 119 mg/dL — ABNORMAL HIGH (ref 70–99)

## 2022-09-27 MED ORDER — TRAMADOL HCL 50 MG PO TABS: 50.0000 mg | ORAL_TABLET | Freq: Four times a day (QID) | ORAL | 0 refills | Status: AC | PRN

## 2022-09-27 MED ORDER — ASPIRIN 325 MG PO TBEC: 325.0000 mg | DELAYED_RELEASE_TABLET | Freq: Every day | ORAL | Status: DC

## 2022-09-27 MED ORDER — METOPROLOL TARTRATE 25 MG PO TABS: 25.0000 mg | ORAL_TABLET | Freq: Two times a day (BID) | ORAL | Status: DC

## 2022-09-27 NOTE — Care Management Important Message (Signed)
Important Message  Patient Details  Name: Sheila Keller MRN: [id number] Date of Birth: 1946/06/06   Medicare Important Message Given:  Yes     [name] 09/27/2022, 11:36 AM

## 2022-09-27 NOTE — Progress Notes (Signed)
[address]       [place] [address]             [phone]      7 Days Post-Op Procedure(s) (LRB): CORONARY ARTERY BYPASS GRAFTING (CABG) X3 USING ENDOSCOPICALLY HARVESTED RIGHT GREATER SAPHENOUS VEIN (N/A) TRANSESOPHAGEAL ECHOCARDIOGRAM (TEE) (N/A) Subjective: Had several loose stools yesterday and a lot of sweating.  Says she feels better today. Denies abd pain.   Objective: Vital signs in last 24 hours: Temp:  [97.7 F (36.5 C)-97.9 F (36.6 C)] 97.9 F (36.6 C) (10/23 0015) Pulse Rate:  [91-108] 93 (10/23 0359) Cardiac Rhythm: Normal sinus rhythm (10/23 0359) Resp:  [17-20] 19 (10/23 0359) BP: (113-136)/(67-98) 113/67 (10/23 0359) SpO2:  [96 %-99 %] 96 % (10/23 0359) Weight:  [69 kg] 69 kg (10/23 0705)    Intake/Output from previous day: 10/22 0701 - 10/23 0700 In: 640 [P.O.:640] Out: 1100 [Urine:1100] Intake/Output this shift: No intake/output data recorded.  General appearance: alert, cooperative, and no distress Heart: regular rate and rhythm  Lungs: Breath sounds are clear, good sats on RA.  Abdomen: soft, NT. Extremities: No edema Wound: sternal and right leg incisions healing well without evidence of infection  Lab Results: No results for input(s): "WBC", "HGB", "HCT", "PLT" in the last 72 hours. BMET:  Recent Labs    09/26/22 0959  NA 139  K 3.6  CL 110  CO2 20*  GLUCOSE 142*  BUN 18  CREATININE 0.89  CALCIUM 8.5*     PT/INR: No results for input(s): "LABPROT", "INR" in the last 72 hours. ABG    Component Value Date/Time   PHART 7.321 (L) 09/20/2022 2000   HCO3 20.6 09/20/2022 2000   TCO2 22 09/20/2022 2000   ACIDBASEDEF 5.0 (H) 09/20/2022 2000   O2SAT 99 09/20/2022 2000   CBG (last 3)  Recent Labs    09/26/22 1659 09/26/22 2104 09/27/22 0656  GLUCAP 108* 135* 110*     Meds Scheduled Meds:  aspirin EC  325 mg Oral Daily   Or   aspirin  324 mg Per Tube Daily   atorvastatin  80 mg Oral Daily    bisacodyl  10 mg Oral Daily   Or   bisacodyl  10 mg Rectal Daily   Chlorhexidine Gluconate Cloth  6 each Topical Daily   docusate sodium  200 mg Oral Daily   empagliflozin  25 mg Oral Daily   insulin aspart  0-9 Units Subcutaneous TID WC   linagliptin  5 mg Oral Daily   metFORMIN  1,000 mg Oral BID WC   metoprolol tartrate  25 mg Oral BID   pantoprazole  40 mg Oral Daily   sodium chloride flush  3 mL Intravenous Q12H   Continuous Infusions: PRN Meds:.metoprolol tartrate, ondansetron (ZOFRAN) IV, mouth rinse, oxyCODONE, sodium chloride flush, traMADol  Xrays No results found.  Assessment/Plan: S/P Procedure(s) (LRB): CORONARY ARTERY BYPASS GRAFTING (CABG) X3 USING ENDOSCOPICALLY HARVESTED RIGHT GREATER SAPHENOUS VEIN (N/A) TRANSESOPHAGEAL ECHOCARDIOGRAM (TEE) (N/A)  -POD #7 CABG x 3, preserve EF. Stable VS and cardiac rhythm.  On ASA, statin, BB. Progressing slowly with mobility. Was loving alone prior to admission and would like to transition to a SNF prior to returning home. TOC team working on this.   -Type 2 DM- well controlled on Jardiance, Tradjenta, and metformin. Monitoring.  -Volume excess- resolved, back down to pre-op Wt this morning. No peripheral edema. Stopping Lasix.  -Disposition-ready for SNF when bed available. Continue  working with PT.     LOS: 7 days    Antony Odea PA-C Pager [phone] 09/27/2022

## 2022-09-27 NOTE — Progress Notes (Signed)
CARDIAC REHAB PHASE I     Pt eating lunch feeling well today. Plans for discharge to SNF today. Reviewed post op education all questions and concerns addressed.   [phone]  Vanessa Barbara, RN BSN 09/27/2022 12:00 PM

## 2022-09-27 NOTE — Progress Notes (Signed)
[facility] and gave report to RN. All questions answered. Son to transport patient to [facility] via [facility]. Payton Emerald, RN

## 2022-09-27 NOTE — Discharge Instructions (Signed)

## 2022-09-27 NOTE — TOC Transition Note (Signed)
Transition of Care [facility]) - CM/SW Discharge Note   Patient Details  Name: Sheila Keller MRN: [id number] Date of Birth: Jun 26, 1946  Transition of Care [facility]) CM/SW Contact:  Bethann Berkshire, [place] Phone Number: 09/27/2022, 2:22 PM   Clinical Narrative:     Patient will DC to: [facility] SNF Anticipated DC date: 09/27/22 Family notified: Son Medical laboratory scientific officer by: Son Marlou Sa   Per MD patient ready for DC to [facility]. RN, patient, patient's family, and facility notified of DC. Discharge Summary and FL2 sent to facility. RN to call report prior to discharge [phone]). DC packet on chart. Pt's son will transport to SNF.   CSW will sign off for now as social work intervention is no longer needed. Please consult [place] again if new needs arise.   Final next level of care: Skilled Nursing Facility Barriers to Discharge: No Barriers Identified   Patient Goals and CMS Choice Patient states their goals for this hospitalization and ongoing recovery are:: SNF CMS Medicare.gov Compare Post Acute Care list provided to:: Patient Choice offered to / list presented to : Patient  Discharge Placement              Patient chooses bed at: [facility] Patient to be transferred to facility by: Son Marlou Sa Name of family member notified: Joslyn Devon Patient and family notified of of transfer: 09/27/22  Discharge Plan and Services In-house Referral: Clinical Social Work Discharge Planning Services: CM Consult Post Acute Care Choice: IP Rehab            DME Agency: NA                  Social Determinants of Health (SDOH) Interventions     Readmission Risk Interventions     No data to display

## 2022-09-27 NOTE — Progress Notes (Signed)
Mobility Specialist Progress Note:   09/27/22 0947  Mobility  Activity Ambulated with assistance in hallway  Level of Assistance Standby assist, set-up cues, supervision of patient - no hands on  Assistive Device None  Distance Ambulated (ft) 60 ft  Activity Response Tolerated well  $Mobility charge 1 Mobility   Pt received in chair willing to participate in mobility. No complaints of pain. Left in chair with call bell in reach and all needs met.   [facility] Surveyor, mining Chat only

## 2022-09-27 NOTE — TOC Progression Note (Signed)
Transition of Care [facility]) - Progression Note    Patient Details  Name: JULYA ALIOTO MRN: [id number] Date of Birth: August 05, 1946  Transition [facility] Care [facility] CM/SW [place], [place] Phone Number: 09/27/2022, 12:19 PM  Clinical Narrative:     Josem Kaufmann approved 10/23 -- 10/25. Josem Kaufmann ID: [id number]  CSW updated [facility] that Josem Kaufmann is approved. They are able to accept pt today once DC summary received.   CSW called and updated son. He plans to transport pt at DC and is requesting a time to pick up pt. CSW explained that attending team is in OR this morning and will complete DC tasks once able. CSW provided estimated DC time of 330 for him to transport pt though will update son if DC will be later.    Expected Discharge Plan: Skilled Nursing Facility Barriers to Discharge: SNF Pending discharge orders, SNF Pending discharge summary  Expected Discharge Plan and Services Expected Discharge Plan: [place] In-house Referral: Clinical Social Work Discharge Planning Services: CM Consult Post Acute Care Choice: IP Rehab Living arrangements for the past 2 months: Single Family Home                   DME Agency: NA                   Social Determinants of Health (SDOH) Interventions    Readmission Risk Interventions     No data to display

## 2022-09-29 DIAGNOSIS — I10 Essential (primary) hypertension: Secondary | ICD-10-CM | POA: Diagnosis not present

## 2022-09-29 DIAGNOSIS — E785 Hyperlipidemia, unspecified: Secondary | ICD-10-CM | POA: Diagnosis not present

## 2022-09-29 DIAGNOSIS — E113513 Type 2 diabetes mellitus with proliferative diabetic retinopathy with macular edema, bilateral: Secondary | ICD-10-CM | POA: Diagnosis not present

## 2022-09-29 DIAGNOSIS — E118 Type 2 diabetes mellitus with unspecified complications: Secondary | ICD-10-CM | POA: Diagnosis not present

## 2022-09-29 DIAGNOSIS — Z951 Presence of aortocoronary bypass graft: Secondary | ICD-10-CM | POA: Diagnosis not present

## 2022-09-29 DIAGNOSIS — I25119 Atherosclerotic heart disease of native coronary artery with unspecified angina pectoris: Secondary | ICD-10-CM | POA: Diagnosis not present

## 2022-09-29 DIAGNOSIS — I351 Nonrheumatic aortic (valve) insufficiency: Secondary | ICD-10-CM | POA: Diagnosis not present

## 2022-09-30 ENCOUNTER — Ambulatory Visit: Payer: Medicare Other | Admitting: Cardiothoracic Surgery

## 2022-09-30 ENCOUNTER — Ambulatory Visit: Payer: Medicare Other

## 2022-10-01 ENCOUNTER — Other Ambulatory Visit: Payer: Self-pay | Admitting: *Deleted

## 2022-10-01 NOTE — Patient Outreach (Signed)
Ms. Zinn recently admitted to [facility].   Facility site visit to [facility] on 09/30/22. Met with SNF social worker who reports Ms. Kube is from home alone. Has supportive sons.  PCP office [facility] has [facility] care management.  Will continue to follow.    Marthenia Rolling, MSN, RN,BSN [place] Acute Care Coordinator [phone] (Direct dial)

## 2022-10-04 DIAGNOSIS — E113513 Type 2 diabetes mellitus with proliferative diabetic retinopathy with macular edema, bilateral: Secondary | ICD-10-CM | POA: Diagnosis not present

## 2022-10-04 DIAGNOSIS — E785 Hyperlipidemia, unspecified: Secondary | ICD-10-CM | POA: Diagnosis not present

## 2022-10-04 DIAGNOSIS — I2510 Atherosclerotic heart disease of native coronary artery without angina pectoris: Secondary | ICD-10-CM | POA: Diagnosis not present

## 2022-10-04 DIAGNOSIS — I10 Essential (primary) hypertension: Secondary | ICD-10-CM | POA: Diagnosis not present

## 2022-10-06 ENCOUNTER — Other Ambulatory Visit: Payer: Self-pay | Admitting: Thoracic Surgery (Cardiothoracic Vascular Surgery)

## 2022-10-06 DIAGNOSIS — Z951 Presence of aortocoronary bypass graft: Secondary | ICD-10-CM

## 2022-10-06 DIAGNOSIS — E785 Hyperlipidemia, unspecified: Secondary | ICD-10-CM | POA: Diagnosis not present

## 2022-10-06 DIAGNOSIS — I10 Essential (primary) hypertension: Secondary | ICD-10-CM | POA: Diagnosis not present

## 2022-10-06 DIAGNOSIS — I25119 Atherosclerotic heart disease of native coronary artery with unspecified angina pectoris: Secondary | ICD-10-CM | POA: Diagnosis not present

## 2022-10-06 DIAGNOSIS — E118 Type 2 diabetes mellitus with unspecified complications: Secondary | ICD-10-CM | POA: Diagnosis not present

## 2022-10-06 DIAGNOSIS — I351 Nonrheumatic aortic (valve) insufficiency: Secondary | ICD-10-CM | POA: Diagnosis not present

## 2022-10-07 ENCOUNTER — Ambulatory Visit (INDEPENDENT_AMBULATORY_CARE_PROVIDER_SITE_OTHER): Payer: Self-pay | Admitting: Cardiothoracic Surgery

## 2022-10-07 ENCOUNTER — Other Ambulatory Visit: Payer: Self-pay | Admitting: Cardiothoracic Surgery

## 2022-10-07 ENCOUNTER — Ambulatory Visit
Admission: RE | Admit: 2022-10-07 | Discharge: 2022-10-07 | Disposition: A | Discharge status: Home / Self Care | Payer: Medicare Other | Source: Ambulatory Visit | Attending: Cardiothoracic Surgery | Admitting: Cardiothoracic Surgery

## 2022-10-07 VITALS — BP 100/68 | HR 90 | Resp 20 | Ht 63.5 in | Wt 151.4 lb

## 2022-10-07 DIAGNOSIS — Z951 Presence of aortocoronary bypass graft: Secondary | ICD-10-CM

## 2022-10-07 DIAGNOSIS — R0602 Shortness of breath: Secondary | ICD-10-CM | POA: Diagnosis not present

## 2022-10-07 NOTE — Progress Notes (Signed)
S/p CAB x 3 on 09/20/22  Two weeks out and looks very well.   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   Went to a nursing facility after surgery as she lives alone.   Is headed home from nursing facility Saturday.  I removed 2 sutures, 2 more to remove next week.  Patient reports easy to obtain rides to [facility] for below  O: Moderate left pleural effusion on XR  Vitals:   10/07/22 1151  Weight: 151 lb 6.4 oz (68.7 kg)  Height: 5' 3.5" (1.613 m)     A/P  11F s/p CAB x 3  IR drainage of left pleural effusion. (For [facility] - patient is only on ASA.)  See me back in 1 week.

## 2022-10-08 ENCOUNTER — Ambulatory Visit: Payer: Medicare Other | Admitting: Cardiology

## 2022-10-08 ENCOUNTER — Ambulatory Visit (HOSPITAL_COMMUNITY)
Admission: RE | Admit: 2022-10-08 | Discharge: 2022-10-08 | Disposition: A | Discharge status: Home / Self Care | Payer: Medicare Other | Source: Ambulatory Visit | Attending: Cardiothoracic Surgery | Admitting: Cardiothoracic Surgery

## 2022-10-08 ENCOUNTER — Other Ambulatory Visit (HOSPITAL_COMMUNITY): Payer: Self-pay | Admitting: Physician Assistant

## 2022-10-08 ENCOUNTER — Ambulatory Visit (HOSPITAL_COMMUNITY)
Admission: RE | Admit: 2022-10-08 | Discharge: 2022-10-08 | Disposition: A | Discharge status: Home / Self Care | Payer: Medicare Other | Source: Ambulatory Visit | Attending: Physician Assistant | Admitting: Physician Assistant

## 2022-10-08 DIAGNOSIS — J984 Other disorders of lung: Secondary | ICD-10-CM | POA: Diagnosis not present

## 2022-10-08 DIAGNOSIS — J90 Pleural effusion, not elsewhere classified: Secondary | ICD-10-CM

## 2022-10-08 DIAGNOSIS — Z951 Presence of aortocoronary bypass graft: Secondary | ICD-10-CM

## 2022-10-08 DIAGNOSIS — I2510 Atherosclerotic heart disease of native coronary artery without angina pectoris: Secondary | ICD-10-CM | POA: Diagnosis not present

## 2022-10-08 DIAGNOSIS — E113513 Type 2 diabetes mellitus with proliferative diabetic retinopathy with macular edema, bilateral: Secondary | ICD-10-CM | POA: Diagnosis not present

## 2022-10-08 DIAGNOSIS — Z9889 Other specified postprocedural states: Secondary | ICD-10-CM | POA: Diagnosis not present

## 2022-10-08 DIAGNOSIS — I10 Essential (primary) hypertension: Secondary | ICD-10-CM | POA: Diagnosis not present

## 2022-10-08 HISTORY — PX: IR THORACENTESIS ASP PLEURAL SPACE W/IMG GUIDE: IMG5380

## 2022-10-08 NOTE — Procedures (Signed)
PROCEDURE SUMMARY:  Successful US guided left thoracentesis. Yielded 900cc of bloody fluid. Pt tolerated procedure well. No immediate complications.  Specimen not sent for labs. CXR ordered.  EBL < 5 mL  Dashana Guizar PA-C 10/08/2022 2:24 PM

## 2022-10-11 ENCOUNTER — Telehealth: Payer: Self-pay | Admitting: Cardiology

## 2022-10-11 ENCOUNTER — Telehealth: Payer: Self-pay | Admitting: *Deleted

## 2022-10-11 DIAGNOSIS — Z7982 Long term (current) use of aspirin: Secondary | ICD-10-CM | POA: Diagnosis not present

## 2022-10-11 DIAGNOSIS — E7800 Pure hypercholesterolemia, unspecified: Secondary | ICD-10-CM | POA: Diagnosis not present

## 2022-10-11 DIAGNOSIS — Z7984 Long term (current) use of oral hypoglycemic drugs: Secondary | ICD-10-CM | POA: Diagnosis not present

## 2022-10-11 DIAGNOSIS — E119 Type 2 diabetes mellitus without complications: Secondary | ICD-10-CM | POA: Diagnosis not present

## 2022-10-11 DIAGNOSIS — I119 Hypertensive heart disease without heart failure: Secondary | ICD-10-CM | POA: Diagnosis not present

## 2022-10-11 DIAGNOSIS — N3020 Other chronic cystitis without hematuria: Secondary | ICD-10-CM | POA: Diagnosis not present

## 2022-10-11 DIAGNOSIS — I350 Nonrheumatic aortic (valve) stenosis: Secondary | ICD-10-CM | POA: Diagnosis not present

## 2022-10-11 DIAGNOSIS — Z951 Presence of aortocoronary bypass graft: Secondary | ICD-10-CM | POA: Diagnosis not present

## 2022-10-11 DIAGNOSIS — E118 Type 2 diabetes mellitus with unspecified complications: Secondary | ICD-10-CM | POA: Diagnosis not present

## 2022-10-11 DIAGNOSIS — I2510 Atherosclerotic heart disease of native coronary artery without angina pectoris: Secondary | ICD-10-CM | POA: Diagnosis not present

## 2022-10-11 DIAGNOSIS — M6281 Muscle weakness (generalized): Secondary | ICD-10-CM | POA: Diagnosis not present

## 2022-10-11 DIAGNOSIS — M8580 Other specified disorders of bone density and structure, unspecified site: Secondary | ICD-10-CM | POA: Diagnosis not present

## 2022-10-11 DIAGNOSIS — R262 Difficulty in walking, not elsewhere classified: Secondary | ICD-10-CM | POA: Diagnosis not present

## 2022-10-11 DIAGNOSIS — Z48812 Encounter for surgical aftercare following surgery on the circulatory system: Secondary | ICD-10-CM | POA: Diagnosis not present

## 2022-10-11 NOTE — Telephone Encounter (Signed)
Patient called stating she just returned home from nursing home after having open heart surgery. Reports she has not taking Actonel 150 mg tablet in 2 months, she can remember to take pill once a month. She also reports it makes her mouth very dry, its too expensive also. She asked if another medication can be prescribed? Please advise

## 2022-10-11 NOTE — Telephone Encounter (Addendum)
Patient said she will stop treatment for her bones and reassess with dexa in 2025.

## 2022-10-11 NOTE — Telephone Encounter (Signed)
Spoke with pt, aware losartan was stopped at discharge from the hospital. Follow up scheduled

## 2022-10-11 NOTE — Telephone Encounter (Signed)
Pt c/o medication issue:  1. Name of Medication: losartan potassium 100 mg 1 tablet every day  2. How are you currently taking this medication (dosage and times per day)? Took it yesterday, not today  3. Are you having a reaction (difficulty breathing--STAT)? no  4. What is your medication issue? Patient states the losartan was not on her list of medications from the nursing home. She says she was just released from there and did not realize it was not on her paper and took 1 losartan yesterday. She says Dr. Gardiner Rhyme had prescribed her the medication. She says the nursing home also did not have the medication she puts under her tongue in case of a heart attack. She says she is not sure is she was supposed to ask Dr. Gardiner Rhyme or her surgeon these questions. She says she will also have a list of her medications faxed to the office today. She also wants to know if she can be worked in to see Dr. Gardiner Rhyme and refused to see a APP. She says she is supposed to see him as soon as possible, but the first available was not until February.

## 2022-10-11 NOTE — Telephone Encounter (Signed)
Fosamax 70 mg po weekly may be an alternative treatment for osteopenia and osteoporosis.  Both medications are very similar, but this medication may be less expensive.  I suspect this may also cause her to have similar symptoms of dry mouth.  (Both medications can cause acid reflux.)  Her other option is to stop treatment for her osteopenia and reassess with her next bone density in 2025.   I wish her well in her recovery from heart surgery!

## 2022-10-12 DIAGNOSIS — Z48812 Encounter for surgical aftercare following surgery on the circulatory system: Secondary | ICD-10-CM | POA: Diagnosis not present

## 2022-10-12 DIAGNOSIS — Z7982 Long term (current) use of aspirin: Secondary | ICD-10-CM | POA: Diagnosis not present

## 2022-10-12 DIAGNOSIS — E119 Type 2 diabetes mellitus without complications: Secondary | ICD-10-CM | POA: Diagnosis not present

## 2022-10-12 DIAGNOSIS — E7800 Pure hypercholesterolemia, unspecified: Secondary | ICD-10-CM | POA: Diagnosis not present

## 2022-10-12 DIAGNOSIS — I2510 Atherosclerotic heart disease of native coronary artery without angina pectoris: Secondary | ICD-10-CM | POA: Diagnosis not present

## 2022-10-12 DIAGNOSIS — R262 Difficulty in walking, not elsewhere classified: Secondary | ICD-10-CM | POA: Diagnosis not present

## 2022-10-12 DIAGNOSIS — I350 Nonrheumatic aortic (valve) stenosis: Secondary | ICD-10-CM | POA: Diagnosis not present

## 2022-10-12 DIAGNOSIS — I119 Hypertensive heart disease without heart failure: Secondary | ICD-10-CM | POA: Diagnosis not present

## 2022-10-12 DIAGNOSIS — E118 Type 2 diabetes mellitus with unspecified complications: Secondary | ICD-10-CM | POA: Diagnosis not present

## 2022-10-12 DIAGNOSIS — M8580 Other specified disorders of bone density and structure, unspecified site: Secondary | ICD-10-CM | POA: Diagnosis not present

## 2022-10-12 DIAGNOSIS — N3020 Other chronic cystitis without hematuria: Secondary | ICD-10-CM | POA: Diagnosis not present

## 2022-10-12 DIAGNOSIS — Z951 Presence of aortocoronary bypass graft: Secondary | ICD-10-CM | POA: Diagnosis not present

## 2022-10-12 DIAGNOSIS — M6281 Muscle weakness (generalized): Secondary | ICD-10-CM | POA: Diagnosis not present

## 2022-10-12 DIAGNOSIS — Z7984 Long term (current) use of oral hypoglycemic drugs: Secondary | ICD-10-CM | POA: Diagnosis not present

## 2022-10-12 NOTE — Progress Notes (Unsigned)
Cardiology Office Note:    Date:  08/26/2022   ID:  Sheila Keller, DOB 01-Feb-1946, MRN [id number]  PCP:  Maury Dus, MD  Cardiologist:  None  Electrophysiologist:  None   Referring MD: Maury Dus, MD   Chief Complaint  Patient presents with   Coronary Artery Disease    History of Present Illness:    Sheila Keller is a 76 y.o. female with a hx of CAD status post CABG, aortic stenosis, hypertension, diabetes, hyperlipidemia who presents for follow-up. TTE on 03/05/2020 showed hyperdynamic LV systolic function, mild concentric LVH, grade 1 diastolic dysfunction, normal RV function, normal PASP, mild to moderate aortic stenosis.  Echocardiogram 03/17/2021 showed LVEF 70 to 62%, grade 1 diastolic dysfunction, normal RV function, moderate aortic stenosis.  Echocardiogram 03/30/2022 showed EF 70 to 70%, grade 1 diastolic dysfunction, LV mid cavitary gradient 22 mmHg (43 mmHg with Valsalva), normal RV function, moderate aortic stenosis (V-max 3 m/s, mean gradient 24 mmHg, AVA 0.8 cm, DI 0.34).  Cardiac PET scan on 08/24/2022 showed high risk findings, including lateral ischemia, decreased EF with stress, TID, severe three-vessel coronary calcifications, though absolute blood flows were normal.  LHC 09/01/2022 showed 80% distal left main stenosis.  She underwent CABG x3 on 09/20/2022 (LIMA to LAD, SVG-OM1, SVG-OM 2).   Since last clinic vist,  she reports has been doing okay.  Denies any chest pain or dyspnea.  No lightheadedness, syncope, lower extremity edema.   Past Medical History:  Diagnosis Date   Breast lump 08/2017   no cancer/ [facility]   Chronic cystitis    Diabetes mellitus    Elevated cholesterol    Heart murmur    Aortic valve stenosis.  Sees cardiologist with [facility]   Hypertension    Mild dysplasia of cervix (CIN I)    Osteopenia 02/2018   T score -1.8 FRAX 11% / 2.2%    Past Surgical History:  Procedure Laterality Date   BREAST BIOPSY Left     CATARACT EXTRACTION Bilateral    x2    CERVICAL BIOPSY  W/ LOOP ELECTRODE EXCISION  2009   CHOLECYSTECTOMY     COLPOSCOPY     DILATION AND CURETTAGE OF UTERUS     TUBAL LIGATION     vocal cord surgery     nodules excised    Current Medications: Current Meds  Medication Sig   ALPRAZolam (XANAX) 0.5 MG tablet    aspirin 81 MG chewable tablet Chew 81 mg by mouth every morning.   atorvastatin (LIPITOR) 80 MG tablet Take 1 tablet (80 mg total) by mouth daily.   Cholecalciferol (VITAMIN D3) 2000 units TABS Take 1 capsule by mouth every morning.   empagliflozin (JARDIANCE) 25 MG TABS tablet Take 25 mg by mouth daily.   glucose blood (ONETOUCH VERIO) test strip for use when checking blood sugars   losartan (COZAAR) 100 MG tablet Take 1 tablet (100 mg total) by mouth daily.   metFORMIN (GLUCOPHAGE) 500 MG tablet Take 2 tablets by mouth 2 (two) times daily.   risedronate (ACTONEL) 150 MG tablet PLEASE SEE ATTACHED FOR DETAILED DIRECTIONS   sitaGLIPtin (JANUVIA) 100 MG tablet Take 100 mg by mouth daily.     Allergies:   Bee venom, Dapagliflozin, and Penicillins   Social History   Socioeconomic History   Marital status: Divorced    Spouse name: Not on file   Number of children: Not on file   Years of education: Not on file  Highest education level: Not on file  Occupational History   Not on file  Tobacco Use   Smoking status: Never   Smokeless tobacco: Never  Vaping Use   Vaping Use: Never used  Substance and Sexual Activity   Alcohol use: No    Alcohol/week: 0.0 standard drinks of alcohol   Drug use: No   Sexual activity: Never    Birth control/protection: Post-menopausal, Surgical    Comment: First sexual encounter at age 19. Less than 5 partners in her life.  Other Topics Concern   Not on file  Social History Narrative   Not on file   Social Determinants of Health   Financial Resource Strain: Not on file  Food Insecurity: Not on file  Transportation Needs: Not on  file  Physical Activity: Not on file  Stress: Not on file  Social Connections: Not on file     Family History: The patient's family history includes Breast cancer in her maternal aunt and mother; Diabetes in her mother and son; Heart attack in her son; Heart disease in her mother; Hypertension in her mother; Lung cancer in her father; Stroke in her mother.  ROS:   Please see the history of present illness.    All other systems reviewed and are negative.  EKGs/Labs/Other Studies Reviewed:    The following studies were reviewed today:   EKG:    08/26/2022: Sinus tachycardia, rate 109, no ST abnormalities 04/06/22:  Normal sinus rhythm, rate 101, no ST abnormalities 02/18/2020: normal sinus rhythm, rate 108, right axis deviation, no ST abnormalities, Q waves in III, aVF 04/01/2021: Sinus rhythm. Rate 88 bpm. No ST abnormalities.  Recent Labs: 06/10/2022: BUN 17; Creatinine, Ser 0.80; Potassium 4.5; Sodium 141  Recent Lipid Panel No results found for: "CHOL", "TRIG", "HDL", "CHOLHDL", "VLDL", "LDLCALC", "LDLDIRECT"  Physical Exam:    VS:  BP 118/80   Pulse (!) 109   Ht '5\' 3"'$  (1.6 m)   Wt 155 lb (70.3 kg)   LMP  (LMP Unknown)   SpO2 97%   BMI 27.46 kg/m     Wt Readings from Last 3 Encounters:  08/26/22 155 lb (70.3 kg)  07/21/22 150 lb (68 kg)  06/03/22 156 lb 6.4 oz (70.9 kg)     GEN:  in no acute distress HEENT: Normal NECK: No JVD CARDIAC: RRR, 3/6 systolic heart murmur loudest at the RUSB RESPIRATORY:  Clear to auscultation without rales, wheezing or rhonchi  ABDOMEN: Soft, non-tender, non-distended MUSCULOSKELETAL:  No edema; No deformity  SKIN: Warm and dry NEUROLOGIC:  Alert and oriented x 3 PSYCHIATRIC:  Normal affect   ASSESSMENT:    1. Coronary artery disease involving native coronary artery of native heart without angina pectoris   2. Pre-op evaluation   3. Aortic valve stenosis, etiology of cardiac valve disease unspecified   4. Essential hypertension    5. Hyperlipidemia, unspecified hyperlipidemia type   6. Pre-procedure lab exam     PLAN:    CAD: Calcium score 1176 (95th percentile) on 05/25/2022.  Denies any chest pain or dyspnea.  Cardiac PET scan on 08/24/2022 showed high risk findings, including lateral ischemia, decreased EF with stress, TID, severe three-vessel coronary calcifications, though absolute blood flows were normal. LHC 09/01/2022 showed 80% distal left main stenosis.  She underwent CABG x3 on 09/20/2022 (LIMA to LAD, SVG-OM1, SVG-OM 2). -Continue aspirin  -Continue atorvastatin 80 mg daily  Aortic stenosis: Echocardiogram 03/17/2021 showed moderate aortic stenosis (V-max 3 m/s, mean gradient 20 mmHg, AVA  1.0 cm, DI 0.3).  Echocardiogram 03/30/2022 showed moderate aortic stenosis (V-max 3.0 m/s, mean gradient 24 mmHg, AVA 0.8 cm, DI 0.34) -Unclear why did not undergo AVR at time of CABG, will continue to monitor  Pleural effusion: Underwent thoracentesis 10/08/2022  Hypertension: On metoprolol 25 mg twice daily  Hyperlipidemia: On atorvastatin 40 mg daily,  LDL 72 on 01/27/2022.  Calcium score 1176 on 05/25/2022.  Atorvastatin increased to 80 mg daily.  Check lipid panel  Type 2 diabetes: On Metformin, Januvia, Jardiance.  A1c 7.4 on 08/2022   RTC in***    Medication Adjustments/Labs and Tests Ordered: Current medicines are reviewed at length with the patient today.  Concerns regarding medicines are outlined above.  Orders Placed This Encounter  Procedures   Basic metabolic panel   CBC   EKG 12-Lead   No orders of the defined types were placed in this encounter.   Patient Instructions  [place] A DEPT OF [place] A DEPT OF [place]. [facility] [id number] [place] [address] Dept: [phone] Loc: [phone]  MARGEAUX SWANTEK  08/26/2022  You are scheduled for a Cardiac Catheterization on Wednesday,  September 27 with Dr. Daneen Schick.  1. Please arrive at the [facility] (Main Entrance A) at [facility]: [address] [place], [place] [address] at 6:30 AM (This time is two hours before your procedure to ensure your preparation). Free valet parking service is available.   Special note: Every effort is made to have your procedure done on time. Please understand that emergencies sometimes delay scheduled procedures.  2. Diet: Do not eat solid foods after midnight.  The patient may have clear liquids until 5am upon the day of the procedure.  3. Labs: today (BMET, CBC)  4. Medication instructions in preparation for your procedure:   Contrast Allergy: No  Hold Jardiance AM of procedure  Hold Januvia AM of procedure   Do not take Diabetes Med Glucophage (Metformin) on the day of the procedure and HOLD 48 HOURS AFTER THE PROCEDURE.  On the morning of your procedure, take your Aspirin 81 mg and any morning medicines NOT listed above.  You may use sips of water.  5. Plan for one night stay--bring personal belongings. 6. Bring a current list of your medications and current insurance cards. 7. You MUST have a responsible person to drive you home. 8. Someone MUST be with you the first 24 hours after you arrive home or your discharge will be delayed. 9. Please wear clothes that are easy to get on and off and wear slip-on shoes.  Thank you for allowing [place] to care for you!   -- [place] [facility]   Keep follow up as scheduled with Dr. Gardiner Rhyme       Signed, Donato Heinz, MD  08/26/2022 6:43 PM    [place]

## 2022-10-13 ENCOUNTER — Encounter: Payer: Self-pay | Admitting: Cardiology

## 2022-10-13 ENCOUNTER — Other Ambulatory Visit: Payer: Self-pay | Admitting: Cardiothoracic Surgery

## 2022-10-13 ENCOUNTER — Ambulatory Visit: Payer: Medicare Other | Attending: Cardiology | Admitting: Cardiology

## 2022-10-13 VITALS — BP 134/81 | HR 78 | Ht 63.5 in | Wt 153.8 lb

## 2022-10-13 DIAGNOSIS — I350 Nonrheumatic aortic (valve) stenosis: Secondary | ICD-10-CM | POA: Diagnosis not present

## 2022-10-13 DIAGNOSIS — Z951 Presence of aortocoronary bypass graft: Secondary | ICD-10-CM | POA: Diagnosis not present

## 2022-10-13 DIAGNOSIS — I10 Essential (primary) hypertension: Secondary | ICD-10-CM | POA: Diagnosis not present

## 2022-10-13 DIAGNOSIS — E785 Hyperlipidemia, unspecified: Secondary | ICD-10-CM

## 2022-10-13 DIAGNOSIS — J90 Pleural effusion, not elsewhere classified: Secondary | ICD-10-CM

## 2022-10-13 NOTE — Patient Instructions (Signed)
Medication Instructions:  Your physician recommends that you continue on your current medications as directed. Please refer to the Current Medication list given to you today.  *If you need a refill on your cardiac medications before your next appointment, please call your pharmacy*   Lab Work: CMET, CBC today  If you have labs (blood work) drawn today and your tests are completely normal, you will receive your results only by: [place] (if you have MyChart) OR A paper copy in the mail If you have any lab test that is abnormal or we need to change your treatment, we will call you to review the results.  Follow-Up: At [facility], you and your health needs are our priority.  As part of our continuing mission to provide you with exceptional heart care, we have created designated Provider Care Teams.  These Care Teams include your primary Cardiologist (physician) and Advanced Practice Providers (APPs -  Physician Assistants and Nurse Practitioners) who all work together to provide you with the care you need, when you need it.  We recommend signing up for the patient portal called "MyChart".  Sign up information is provided on this After Visit Summary.  MyChart is used to connect with patients for Virtual Visits (Telemedicine).  Patients are able to view lab/test results, encounter notes, upcoming appointments, etc.  Non-urgent messages can be sent to your provider as well.   To learn more about what you can do with MyChart, go to [email or web address].    Your next appointment:   3 month(s)  The format for your next appointment:   In Person  Provider:   Dr. Gardiner Rhyme

## 2022-10-14 ENCOUNTER — Ambulatory Visit (INDEPENDENT_AMBULATORY_CARE_PROVIDER_SITE_OTHER): Payer: Self-pay | Admitting: Cardiothoracic Surgery

## 2022-10-14 ENCOUNTER — Ambulatory Visit
Admission: RE | Admit: 2022-10-14 | Discharge: 2022-10-14 | Disposition: A | Discharge status: Home / Self Care | Payer: Medicare Other | Source: Ambulatory Visit | Attending: Cardiothoracic Surgery | Admitting: Cardiothoracic Surgery

## 2022-10-14 ENCOUNTER — Other Ambulatory Visit: Payer: Self-pay | Admitting: *Deleted

## 2022-10-14 VITALS — BP 112/73 | HR 88 | Resp 20 | Ht 63.5 in | Wt 153.0 lb

## 2022-10-14 DIAGNOSIS — Z951 Presence of aortocoronary bypass graft: Secondary | ICD-10-CM

## 2022-10-14 DIAGNOSIS — I25119 Atherosclerotic heart disease of native coronary artery with unspecified angina pectoris: Secondary | ICD-10-CM

## 2022-10-14 DIAGNOSIS — R600 Localized edema: Secondary | ICD-10-CM

## 2022-10-14 DIAGNOSIS — R0602 Shortness of breath: Secondary | ICD-10-CM | POA: Diagnosis not present

## 2022-10-14 LAB — COMPREHENSIVE METABOLIC PANEL
ALT: 10 IU/L (ref 0–32)
AST: 21 IU/L (ref 0–40)
Albumin/Globulin Ratio: 1.5 (ref 1.2–2.2)
Albumin: 4 g/dL (ref 3.8–4.8)
Alkaline Phosphatase: 80 IU/L (ref 44–121)
BUN/Creatinine Ratio: 23 (ref 12–28)
BUN: 19 mg/dL (ref 8–27)
Bilirubin Total: 0.4 mg/dL (ref 0.0–1.2)
CO2: 20 mmol/L (ref 20–29)
Calcium: 9.7 mg/dL (ref 8.7–10.3)
Chloride: 106 mmol/L (ref 96–106)
Creatinine, Ser: 0.84 mg/dL (ref 0.57–1.00)
Globulin, Total: 2.6 g/dL (ref 1.5–4.5)
Glucose: 100 mg/dL — ABNORMAL HIGH (ref 70–99)
Potassium: 5.2 mmol/L (ref 3.5–5.2)
Sodium: 144 mmol/L (ref 134–144)
Total Protein: 6.6 g/dL (ref 6.0–8.5)
eGFR: 72 mL/min/{1.73_m2} (ref >59)

## 2022-10-14 LAB — CBC
Hematocrit: 31.5 % — ABNORMAL LOW (ref 34.0–46.6)
Hemoglobin: 9.6 g/dL — ABNORMAL LOW (ref 11.1–15.9)
MCH: 28 pg (ref 26.6–33.0)
MCHC: 30.5 g/dL — ABNORMAL LOW (ref 31.5–35.7)
MCV: 92 fL (ref 79–97)
Platelets: 364 10*3/uL (ref 150–450)
RBC: 3.43 x10E6/uL — ABNORMAL LOW (ref 3.77–5.28)
RDW: 14 % (ref 11.7–15.4)
WBC: 3.4 10*3/uL (ref 3.4–10.8)

## 2022-10-14 LAB — LIPID PANEL
Chol/HDL Ratio: 2.6 ratio (ref 0.0–4.4)
Cholesterol, Total: 110 mg/dL (ref 100–199)
HDL: 43 mg/dL (ref >39)
LDL Chol Calc (NIH): 42 mg/dL (ref 0–99)
Triglycerides: 145 mg/dL (ref 0–149)
VLDL Cholesterol Cal: 25 mg/dL (ref 5–40)

## 2022-10-14 MED ORDER — FUROSEMIDE 20 MG PO TABS: 10.0000 mg | ORAL_TABLET | Freq: Every day | ORAL | 0 refills | Status: DC

## 2022-10-14 NOTE — Progress Notes (Signed)
S/p CAB x 3 on 09/20/22   Three weeks out and looks very well.    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   Is back at home after brief time in nursing facility   Last 2 sutures removed this week.   Had left thora last week - felt sig better afterwards.      O: Mild-Moderate left pleural effusion on XR   Vitals:   10/14/22 1106  BP: 112/73  Pulse: 88  Resp: 20  SpO2: 97%   Alert and oriented.  Incision healing very well 0-1+ LE edema.     A/P  69F s/p CAB x 3  Partial re-accumulation of left pleural fluid.  1 week lasix '10mg'$  qd  See back in clinic 1 week.   Told patient 50% chance she needs another thora. After that, <5% chance she needs another. That is very rare.   May resolve on its own with a week of gentle diuresis.    See me back in 1 week.

## 2022-10-15 ENCOUNTER — Other Ambulatory Visit: Payer: Self-pay | Admitting: *Deleted

## 2022-10-15 DIAGNOSIS — I2510 Atherosclerotic heart disease of native coronary artery without angina pectoris: Secondary | ICD-10-CM | POA: Diagnosis not present

## 2022-10-15 DIAGNOSIS — E1169 Type 2 diabetes mellitus with other specified complication: Secondary | ICD-10-CM | POA: Diagnosis not present

## 2022-10-15 DIAGNOSIS — Z7982 Long term (current) use of aspirin: Secondary | ICD-10-CM | POA: Diagnosis not present

## 2022-10-15 DIAGNOSIS — M6281 Muscle weakness (generalized): Secondary | ICD-10-CM | POA: Diagnosis not present

## 2022-10-15 DIAGNOSIS — Z48812 Encounter for surgical aftercare following surgery on the circulatory system: Secondary | ICD-10-CM | POA: Diagnosis not present

## 2022-10-15 DIAGNOSIS — E119 Type 2 diabetes mellitus without complications: Secondary | ICD-10-CM | POA: Diagnosis not present

## 2022-10-15 DIAGNOSIS — N3020 Other chronic cystitis without hematuria: Secondary | ICD-10-CM | POA: Diagnosis not present

## 2022-10-15 DIAGNOSIS — Z7984 Long term (current) use of oral hypoglycemic drugs: Secondary | ICD-10-CM | POA: Diagnosis not present

## 2022-10-15 DIAGNOSIS — I10 Essential (primary) hypertension: Secondary | ICD-10-CM | POA: Diagnosis not present

## 2022-10-15 DIAGNOSIS — E118 Type 2 diabetes mellitus with unspecified complications: Secondary | ICD-10-CM | POA: Diagnosis not present

## 2022-10-15 DIAGNOSIS — I350 Nonrheumatic aortic (valve) stenosis: Secondary | ICD-10-CM | POA: Diagnosis not present

## 2022-10-15 DIAGNOSIS — M8580 Other specified disorders of bone density and structure, unspecified site: Secondary | ICD-10-CM | POA: Diagnosis not present

## 2022-10-15 DIAGNOSIS — R262 Difficulty in walking, not elsewhere classified: Secondary | ICD-10-CM | POA: Diagnosis not present

## 2022-10-15 DIAGNOSIS — E7800 Pure hypercholesterolemia, unspecified: Secondary | ICD-10-CM | POA: Diagnosis not present

## 2022-10-15 DIAGNOSIS — I119 Hypertensive heart disease without heart failure: Secondary | ICD-10-CM | POA: Diagnosis not present

## 2022-10-15 DIAGNOSIS — Z951 Presence of aortocoronary bypass graft: Secondary | ICD-10-CM | POA: Diagnosis not present

## 2022-10-15 NOTE — Patient Outreach (Signed)
[place] Coordinator follow up. Verified in [facility], Sheila Keller discharged from [facility] on 10/09/22.  Has [facility] home health services.   Will send notification to [facility] care management.  PCP office [facility] has [facility] care management services.   Sheila Rolling, MSN, RN,BSN [place] Acute Care Coordinator [phone] (Direct dial)

## 2022-10-18 DIAGNOSIS — I119 Hypertensive heart disease without heart failure: Secondary | ICD-10-CM | POA: Diagnosis not present

## 2022-10-18 DIAGNOSIS — N3020 Other chronic cystitis without hematuria: Secondary | ICD-10-CM | POA: Diagnosis not present

## 2022-10-18 DIAGNOSIS — Z48812 Encounter for surgical aftercare following surgery on the circulatory system: Secondary | ICD-10-CM | POA: Diagnosis not present

## 2022-10-18 DIAGNOSIS — E119 Type 2 diabetes mellitus without complications: Secondary | ICD-10-CM | POA: Diagnosis not present

## 2022-10-18 DIAGNOSIS — M6281 Muscle weakness (generalized): Secondary | ICD-10-CM | POA: Diagnosis not present

## 2022-10-18 DIAGNOSIS — Z7982 Long term (current) use of aspirin: Secondary | ICD-10-CM | POA: Diagnosis not present

## 2022-10-18 DIAGNOSIS — E7800 Pure hypercholesterolemia, unspecified: Secondary | ICD-10-CM | POA: Diagnosis not present

## 2022-10-18 DIAGNOSIS — E118 Type 2 diabetes mellitus with unspecified complications: Secondary | ICD-10-CM | POA: Diagnosis not present

## 2022-10-18 DIAGNOSIS — Z7984 Long term (current) use of oral hypoglycemic drugs: Secondary | ICD-10-CM | POA: Diagnosis not present

## 2022-10-18 DIAGNOSIS — R262 Difficulty in walking, not elsewhere classified: Secondary | ICD-10-CM | POA: Diagnosis not present

## 2022-10-18 DIAGNOSIS — M8580 Other specified disorders of bone density and structure, unspecified site: Secondary | ICD-10-CM | POA: Diagnosis not present

## 2022-10-18 DIAGNOSIS — I350 Nonrheumatic aortic (valve) stenosis: Secondary | ICD-10-CM | POA: Diagnosis not present

## 2022-10-18 DIAGNOSIS — I2510 Atherosclerotic heart disease of native coronary artery without angina pectoris: Secondary | ICD-10-CM | POA: Diagnosis not present

## 2022-10-18 DIAGNOSIS — Z951 Presence of aortocoronary bypass graft: Secondary | ICD-10-CM | POA: Diagnosis not present

## 2022-10-18 NOTE — Addendum Note (Signed)
Addended by: Debria Garret L on: 10/18/2022 01:00 PM   Modules accepted: Orders

## 2022-10-20 ENCOUNTER — Other Ambulatory Visit: Payer: Self-pay | Admitting: Cardiothoracic Surgery

## 2022-10-20 DIAGNOSIS — M6281 Muscle weakness (generalized): Secondary | ICD-10-CM | POA: Diagnosis not present

## 2022-10-20 DIAGNOSIS — Z7982 Long term (current) use of aspirin: Secondary | ICD-10-CM | POA: Diagnosis not present

## 2022-10-20 DIAGNOSIS — Z951 Presence of aortocoronary bypass graft: Secondary | ICD-10-CM | POA: Diagnosis not present

## 2022-10-20 DIAGNOSIS — I2510 Atherosclerotic heart disease of native coronary artery without angina pectoris: Secondary | ICD-10-CM | POA: Diagnosis not present

## 2022-10-20 DIAGNOSIS — I350 Nonrheumatic aortic (valve) stenosis: Secondary | ICD-10-CM | POA: Diagnosis not present

## 2022-10-20 DIAGNOSIS — E7800 Pure hypercholesterolemia, unspecified: Secondary | ICD-10-CM | POA: Diagnosis not present

## 2022-10-20 DIAGNOSIS — N3020 Other chronic cystitis without hematuria: Secondary | ICD-10-CM | POA: Diagnosis not present

## 2022-10-20 DIAGNOSIS — Z7984 Long term (current) use of oral hypoglycemic drugs: Secondary | ICD-10-CM | POA: Diagnosis not present

## 2022-10-20 DIAGNOSIS — M8580 Other specified disorders of bone density and structure, unspecified site: Secondary | ICD-10-CM | POA: Diagnosis not present

## 2022-10-20 DIAGNOSIS — I10 Essential (primary) hypertension: Secondary | ICD-10-CM | POA: Diagnosis not present

## 2022-10-20 DIAGNOSIS — Z48812 Encounter for surgical aftercare following surgery on the circulatory system: Secondary | ICD-10-CM | POA: Diagnosis not present

## 2022-10-20 DIAGNOSIS — R262 Difficulty in walking, not elsewhere classified: Secondary | ICD-10-CM | POA: Diagnosis not present

## 2022-10-20 DIAGNOSIS — E118 Type 2 diabetes mellitus with unspecified complications: Secondary | ICD-10-CM | POA: Diagnosis not present

## 2022-10-20 DIAGNOSIS — I25119 Atherosclerotic heart disease of native coronary artery with unspecified angina pectoris: Secondary | ICD-10-CM | POA: Diagnosis not present

## 2022-10-20 DIAGNOSIS — I119 Hypertensive heart disease without heart failure: Secondary | ICD-10-CM | POA: Diagnosis not present

## 2022-10-20 DIAGNOSIS — E119 Type 2 diabetes mellitus without complications: Secondary | ICD-10-CM | POA: Diagnosis not present

## 2022-10-21 ENCOUNTER — Ambulatory Visit (INDEPENDENT_AMBULATORY_CARE_PROVIDER_SITE_OTHER): Payer: Self-pay | Admitting: Cardiothoracic Surgery

## 2022-10-21 ENCOUNTER — Ambulatory Visit
Admission: RE | Admit: 2022-10-21 | Discharge: 2022-10-21 | Disposition: A | Discharge status: Home / Self Care | Payer: Medicare Other | Source: Ambulatory Visit | Attending: Cardiothoracic Surgery | Admitting: Cardiothoracic Surgery

## 2022-10-21 VITALS — BP 106/70 | HR 96 | Resp 20 | Ht 63.5 in | Wt 146.0 lb

## 2022-10-21 DIAGNOSIS — R0602 Shortness of breath: Secondary | ICD-10-CM | POA: Diagnosis not present

## 2022-10-21 DIAGNOSIS — Z951 Presence of aortocoronary bypass graft: Secondary | ICD-10-CM

## 2022-10-21 DIAGNOSIS — I25119 Atherosclerotic heart disease of native coronary artery with unspecified angina pectoris: Secondary | ICD-10-CM

## 2022-10-23 NOTE — Progress Notes (Signed)
S/p CAB x 3 on 09/20/22   Four weeks out and looks very well.    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   Had left thora two weeks ago - felt sig better afterwards.      O: Mild-Moderate left pleural effusion on XR   Vitals:   10/21/22 1410  BP: 106/70  Pulse: 96  Resp: 20  SpO2: 97%   Alert and oriented.  Incision well healed 0-1+ LE edema.     A/P  64F s/p CAB x 3  Mild re-accumulation of left pleural fluid.    See back in clinic 1 month

## 2022-10-25 DIAGNOSIS — E119 Type 2 diabetes mellitus without complications: Secondary | ICD-10-CM | POA: Diagnosis not present

## 2022-10-25 DIAGNOSIS — Z7984 Long term (current) use of oral hypoglycemic drugs: Secondary | ICD-10-CM | POA: Diagnosis not present

## 2022-10-25 DIAGNOSIS — N3020 Other chronic cystitis without hematuria: Secondary | ICD-10-CM | POA: Diagnosis not present

## 2022-10-25 DIAGNOSIS — Z7982 Long term (current) use of aspirin: Secondary | ICD-10-CM | POA: Diagnosis not present

## 2022-10-25 DIAGNOSIS — E118 Type 2 diabetes mellitus with unspecified complications: Secondary | ICD-10-CM | POA: Diagnosis not present

## 2022-10-25 DIAGNOSIS — I350 Nonrheumatic aortic (valve) stenosis: Secondary | ICD-10-CM | POA: Diagnosis not present

## 2022-10-25 DIAGNOSIS — M6281 Muscle weakness (generalized): Secondary | ICD-10-CM | POA: Diagnosis not present

## 2022-10-25 DIAGNOSIS — Z48812 Encounter for surgical aftercare following surgery on the circulatory system: Secondary | ICD-10-CM | POA: Diagnosis not present

## 2022-10-25 DIAGNOSIS — E7800 Pure hypercholesterolemia, unspecified: Secondary | ICD-10-CM | POA: Diagnosis not present

## 2022-10-25 DIAGNOSIS — Z951 Presence of aortocoronary bypass graft: Secondary | ICD-10-CM | POA: Diagnosis not present

## 2022-10-25 DIAGNOSIS — I2510 Atherosclerotic heart disease of native coronary artery without angina pectoris: Secondary | ICD-10-CM | POA: Diagnosis not present

## 2022-10-25 DIAGNOSIS — R262 Difficulty in walking, not elsewhere classified: Secondary | ICD-10-CM | POA: Diagnosis not present

## 2022-10-25 DIAGNOSIS — M8580 Other specified disorders of bone density and structure, unspecified site: Secondary | ICD-10-CM | POA: Diagnosis not present

## 2022-10-25 DIAGNOSIS — I119 Hypertensive heart disease without heart failure: Secondary | ICD-10-CM | POA: Diagnosis not present

## 2022-10-26 DIAGNOSIS — Z951 Presence of aortocoronary bypass graft: Secondary | ICD-10-CM | POA: Diagnosis not present

## 2022-10-26 DIAGNOSIS — Z7982 Long term (current) use of aspirin: Secondary | ICD-10-CM | POA: Diagnosis not present

## 2022-10-26 DIAGNOSIS — E119 Type 2 diabetes mellitus without complications: Secondary | ICD-10-CM | POA: Diagnosis not present

## 2022-10-26 DIAGNOSIS — E118 Type 2 diabetes mellitus with unspecified complications: Secondary | ICD-10-CM | POA: Diagnosis not present

## 2022-10-26 DIAGNOSIS — N3020 Other chronic cystitis without hematuria: Secondary | ICD-10-CM | POA: Diagnosis not present

## 2022-10-26 DIAGNOSIS — I119 Hypertensive heart disease without heart failure: Secondary | ICD-10-CM | POA: Diagnosis not present

## 2022-10-26 DIAGNOSIS — R262 Difficulty in walking, not elsewhere classified: Secondary | ICD-10-CM | POA: Diagnosis not present

## 2022-10-26 DIAGNOSIS — M6281 Muscle weakness (generalized): Secondary | ICD-10-CM | POA: Diagnosis not present

## 2022-10-26 DIAGNOSIS — E7800 Pure hypercholesterolemia, unspecified: Secondary | ICD-10-CM | POA: Diagnosis not present

## 2022-10-26 DIAGNOSIS — I2510 Atherosclerotic heart disease of native coronary artery without angina pectoris: Secondary | ICD-10-CM | POA: Diagnosis not present

## 2022-10-26 DIAGNOSIS — I350 Nonrheumatic aortic (valve) stenosis: Secondary | ICD-10-CM | POA: Diagnosis not present

## 2022-10-26 DIAGNOSIS — M8580 Other specified disorders of bone density and structure, unspecified site: Secondary | ICD-10-CM | POA: Diagnosis not present

## 2022-10-26 DIAGNOSIS — Z7984 Long term (current) use of oral hypoglycemic drugs: Secondary | ICD-10-CM | POA: Diagnosis not present

## 2022-10-26 DIAGNOSIS — Z48812 Encounter for surgical aftercare following surgery on the circulatory system: Secondary | ICD-10-CM | POA: Diagnosis not present

## 2022-10-27 DIAGNOSIS — I25119 Atherosclerotic heart disease of native coronary artery with unspecified angina pectoris: Secondary | ICD-10-CM | POA: Diagnosis not present

## 2022-10-27 DIAGNOSIS — E119 Type 2 diabetes mellitus without complications: Secondary | ICD-10-CM | POA: Diagnosis not present

## 2022-11-01 DIAGNOSIS — Z48812 Encounter for surgical aftercare following surgery on the circulatory system: Secondary | ICD-10-CM | POA: Diagnosis not present

## 2022-11-01 DIAGNOSIS — Z951 Presence of aortocoronary bypass graft: Secondary | ICD-10-CM | POA: Diagnosis not present

## 2022-11-01 DIAGNOSIS — Z7984 Long term (current) use of oral hypoglycemic drugs: Secondary | ICD-10-CM | POA: Diagnosis not present

## 2022-11-01 DIAGNOSIS — E7800 Pure hypercholesterolemia, unspecified: Secondary | ICD-10-CM | POA: Diagnosis not present

## 2022-11-01 DIAGNOSIS — I2510 Atherosclerotic heart disease of native coronary artery without angina pectoris: Secondary | ICD-10-CM | POA: Diagnosis not present

## 2022-11-01 DIAGNOSIS — M8580 Other specified disorders of bone density and structure, unspecified site: Secondary | ICD-10-CM | POA: Diagnosis not present

## 2022-11-01 DIAGNOSIS — E119 Type 2 diabetes mellitus without complications: Secondary | ICD-10-CM | POA: Diagnosis not present

## 2022-11-01 DIAGNOSIS — M6281 Muscle weakness (generalized): Secondary | ICD-10-CM | POA: Diagnosis not present

## 2022-11-01 DIAGNOSIS — R262 Difficulty in walking, not elsewhere classified: Secondary | ICD-10-CM | POA: Diagnosis not present

## 2022-11-01 DIAGNOSIS — N3020 Other chronic cystitis without hematuria: Secondary | ICD-10-CM | POA: Diagnosis not present

## 2022-11-01 DIAGNOSIS — I350 Nonrheumatic aortic (valve) stenosis: Secondary | ICD-10-CM | POA: Diagnosis not present

## 2022-11-01 DIAGNOSIS — E118 Type 2 diabetes mellitus with unspecified complications: Secondary | ICD-10-CM | POA: Diagnosis not present

## 2022-11-01 DIAGNOSIS — I119 Hypertensive heart disease without heart failure: Secondary | ICD-10-CM | POA: Diagnosis not present

## 2022-11-01 DIAGNOSIS — Z7982 Long term (current) use of aspirin: Secondary | ICD-10-CM | POA: Diagnosis not present

## 2022-11-02 DIAGNOSIS — N3020 Other chronic cystitis without hematuria: Secondary | ICD-10-CM | POA: Diagnosis not present

## 2022-11-02 DIAGNOSIS — E118 Type 2 diabetes mellitus with unspecified complications: Secondary | ICD-10-CM | POA: Diagnosis not present

## 2022-11-02 DIAGNOSIS — M8580 Other specified disorders of bone density and structure, unspecified site: Secondary | ICD-10-CM | POA: Diagnosis not present

## 2022-11-02 DIAGNOSIS — I2510 Atherosclerotic heart disease of native coronary artery without angina pectoris: Secondary | ICD-10-CM | POA: Diagnosis not present

## 2022-11-02 DIAGNOSIS — E7800 Pure hypercholesterolemia, unspecified: Secondary | ICD-10-CM | POA: Diagnosis not present

## 2022-11-02 DIAGNOSIS — R262 Difficulty in walking, not elsewhere classified: Secondary | ICD-10-CM | POA: Diagnosis not present

## 2022-11-02 DIAGNOSIS — Z7984 Long term (current) use of oral hypoglycemic drugs: Secondary | ICD-10-CM | POA: Diagnosis not present

## 2022-11-02 DIAGNOSIS — Z7982 Long term (current) use of aspirin: Secondary | ICD-10-CM | POA: Diagnosis not present

## 2022-11-02 DIAGNOSIS — I119 Hypertensive heart disease without heart failure: Secondary | ICD-10-CM | POA: Diagnosis not present

## 2022-11-02 DIAGNOSIS — I350 Nonrheumatic aortic (valve) stenosis: Secondary | ICD-10-CM | POA: Diagnosis not present

## 2022-11-02 DIAGNOSIS — E119 Type 2 diabetes mellitus without complications: Secondary | ICD-10-CM | POA: Diagnosis not present

## 2022-11-02 DIAGNOSIS — Z951 Presence of aortocoronary bypass graft: Secondary | ICD-10-CM | POA: Diagnosis not present

## 2022-11-02 DIAGNOSIS — M6281 Muscle weakness (generalized): Secondary | ICD-10-CM | POA: Diagnosis not present

## 2022-11-02 DIAGNOSIS — Z48812 Encounter for surgical aftercare following surgery on the circulatory system: Secondary | ICD-10-CM | POA: Diagnosis not present

## 2022-11-08 ENCOUNTER — Telehealth: Payer: Self-pay | Admitting: *Deleted

## 2022-11-08 NOTE — Telephone Encounter (Signed)
Patient contacted the office requesting she do cardiac rehab at the [facility] location. Advised once she see Dr. Tenny Craw in clinic the next time we will place a referral for her to begin. Patient also states her dental bridge fell out over the weekend. Patient states her dentist placed her on antibiotics for this. Advised patient to continue taking the antibiotics as prescribed. Patient verbalized understanding.

## 2022-11-12 ENCOUNTER — Telehealth: Payer: Self-pay | Admitting: *Deleted

## 2022-11-12 NOTE — Telephone Encounter (Signed)
   Patient Name: Sheila Keller  DOB: Jun 07, 1946 MRN: [id number]  Primary Cardiologist: None  Chart reviewed as part of pre-operative protocol coverage.   Simple dental extractions (i.e. 1-2 teeth) are considered low risk procedures per guidelines and generally do not require any specific cardiac clearance. It is also generally accepted that for simple extractions and dental cleanings, there is no need to interrupt blood thinner therapy.  Pt is s/p CABG in 09/2022. She is to continue ASA therapy uninterrupted for a minimum of 6 months.   SBE prophylaxis is not required for the patient from a cardiac standpoint.  I will route this recommendation to the requesting party via Epic fax function and remove from pre-op pool.  Please call with questions.  Lenna Sciara, NP 11/12/2022, 12:36 PM

## 2022-11-12 NOTE — Telephone Encounter (Signed)
   Pre-operative Risk Assessment    Patient Name: Sheila Keller  DOB: [birth year] MRN: [id number]     Request for Surgical Clearance    Procedure:   SURGICAL EXTRACTION OF 1 TOOTH (#5), CROWN AND BRIDGE PREP FOR TEETH NUMBER 3-6.  Date of Surgery:  Clearance TBD                                 Surgeon:  DR. Delman Cheadle, DMD Surgeon's Group or Practice Name:  [facility] Phone number:  [phone] Fax number:  [phone]   Type of Clearance Requested:   - Medical ; ASA 325 AND DOES PT NEEDS SBE?   Type of Anesthesia:   PER DENTAL OFFICE NO SEDATIONOR LOCAL BEING USED   Additional requests/questions:    Jiles Prows   11/12/2022, 11:34 AM

## 2022-11-19 DIAGNOSIS — Z951 Presence of aortocoronary bypass graft: Secondary | ICD-10-CM | POA: Diagnosis not present

## 2022-11-19 DIAGNOSIS — E7800 Pure hypercholesterolemia, unspecified: Secondary | ICD-10-CM | POA: Diagnosis not present

## 2022-11-19 DIAGNOSIS — I350 Nonrheumatic aortic (valve) stenosis: Secondary | ICD-10-CM | POA: Diagnosis not present

## 2022-11-19 DIAGNOSIS — I25119 Atherosclerotic heart disease of native coronary artery with unspecified angina pectoris: Secondary | ICD-10-CM | POA: Diagnosis not present

## 2022-11-19 DIAGNOSIS — I10 Essential (primary) hypertension: Secondary | ICD-10-CM | POA: Diagnosis not present

## 2022-11-24 ENCOUNTER — Other Ambulatory Visit: Payer: Self-pay | Admitting: Cardiothoracic Surgery

## 2022-11-24 DIAGNOSIS — Z951 Presence of aortocoronary bypass graft: Secondary | ICD-10-CM

## 2022-11-25 ENCOUNTER — Ambulatory Visit
Admission: RE | Admit: 2022-11-25 | Discharge: 2022-11-25 | Disposition: A | Discharge status: Home / Self Care | Payer: Medicare Other | Source: Ambulatory Visit | Attending: Cardiothoracic Surgery | Admitting: Cardiothoracic Surgery

## 2022-11-25 ENCOUNTER — Other Ambulatory Visit: Payer: Self-pay

## 2022-11-25 ENCOUNTER — Ambulatory Visit (INDEPENDENT_AMBULATORY_CARE_PROVIDER_SITE_OTHER): Payer: Self-pay | Admitting: Cardiothoracic Surgery

## 2022-11-25 VITALS — BP 118/76 | HR 90 | Resp 20 | Ht 63.5 in | Wt 149.0 lb

## 2022-11-25 DIAGNOSIS — J90 Pleural effusion, not elsewhere classified: Secondary | ICD-10-CM | POA: Diagnosis not present

## 2022-11-25 DIAGNOSIS — I517 Cardiomegaly: Secondary | ICD-10-CM | POA: Diagnosis not present

## 2022-11-25 DIAGNOSIS — Z951 Presence of aortocoronary bypass graft: Secondary | ICD-10-CM

## 2022-11-25 DIAGNOSIS — I700 Atherosclerosis of aorta: Secondary | ICD-10-CM | POA: Diagnosis not present

## 2022-11-25 DIAGNOSIS — I25119 Atherosclerotic heart disease of native coronary artery with unspecified angina pectoris: Secondary | ICD-10-CM | POA: Diagnosis not present

## 2022-11-25 DIAGNOSIS — E119 Type 2 diabetes mellitus without complications: Secondary | ICD-10-CM | POA: Diagnosis not present

## 2022-11-25 DIAGNOSIS — R918 Other nonspecific abnormal finding of lung field: Secondary | ICD-10-CM | POA: Diagnosis not present

## 2022-11-25 NOTE — Progress Notes (Signed)
Referral placed to Cardiac Rehab, [facility] per Dr. Binnie Kand orders.

## 2022-11-25 NOTE — Progress Notes (Signed)
T[facility] Progress Note:  S/p CAB x 3 on 09/20/22   Four weeks out and looks very well.    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  xr effusion is mild from moderate last month  She is getting signed up for cardiac rehab.   Told her OK to get flu shot.   ?Broke a bridge of teeth recently.   Discussed endocarditis prophylaxis with dental and other surgical procedures in valvular heart disease  Follow-up PRN as doing well and incisions healed.   Justice Rocher MD  [facility] Surgery

## 2022-11-26 ENCOUNTER — Telehealth: Payer: Self-pay | Admitting: Cardiology

## 2022-11-26 DIAGNOSIS — E1165 Type 2 diabetes mellitus with hyperglycemia: Secondary | ICD-10-CM | POA: Diagnosis not present

## 2022-11-26 DIAGNOSIS — Z23 Encounter for immunization: Secondary | ICD-10-CM | POA: Diagnosis not present

## 2022-11-26 DIAGNOSIS — E785 Hyperlipidemia, unspecified: Secondary | ICD-10-CM | POA: Diagnosis not present

## 2022-11-26 DIAGNOSIS — I10 Essential (primary) hypertension: Secondary | ICD-10-CM | POA: Diagnosis not present

## 2022-11-26 NOTE — Telephone Encounter (Signed)
  Pt c/o medication issue:  1. Name of Medication: empagliflozin (JARDIANCE) 25 MG TABS tablet   2. How are you currently taking this medication (dosage and times per day)? Take 25 mg by mouth daily.   3. Are you having a reaction (difficulty breathing--STAT)? No   4. What is your medication issue?   [place] physician calling she said, they are trying to help pt to enroll to pt assistance for Jardiance, however, pt assistance is more strict now and they want pt to apply for social security low income subsidy. One of the other option is to change Jardiance to Tensed and wants to know if Dr. Gardiner Rhyme is ok with that.

## 2022-11-26 NOTE — Telephone Encounter (Signed)
Dr Gardiner Rhyme is out of the office for the [place] until 12-07-22.  Returned call to [employer] physician notified she states that pt has enough to last until his return. She will let us know if there is anything we need to do before then.

## 2022-12-01 MED ORDER — METOPROLOL TARTRATE 25 MG PO TABS: 25.0000 mg | ORAL_TABLET | Freq: Two times a day (BID) | ORAL | 2 refills | Status: DC

## 2022-12-01 NOTE — Telephone Encounter (Signed)
Pt calling back for an update  

## 2022-12-01 NOTE — Telephone Encounter (Signed)
Spoke with pt she states that she needs a refill of the ASA 325 mg as she takes daily? She states that she will finish her current rx of the Januvia then switch to the [place]. Pt states that she already informed [facility] physicians-[place] and left a detailed message OK to switch to [place]. He will give Loma Sousa this message.

## 2022-12-01 NOTE — Telephone Encounter (Signed)
That is fine to switch to [place]

## 2022-12-02 MED ORDER — ASPIRIN 325 MG PO TBEC: 325.0000 mg | DELAYED_RELEASE_TABLET | Freq: Every day | ORAL | 2 refills | Status: DC

## 2022-12-02 NOTE — Telephone Encounter (Signed)
That is fine to refill the ASA

## 2022-12-02 NOTE — Telephone Encounter (Signed)
Rx sent to pharmacy   

## 2022-12-04 DIAGNOSIS — E785 Hyperlipidemia, unspecified: Secondary | ICD-10-CM | POA: Diagnosis not present

## 2022-12-04 DIAGNOSIS — E1165 Type 2 diabetes mellitus with hyperglycemia: Secondary | ICD-10-CM | POA: Diagnosis not present

## 2022-12-04 DIAGNOSIS — I10 Essential (primary) hypertension: Secondary | ICD-10-CM | POA: Diagnosis not present

## 2022-12-13 ENCOUNTER — Other Ambulatory Visit: Payer: Self-pay | Admitting: Family Medicine

## 2022-12-13 DIAGNOSIS — Z1231 Encounter for screening mammogram for malignant neoplasm of breast: Secondary | ICD-10-CM

## 2022-12-16 ENCOUNTER — Encounter (HOSPITAL_COMMUNITY)
Admission: RE | Admit: 2022-12-16 | Discharge: 2022-12-16 | Disposition: A | Discharge status: Home / Self Care | Payer: Medicare Other | Source: Ambulatory Visit | Attending: Cardiothoracic Surgery | Admitting: Cardiothoracic Surgery

## 2022-12-16 VITALS — BP 92/60 | HR 90 | Ht 63.5 in | Wt 154.1 lb

## 2022-12-16 DIAGNOSIS — Z951 Presence of aortocoronary bypass graft: Secondary | ICD-10-CM | POA: Insufficient documentation

## 2022-12-16 NOTE — Progress Notes (Signed)
Cardiac Individual Treatment Plan  Patient Details  Name: Sheila Keller MRN: [id number] Date of Birth: 04-Feb-1946 Referring Provider:   Flowsheet Row CARDIAC REHAB PHASE II ORIENTATION from 12/16/2022 in [place]  Referring Provider Dr. Tenny Craw       Initial Encounter Date:  Flowsheet Row CARDIAC REHAB PHASE II ORIENTATION from 12/16/2022 in [place]  Date 12/16/22       Visit Diagnosis: S/P CABG x 3  Patient's Home Medications on Admission:  Current Outpatient Medications:    aspirin EC 325 MG tablet, Take 1 tablet (325 mg total) by mouth daily., Disp: 30 tablet, Rfl: 2   atorvastatin (LIPITOR) 80 MG tablet, Take 1 tablet (80 mg total) by mouth daily., Disp: 90 tablet, Rfl: 3   Cholecalciferol (VITAMIN D3) 2000 units TABS, Take 2,000 Units by mouth every morning., Disp: , Rfl:    docusate sodium (COLACE) 100 MG capsule, Take 100 mg by mouth daily., Disp: , Rfl:    empagliflozin (JARDIANCE) 25 MG TABS tablet, Take 25 mg by mouth daily., Disp: , Rfl:    metFORMIN (GLUCOPHAGE) 500 MG tablet, Take 1,000 mg by mouth 2 (two) times daily with a meal., Disp: , Rfl: 1   metoprolol tartrate (LOPRESSOR) 25 MG tablet, Take 1 tablet (25 mg total) by mouth 2 (two) times daily., Disp: 90 tablet, Rfl: 2   sitaGLIPtin (JANUVIA) 100 MG tablet, Take 100 mg by mouth daily., Disp: , Rfl:    furosemide (LASIX) 20 MG tablet, Take 0.5 tablets (10 mg total) by mouth daily. Take 1/2 tablet of the 20 mg per day (10 mg po x7 days) (Patient not taking: Reported on 12/15/2022), Disp: 7 tablet, Rfl: 0   risedronate (ACTONEL) 150 MG tablet, PLEASE SEE ATTACHED FOR DETAILED DIRECTIONS (Patient not taking: Reported on 12/15/2022), Disp: 3 tablet, Rfl: 1  Past Medical History: Past Medical History:  Diagnosis Date   Anxiety    Breast lump 08/2017   no cancer/ [facility]   Chronic cystitis    Diabetes mellitus    type 2   Elevated cholesterol     Heart murmur    Aortic valve stenosis.  Sees cardiologist with [facility], no current problems per patient   Hypertension    Mild dysplasia of cervix (CIN I)    Osteopenia 02/2018   T score -1.8 FRAX 11% / 2.2%    Tobacco Use: Social History   Tobacco Use  Smoking Status Never  Smokeless Tobacco Never    Labs: Review Flowsheet       Latest Ref Rng & Units 12/28/2007 09/16/2022 09/20/2022 10/13/2022  Labs for ITP Cardiac and Pulmonary Rehab  Cholestrol 100 - 199 mg/dL - - - 110   LDL (calc) 0 - 99 mg/dL - - - 42   HDL-C >39 mg/dL - - - 43   Trlycerides 0 - 149 mg/dL - - - 145   Hemoglobin A1c 4.8 - 5.6 % - 7.0  - -  PH, Arterial 7.35 - 7.45 - 7.42  7.321  7.390  7.386  7.440  7.504  7.378  7.307  -  PCO2 arterial 32 - 48 mmHg - 29  39.8  33.4  34.1  31.0  30.2  38.1  40.2  -  Bicarbonate 20.0 - 28.0 mmol/L 19.7  18.8  20.6  20.2  20.5  21.1  23.8  19.7  22.5  20.1  -  TCO2 22 - 32 mmol/L 21  - 22  $'21  22  22  23  22  23  25  21  21  24  21  21  21  'K$ -  Acid-base deficit 0.0 - 2.0 mmol/L 3.0  4.4  5.0  4.0  4.0  3.0  4.0  2.0  6.0  -  O2 Saturation % - 99  99  99  59.9  100  100  100  74  100  100  -    Capillary Blood Glucose: Lab Results  Component Value Date   GLUCAP 119 (H) 09/27/2022   GLUCAP 118 (H) 09/27/2022   GLUCAP 110 (H) 09/27/2022   GLUCAP 135 (H) 09/26/2022   GLUCAP 108 (H) 09/26/2022    POCT Glucose     Row Name 12/16/22 1232             POCT Blood Glucose   Pre-Exercise 155 mg/dL                Exercise Target Goals: Exercise Program Goal: Individual exercise prescription set using results from initial 6 min walk test and THRR while considering  patient's activity barriers and safety.   Exercise Prescription Goal: Starting with aerobic activity 30 plus minutes a day, 3 days per week for initial exercise prescription. Provide home exercise prescription and guidelines that participant acknowledges understanding prior to discharge.  Activity  Barriers & Risk Stratification:  Activity Barriers & Cardiac Risk Stratification - 12/16/22 1303       Activity Barriers & Cardiac Risk Stratification   Activity Barriers None    Cardiac Risk Stratification High             6 Minute Walk:  6 Minute Walk     Row Name 12/16/22 1306         6 Minute Walk   Phase Initial     Distance 1100 feet     Walk Time 6 minutes     # of Rest Breaks 0     MPH 2.08     METS 2.83     RPE 12     VO2 Peak 9.92     Symptoms No     Resting HR 84 bpm     Resting BP 92/60     Resting Oxygen Saturation  97 %     Exercise Oxygen Saturation  during 6 min walk 97 %     Max Ex. HR 98 bpm     Max Ex. BP 110/60     2 Minute Post BP 106/60              Oxygen Initial Assessment:   Oxygen Re-Evaluation:   Oxygen Discharge (Final Oxygen Re-Evaluation):   Initial Exercise Prescription:  Initial Exercise Prescription - 12/16/22 1300       Date of Initial Exercise RX and Referring Provider   Date 12/16/22    Referring Provider Dr. Tenny Craw    Expected Discharge Date 03/11/23      NuStep   Level 1    SPM 80    Minutes 22      Arm Ergometer   Level 1    RPM 45    Minutes 17      Prescription Details   Frequency (times per week) 3    Duration Progress to 30 minutes of continuous aerobic without signs/symptoms of physical distress      Intensity   THRR 40-80% of Max Heartrate 58-115    Ratings of Perceived Exertion 11-13  Perceived Dyspnea 0-4      Resistance Training   Training Prescription Yes    Weight 3    Reps 10-15             Perform Capillary Blood Glucose checks as needed.  Exercise Prescription Changes:   Exercise Comments:   Exercise Goals and Review:   Exercise Goals     Row Name 12/16/22 1313             Exercise Goals   Increase Physical Activity Yes       Intervention Provide advice, education, support and counseling about physical activity/exercise needs.;Develop an individualized  exercise prescription for aerobic and resistive training based on initial evaluation findings, risk stratification, comorbidities and participant's personal goals.       Expected Outcomes Short Term: Attend rehab on a regular basis to increase amount of physical activity.;Long Term: Add in home exercise to make exercise part of routine and to increase amount of physical activity.;Long Term: Exercising regularly at least 3-5 days a week.       Increase Strength and Stamina Yes       Intervention Provide advice, education, support and counseling about physical activity/exercise needs.;Develop an individualized exercise prescription for aerobic and resistive training based on initial evaluation findings, risk stratification, comorbidities and participant's personal goals.       Expected Outcomes Short Term: Increase workloads from initial exercise prescription for resistance, speed, and METs.;Short Term: Perform resistance training exercises routinely during rehab and add in resistance training at home;Long Term: Improve cardiorespiratory fitness, muscular endurance and strength as measured by increased METs and functional capacity (6MWT)       Able to understand and use rate of perceived exertion (RPE) scale Yes       Intervention Provide education and explanation on how to use RPE scale       Expected Outcomes Short Term: Able to use RPE daily in rehab to express subjective intensity level;Long Term:  Able to use RPE to guide intensity level when exercising independently       Knowledge and understanding of Target Heart Rate Range (THRR) Yes       Intervention Provide education and explanation of THRR including how the numbers were predicted and where they are located for reference       Expected Outcomes Long Term: Able to use THRR to govern intensity when exercising independently;Short Term: Able to state/look up THRR;Short Term: Able to use daily as guideline for intensity in rehab       Able to check  pulse independently Yes       Intervention Provide education and demonstration on how to check pulse in carotid and radial arteries.;Review the importance of being able to check your own pulse for safety during independent exercise       Expected Outcomes Short Term: Able to explain why pulse checking is important during independent exercise;Long Term: Able to check pulse independently and accurately       Understanding of Exercise Prescription Yes       Intervention Provide education, explanation, and written materials on patient's individual exercise prescription       Expected Outcomes Short Term: Able to explain program exercise prescription;Long Term: Able to explain home exercise prescription to exercise independently                Exercise Goals Re-Evaluation :    Discharge Exercise Prescription (Final Exercise Prescription Changes):   Nutrition:  Target Goals: Understanding of nutrition  guidelines, daily intake of sodium '1500mg'$ , cholesterol '200mg'$ , calories 30% from fat and 7% or less from saturated fats, daily to have 5 or more servings of fruits and vegetables.  Biometrics:  Pre Biometrics - 12/16/22 1314       Pre Biometrics   Height 5' 3.5" (1.613 m)    Weight 154 lb 1.6 oz (69.9 kg)    Waist Circumference 38 inches    Hip Circumference 40 inches    Waist to Hip Ratio 0.95 %    BMI (Calculated) 26.87    Triceps Skinfold 18 mm    % Body Fat 38 %    Grip Strength 12.6 kg    Flexibility 0 in    Single Leg Stand 0 seconds              Nutrition Therapy Plan and Nutrition Goals:  Nutrition Therapy & Goals - 12/16/22 1306       Intervention Plan   Intervention Nutrition handout(s) given to patient.    Expected Outcomes Short Term Goal: Understand basic principles of dietary content, such as calories, fat, sodium, cholesterol and nutrients.             Nutrition Assessments:  Nutrition Assessments - 12/16/22 1306       MEDFICTS Scores   Pre  Score 43            MEDIFICTS Score Key: ?70 Need to make dietary changes  40-70 Heart Healthy Diet ? 40 Therapeutic Level Cholesterol Diet   Picture Your Plate Scores: <93 Unhealthy dietary pattern with much room for improvement. 41-50 Dietary pattern unlikely to meet recommendations for good health and room for improvement. 51-60 More healthful dietary pattern, with some room for improvement.  >60 Healthy dietary pattern, although there may be some specific behaviors that could be improved.    Nutrition Goals Re-Evaluation:   Nutrition Goals Discharge (Final Nutrition Goals Re-Evaluation):   Psychosocial: Target Goals: Acknowledge presence or absence of significant depression and/or stress, maximize coping skills, provide positive support system. Participant is able to verbalize types and ability to use techniques and skills needed for reducing stress and depression.  Initial Review & Psychosocial Screening:  Initial Psych Review & Screening - 12/16/22 1305       Initial Review   Current issues with None Identified      Family Dynamics   Good Support System? Yes    Comments Her 2 children and her friends are her support system.      Barriers   Psychosocial barriers to participate in program There are no identifiable barriers or psychosocial needs.      Screening Interventions   Interventions Encouraged to exercise    Expected Outcomes Long Term goal: The participant improves quality of Life and PHQ9 Scores as seen by post scores and/or verbalization of changes;Short Term goal: Identification and review with participant of any Quality of Life or Depression concerns found by scoring the questionnaire.             Quality of Life Scores:  Quality of Life - 12/16/22 1403       Quality of Life   Select Quality of Life      Quality of Life Scores   Health/Function Pre 21.6 %    Socioeconomic Pre 19.5 %    Psych/Spiritual Pre 24 %    Family Pre 18 %     GLOBAL Pre 21.23 %            Scores  of 19 and below usually indicate a poorer quality of life in these areas.  A difference of  2-3 points is a clinically meaningful difference.  A difference of 2-3 points in the total score of the Quality of Life Index has been associated with significant improvement in overall quality of life, self-image, physical symptoms, and general health in studies assessing change in quality of life.  PHQ-9: Review Flowsheet       12/16/2022  Depression screen PHQ 2/9  Decreased Interest 0  Down, Depressed, Hopeless 0  PHQ - 2 Score 0  Altered sleeping 0  Tired, decreased energy 0  Change in appetite 0  Feeling bad or failure about yourself  0  Trouble concentrating 0  Moving slowly or fidgety/restless 0  Suicidal thoughts 0  PHQ-9 Score 0  Difficult doing work/chores Not difficult at all   Interpretation of Total Score  Total Score Depression Severity:  1-4 = Minimal depression, 5-9 = Mild depression, 10-14 = Moderate depression, 15-19 = Moderately severe depression, 20-27 = Severe depression   Psychosocial Evaluation and Intervention:  Psychosocial Evaluation - 12/16/22 1421       Psychosocial Evaluation & Interventions   Interventions Encouraged to exercise with the program and follow exercise prescription;Relaxation education;Stress management education    Comments Pt has no barriers to participating in CR. She has no identifiable psychosocial issues and scored a 0 on her PHQ-9. She is very fearful of COVID, and states that she does not get out of the house much anymore due to this fear. She was previously walking a lot at her house, but she has not been doing any walking since her CABG. She reports that she has a good support system with her children and her friends. She has a large group of friends that she keeps very close with. Her goals while in the program are to return to her normal ADL's. She is eager to start the program.    Expected  Outcomes Pt will continue to have no identifiable psychosocial issues.    Continue Psychosocial Services  No Follow up required             Psychosocial Re-Evaluation:   Psychosocial Discharge (Final Psychosocial Re-Evaluation):   Vocational Rehabilitation: Provide vocational rehab assistance to qualifying candidates.   Vocational Rehab Evaluation & Intervention:  Vocational Rehab - 12/16/22 1243       Initial Vocational Rehab Evaluation & Intervention   Assessment shows need for Vocational Rehabilitation No      Vocational Rehab Re-Evaulation   Comments She is retired             Education: Education Goals: Education classes will be provided on a weekly basis, covering required topics. Participant will state understanding/return demonstration of topics presented.  Learning Barriers/Preferences:  Learning Barriers/Preferences - 12/16/22 1242       Learning Barriers/Preferences   Learning Barriers None    Learning Preferences Written Material             Education Topics: Hypertension, Hypertension Reduction -Define heart disease and high blood pressure. Discus how high blood pressure affects the body and ways to reduce high blood pressure.   Exercise and Your Heart -Discuss why it is important to exercise, the FITT principles of exercise, normal and abnormal responses to exercise, and how to exercise safely.   Angina -Discuss definition of angina, causes of angina, treatment of angina, and how to decrease risk of having angina.   Cardiac Medications -Review what the  following cardiac medications are used for, how they affect the body, and side effects that may occur when taking the medications.  Medications include Aspirin, Beta blockers, calcium channel blockers, ACE Inhibitors, angiotensin receptor blockers, diuretics, digoxin, and antihyperlipidemics.   Congestive Heart Failure -Discuss the definition of CHF, how to live with CHF, the signs and  symptoms of CHF, and how keep track of weight and sodium intake.   Heart Disease and Intimacy -Discus the effect sexual activity has on the heart, how changes occur during intimacy as we age, and safety during sexual activity.   Smoking Cessation / COPD -Discuss different methods to quit smoking, the health benefits of quitting smoking, and the definition of COPD.   Nutrition I: Fats -Discuss the types of cholesterol, what cholesterol does to the heart, and how cholesterol levels can be controlled.   Nutrition II: Labels -Discuss the different components of food labels and how to read food label   Heart Parts/Heart Disease and PAD -Discuss the anatomy of the heart, the pathway of blood circulation through the heart, and these are affected by heart disease.   Stress I: Signs and Symptoms -Discuss the causes of stress, how stress may lead to anxiety and depression, and ways to limit stress.   Stress II: Relaxation -Discuss different types of relaxation techniques to limit stress.   Warning Signs of Stroke / TIA -Discuss definition of a stroke, what the signs and symptoms are of a stroke, and how to identify when someone is having stroke.   Knowledge Questionnaire Score:  Knowledge Questionnaire Score - 12/16/22 1245       Knowledge Questionnaire Score   Pre Score 10/24             Core Components/Risk Factors/Patient Goals at Admission:  Personal Goals and Risk Factors at Admission - 12/16/22 1316       Core Components/Risk Factors/Patient Goals on Admission   Improve shortness of breath with ADL's Yes    Intervention Provide education, individualized exercise plan and daily activity instruction to help decrease symptoms of SOB with activities of daily living.    Expected Outcomes Short Term: Improve cardiorespiratory fitness to achieve a reduction of symptoms when performing ADLs;Long Term: Be able to perform more ADLs without symptoms or delay the onset of  symptoms    Diabetes Yes    Intervention Provide education about signs/symptoms and action to take for hypo/hyperglycemia.;Provide education about proper nutrition, including hydration, and aerobic/resistive exercise prescription along with prescribed medications to achieve blood glucose in normal ranges: Fasting glucose 65-99 mg/dL    Expected Outcomes Short Term: Participant verbalizes understanding of the signs/symptoms and immediate care of hyper/hypoglycemia, proper foot care and importance of medication, aerobic/resistive exercise and nutrition plan for blood glucose control.;Long Term: Attainment of HbA1C < 7%.    Hypertension Yes    Intervention Provide education on lifestyle modifcations including regular physical activity/exercise, weight management, moderate sodium restriction and increased consumption of fresh fruit, vegetables, and low fat dairy, alcohol moderation, and smoking cessation.;Monitor prescription use compliance.    Expected Outcomes Short Term: Continued assessment and intervention until BP is < 140/68m HG in hypertensive participants. < 130/840mHG in hypertensive participants with diabetes, heart failure or chronic kidney disease.;Long Term: Maintenance of blood pressure at goal levels.    Lipids Yes    Intervention Provide education and support for participant on nutrition & aerobic/resistive exercise along with prescribed medications to achieve LDL '70mg'$ , HDL >'40mg'$ .    Expected Outcomes Short Term:  Participant states understanding of desired cholesterol values and is compliant with medications prescribed. Participant is following exercise prescription and nutrition guidelines.;Long Term: Cholesterol controlled with medications as prescribed, with individualized exercise RX and with personalized nutrition plan. Value goals: LDL < '70mg'$ , HDL > 40 mg.             Core Components/Risk Factors/Patient Goals Review:    Core Components/Risk Factors/Patient Goals at  Discharge (Final Review):    ITP Comments:   Comments: Patient arrived for 1st visit/orientation/education at 1230. Patient was referred to CR by Dr. Tenny Craw due to S/P CABG x3 (Z95.1). During orientation advised patient on arrival and appointment times what to wear, what to do before, during and after exercise. Reviewed attendance and class policy.  Pt is scheduled to return Cardiac Rehab on 12/20/2022 at 0930. Pt was advised to come to class 15 minutes before class starts.  Discussed RPE/Dpysnea scales. Patient participated in warm up stretches. Patient was able to complete 6 minute walk test.  Telemetry: NSR. Patient was measured for the equipment. Discussed equipment safety with patient. Took patient pre-anthropometric measurements. Patient finished visit at 1330.

## 2022-12-20 ENCOUNTER — Encounter (HOSPITAL_COMMUNITY): Payer: Medicare Other

## 2022-12-20 ENCOUNTER — Encounter (HOSPITAL_COMMUNITY)
Admission: RE | Admit: 2022-12-20 | Discharge: 2022-12-20 | Disposition: A | Discharge status: Home / Self Care | Payer: Medicare Other | Source: Ambulatory Visit | Attending: Cardiothoracic Surgery | Admitting: Cardiothoracic Surgery

## 2022-12-20 VITALS — Wt 154.5 lb

## 2022-12-20 DIAGNOSIS — I25119 Atherosclerotic heart disease of native coronary artery with unspecified angina pectoris: Secondary | ICD-10-CM | POA: Diagnosis not present

## 2022-12-20 DIAGNOSIS — E7800 Pure hypercholesterolemia, unspecified: Secondary | ICD-10-CM | POA: Diagnosis not present

## 2022-12-20 DIAGNOSIS — I10 Essential (primary) hypertension: Secondary | ICD-10-CM | POA: Diagnosis not present

## 2022-12-20 DIAGNOSIS — Z951 Presence of aortocoronary bypass graft: Secondary | ICD-10-CM | POA: Diagnosis not present

## 2022-12-20 DIAGNOSIS — I350 Nonrheumatic aortic (valve) stenosis: Secondary | ICD-10-CM | POA: Diagnosis not present

## 2022-12-20 LAB — GLUCOSE, CAPILLARY: Glucose-Capillary: 162 mg/dL — ABNORMAL HIGH (ref 70–99)

## 2022-12-20 NOTE — Progress Notes (Signed)
Daily Session Note  Patient Details  Name: Sheila Keller MRN: [id number] Date of Birth: [birth year] Referring Provider:   Flowsheet Row CARDIAC REHAB PHASE II ORIENTATION from 12/16/2022 in [place]  Referring Provider Dr. Tenny Craw       Encounter Date: 12/20/2022  Check In:  Session Check In - 12/20/22 0927       Check-In   Supervising physician immediately available to respond to emergencies [facility] MD immediately available    Physician(s) Dr. Dellia Cloud    Location [name]    Staff Present Leana Roe, BS, Exercise Physiologist;Dalton Sherrie George, MS, ACSM-CEP;Madelyn Flavors, RN, BSN    Virtual Visit No    Medication changes reported     No    Fall or balance concerns reported    No    Tobacco Cessation No Change    Warm-up and Cool-down Performed as group-led instruction    Resistance Training Performed Yes    VAD Patient? No    PAD/SET Patient? No      Pain Assessment   Currently in Pain? No/denies    Multiple Pain Sites No             Capillary Blood Glucose: No results found for this or any previous visit (from the past 24 hour(s)).    Social History   Tobacco Use  Smoking Status Never  Smokeless Tobacco Never    Goals Met:  Independence with exercise equipment Exercise tolerated well No report of concerns or symptoms today Strength training completed today  Goals Unmet:  Not Applicable  Comments: Checkout at 1030.   Dr. Carlyle Dolly is Medical Director for [facility] Cardiac Rehab

## 2022-12-22 ENCOUNTER — Encounter (HOSPITAL_COMMUNITY): Payer: Medicare Other

## 2022-12-22 ENCOUNTER — Encounter (HOSPITAL_COMMUNITY)
Admission: RE | Admit: 2022-12-22 | Discharge: 2022-12-22 | Disposition: A | Discharge status: Home / Self Care | Payer: Medicare Other | Source: Ambulatory Visit | Attending: Cardiothoracic Surgery | Admitting: Cardiothoracic Surgery

## 2022-12-22 DIAGNOSIS — Z951 Presence of aortocoronary bypass graft: Secondary | ICD-10-CM | POA: Diagnosis not present

## 2022-12-22 LAB — GLUCOSE, CAPILLARY: Glucose-Capillary: 151 mg/dL — ABNORMAL HIGH (ref 70–99)

## 2022-12-22 NOTE — Progress Notes (Signed)
Daily Session Note  Patient Details  Name: Sheila Keller MRN: [id number] Date of Birth: 10-Nov-1946 Referring Provider:   Flowsheet Row CARDIAC REHAB PHASE II ORIENTATION from 12/16/2022 in [place]  Referring Provider Dr. Tenny Craw       Encounter Date: 12/22/2022  Check In:  Session Check In - 12/22/22 0930       Check-In   Supervising physician immediately available to respond to emergencies [facility] MD immediately available    Physician(s) Dr. Harl Bowie    Location [name]    Staff Present Leana Roe, BS, Exercise Physiologist;Dalton Sherrie George, MS, ACSM-CEP;Melven Sartorius BSN, RN    Virtual Visit No    Medication changes reported     No    Fall or balance concerns reported    No    Tobacco Cessation No Change    Warm-up and Cool-down Performed as group-led Higher education careers adviser Performed Yes    VAD Patient? No    PAD/SET Patient? No      Pain Assessment   Currently in Pain? No/denies    Multiple Pain Sites No             Capillary Blood Glucose: Results for orders placed or performed during the hospital encounter of 12/20/22 (from the past 24 hour(s))  Glucose, capillary     Status: Abnormal   Collection Time: 12/22/22  9:20 AM  Result Value Ref Range   Glucose-Capillary 151 (H) 70 - 99 mg/dL      Social History   Tobacco Use  Smoking Status Never  Smokeless Tobacco Never    Goals Met:  Independence with exercise equipment Exercise tolerated well No report of concerns or symptoms today Strength training completed today  Goals Unmet:  Not Applicable  Comments: check out at 10:30   Dr. Carlyle Dolly is Medical Director for [facility]

## 2022-12-24 ENCOUNTER — Encounter (HOSPITAL_COMMUNITY)
Admission: RE | Admit: 2022-12-24 | Discharge: 2022-12-24 | Disposition: A | Discharge status: Home / Self Care | Payer: Medicare Other | Source: Ambulatory Visit | Attending: Cardiothoracic Surgery | Admitting: Cardiothoracic Surgery

## 2022-12-24 ENCOUNTER — Encounter (HOSPITAL_COMMUNITY): Payer: Medicare Other

## 2022-12-24 DIAGNOSIS — Z951 Presence of aortocoronary bypass graft: Secondary | ICD-10-CM | POA: Diagnosis not present

## 2022-12-24 NOTE — Progress Notes (Signed)
Daily Session Note  Patient Details  Name: Sheila Keller MRN: [id number] Date of Birth: 03/11/1946 Referring Provider:   Flowsheet Row CARDIAC REHAB PHASE II ORIENTATION from 12/16/2022 in [place]  Referring Provider Dr. Tenny Craw       Encounter Date: 12/24/2022  Check In:  Session Check In - 12/24/22 0924       Check-In   Supervising physician immediately available to respond to emergencies [facility] MD immediately available    Physician(s) Dr. Harl Bowie    Location [name]    Staff Present Leana Roe, BS, Exercise Physiologist;Dalton Sherrie George, MS, ACSM-CEP;Madelyn Flavors, RN, BSN    Virtual Visit No    Medication changes reported     No    Fall or balance concerns reported    No    Tobacco Cessation No Change    Warm-up and Cool-down Performed as group-led instruction    Resistance Training Performed Yes    VAD Patient? No    PAD/SET Patient? No      Pain Assessment   Currently in Pain? No/denies    Multiple Pain Sites No             Capillary Blood Glucose: No results found for this or any previous visit (from the past 24 hour(s)).    Social History   Tobacco Use  Smoking Status Never  Smokeless Tobacco Never    Goals Met:  Independence with exercise equipment Exercise tolerated well No report of concerns or symptoms today Strength training completed today  Goals Unmet:  Not Applicable  Comments: Checkout at 1030.   Dr. Carlyle Dolly is Medical Director for [facility]

## 2022-12-27 ENCOUNTER — Encounter (HOSPITAL_COMMUNITY): Payer: Medicare Other

## 2022-12-27 ENCOUNTER — Encounter (HOSPITAL_COMMUNITY)
Admission: RE | Admit: 2022-12-27 | Discharge: 2022-12-27 | Disposition: A | Discharge status: Home / Self Care | Payer: Medicare Other | Source: Ambulatory Visit | Attending: Cardiothoracic Surgery | Admitting: Cardiothoracic Surgery

## 2022-12-27 DIAGNOSIS — Z951 Presence of aortocoronary bypass graft: Secondary | ICD-10-CM | POA: Diagnosis not present

## 2022-12-27 NOTE — Progress Notes (Signed)
Daily Session Note  Patient Details  Name: CASADY VOSHELL MRN: [id number] Date of Birth: [birth year] Referring Provider:   Flowsheet Row CARDIAC REHAB PHASE II ORIENTATION from 12/16/2022 in [place]  Referring Provider Dr. Tenny Craw       Encounter Date: 12/27/2022  Check In:  Session Check In - 12/27/22 0933       Check-In   Supervising physician immediately available to respond to emergencies [facility] MD immediately available    Physician(s) Dr. Harl Bowie    Location [name]    Staff Present Leana Roe, BS, Exercise Physiologist;Dalton Sherrie George, MS, ACSM-CEP;Madelyn Flavors, RN, BSN    Virtual Visit No    Medication changes reported     No    Fall or balance concerns reported    No    Tobacco Cessation No Change    Warm-up and Cool-down Performed as group-led instruction    Resistance Training Performed Yes    VAD Patient? No    PAD/SET Patient? No      Pain Assessment   Currently in Pain? No/denies    Multiple Pain Sites No             Capillary Blood Glucose: No results found for this or any previous visit (from the past 24 hour(s)).    Social History   Tobacco Use  Smoking Status Never  Smokeless Tobacco Never    Goals Met:  Independence with exercise equipment Exercise tolerated well No report of concerns or symptoms today Strength training completed today  Goals Unmet:  Not Applicable  Comments: Checkout at 1030.   Dr. Carlyle Dolly is Medical Director for [facility]

## 2022-12-29 ENCOUNTER — Encounter (HOSPITAL_COMMUNITY): Payer: Medicare Other

## 2022-12-29 ENCOUNTER — Encounter (HOSPITAL_COMMUNITY)
Admission: RE | Admit: 2022-12-29 | Discharge: 2022-12-29 | Disposition: A | Discharge status: Home / Self Care | Payer: Medicare Other | Source: Ambulatory Visit | Attending: Cardiothoracic Surgery | Admitting: Cardiothoracic Surgery

## 2022-12-29 DIAGNOSIS — Z951 Presence of aortocoronary bypass graft: Secondary | ICD-10-CM

## 2022-12-29 NOTE — Progress Notes (Signed)
Daily Session Note  Patient Details  Name: Sheila Keller MRN: [id number] Date of Birth: Apr 04, 1946 Referring Provider:   Flowsheet Row CARDIAC REHAB PHASE II ORIENTATION from 12/16/2022 in [place]  Referring Provider Dr. Tenny Craw       Encounter Date: 12/29/2022  Check In:  Session Check In - 12/29/22 0930       Check-In   Supervising physician immediately available to respond to emergencies [facility] MD immediately available    Physician(s) Dr. Domenic Polite    Location [name]    Staff Present Hoy Register MHA, MS, ACSM-CEP;[employer] BSN, RN;Heather Mel Almond, [place], Exercise Physiologist    Virtual Visit No    Medication changes reported     No    Fall or balance concerns reported    No    Tobacco Cessation No Change    Warm-up and Cool-down Performed as group-led instruction    Resistance Training Performed Yes    VAD Patient? No    PAD/SET Patient? No      Pain Assessment   Currently in Pain? No/denies    Multiple Pain Sites No             Capillary Blood Glucose: No results found for this or any previous visit (from the past 24 hour(s)).    Social History   Tobacco Use  Smoking Status Never  Smokeless Tobacco Never    Goals Met:  Independence with exercise equipment Exercise tolerated well No report of concerns or symptoms today Strength training completed today  Goals Unmet:  Not Applicable  Comments: checkout time is 1030   Dr. Carlyle Dolly is Medical Director for [facility]

## 2022-12-30 ENCOUNTER — Telehealth: Payer: Self-pay | Admitting: Cardiology

## 2022-12-30 NOTE — Telephone Encounter (Signed)
Surgery 10/16 and got dismiss 12/21. And she is having therapy and its pulling chest. Concerns that the snitches will pop. Wants to know if she should continue with the therapy. Please advise

## 2022-12-30 NOTE — Telephone Encounter (Signed)
Patient stated she feels pulling on her sternum when doing arm exercises on the stationary bike. She is concerned that she is causing harm to her surgical site and is afraid to work her arms. She also wanted to know what to use on her scar. Recommended vitamin E or any OTC scar cream, and to gently massage it onto her surgical sites. Please f/u with patient in cardiac rehab regarding her concerns.

## 2022-12-31 ENCOUNTER — Encounter (HOSPITAL_COMMUNITY)
Admission: RE | Admit: 2022-12-31 | Discharge: 2022-12-31 | Disposition: A | Discharge status: Home / Self Care | Payer: Medicare Other | Source: Ambulatory Visit | Attending: Cardiothoracic Surgery | Admitting: Cardiothoracic Surgery

## 2022-12-31 ENCOUNTER — Encounter (HOSPITAL_COMMUNITY): Payer: Medicare Other

## 2022-12-31 DIAGNOSIS — Z951 Presence of aortocoronary bypass graft: Secondary | ICD-10-CM | POA: Diagnosis not present

## 2022-12-31 NOTE — Progress Notes (Signed)
Daily Session Note  Patient Details  Name: Sheila Keller MRN: [id number] Date of Birth: [birth year] Referring Provider:   Flowsheet Row CARDIAC REHAB PHASE II ORIENTATION from 12/16/2022 in [place]  Referring Provider Dr. Tenny Craw       Encounter Date: 12/31/2022  Check In:  Session Check In - 12/31/22 0919       Check-In   Supervising physician immediately available to respond to emergencies [facility] immediately available    Physician(s) Dr. Domenic Polite    Location [name]    Staff Present Leana Roe, BS, Exercise Physiologist;Dalton Sherrie George, MS, ACSM-CEP;Cortlyn Cannell Wynetta Emery, RN, BSN    Virtual Visit No    Medication changes reported     No    Fall or balance concerns reported    No    Tobacco Cessation No Change    Warm-up and Cool-down Performed as group-led instruction    Resistance Training Performed Yes    VAD Patient? No    PAD/SET Patient? No      Pain Assessment   Currently in Pain? No/denies    Multiple Pain Sites No             Capillary Blood Glucose: No results found for this or any previous visit (from the past 24 hour(s)).    Social History   Tobacco Use  Smoking Status Never  Smokeless Tobacco Never    Goals Met:  Independence with exercise equipment Exercise tolerated well No report of concerns or symptoms today Strength training completed today  Goals Unmet:  Not Applicable  Comments: Check out 1030.   Dr. Carlyle Dolly is Medical Director for [facility]

## 2023-01-03 ENCOUNTER — Encounter (HOSPITAL_COMMUNITY)
Admission: RE | Admit: 2023-01-03 | Discharge: 2023-01-03 | Disposition: A | Discharge status: Home / Self Care | Payer: Medicare Other | Source: Ambulatory Visit | Attending: Cardiothoracic Surgery | Admitting: Cardiothoracic Surgery

## 2023-01-03 ENCOUNTER — Encounter (HOSPITAL_COMMUNITY): Payer: Medicare Other

## 2023-01-03 VITALS — Wt 153.7 lb

## 2023-01-03 DIAGNOSIS — Z951 Presence of aortocoronary bypass graft: Secondary | ICD-10-CM | POA: Diagnosis not present

## 2023-01-03 NOTE — Progress Notes (Signed)
Daily Session Note  Patient Details  Name: Sheila Keller MRN: [id number] Date of Birth: 01-May-1946 Referring Provider:   Flowsheet Row CARDIAC REHAB PHASE II ORIENTATION from 12/16/2022 in [place]  Referring Provider Dr. Tenny Craw       Encounter Date: 01/03/2023  Check In:  Session Check In - 01/03/23 0930       Check-In   Supervising physician immediately available to respond to emergencies [facility] MD immediately available    Physician(s) Dr. Dellia Cloud    Location [name]    Staff Present Leana Roe, BS, Exercise Physiologist;Jayd Cadieux Wynetta Emery, RN, BSN    Virtual Visit No    Medication changes reported     No    Fall or balance concerns reported    No    Tobacco Cessation No Change    Warm-up and Cool-down Performed as group-led instruction    Resistance Training Performed Yes    VAD Patient? No    PAD/SET Patient? No      Pain Assessment   Currently in Pain? No/denies    Multiple Pain Sites No             Capillary Blood Glucose: No results found for this or any previous visit (from the past 24 hour(s)).    Social History   Tobacco Use  Smoking Status Never  Smokeless Tobacco Never    Goals Met:  Independence with exercise equipment Exercise tolerated well No report of concerns or symptoms today Strength training completed today  Goals Unmet:  Not Applicable  Comments: Check out 1030.   Dr. Carlyle Dolly is Medical Director for [facility] Card[facility]

## 2023-01-05 ENCOUNTER — Encounter (HOSPITAL_COMMUNITY): Payer: Medicare Other

## 2023-01-05 ENCOUNTER — Encounter (HOSPITAL_COMMUNITY)
Admission: RE | Admit: 2023-01-05 | Discharge: 2023-01-05 | Disposition: A | Discharge status: Home / Self Care | Payer: Medicare Other | Source: Ambulatory Visit | Attending: Cardiothoracic Surgery | Admitting: Cardiothoracic Surgery

## 2023-01-05 DIAGNOSIS — Z951 Presence of aortocoronary bypass graft: Secondary | ICD-10-CM | POA: Diagnosis not present

## 2023-01-05 NOTE — Progress Notes (Signed)
Daily Session Note  Patient Details  Name: Sheila Keller MRN: [id number] Date of Birth: 1946/08/12 Referring Provider:   Flowsheet Row CARDIAC REHAB PHASE II ORIENTATION from 12/16/2022 in [place]  Referring Provider Dr. Tenny Craw       Encounter Date: 01/05/2023  Check In:  Session Check In - 01/05/23 0927       Check-In   Supervising physician immediately available to respond to emergencies [facility] MD immediately available    Physician(s) Dr. Dellia Cloud    Location [name]    Staff Present Hoy Register MHA, MS, ACSM-CEP;Madelyn Flavors, RN, BSN;Heather Mel Almond, BS, Exercise Physiologist    Virtual Visit No    Medication changes reported     No    Fall or balance concerns reported    No    Tobacco Cessation No Change    Warm-up and Cool-down Performed as group-led instruction    Resistance Training Performed Yes    VAD Patient? No    PAD/SET Patient? No      Pain Assessment   Currently in Pain? No/denies    Multiple Pain Sites No             Capillary Blood Glucose: No results found for this or any previous visit (from the past 24 hour(s)).    Social History   Tobacco Use  Smoking Status Never  Smokeless Tobacco Never    Goals Met:  Independence with exercise equipment Exercise tolerated well No report of concerns or symptoms today Strength training completed today  Goals Unmet:  Not Applicable  Comments: checkout time is 1030   Dr. Carlyle Dolly is Medical Director for [place]

## 2023-01-05 NOTE — Progress Notes (Signed)
Daily Session Note  Patient Details  Name: Sheila Keller MRN: [id number] Date of Birth: 25-Jun-1946 Referring Provider:   Flowsheet Row CARDIAC REHAB PHASE II ORIENTATION from 12/16/2022 in [place]  Referring Provider Dr. Tenny Craw       Encounter Date: 01/05/2023  Check In:  Session Check In - 01/05/23 0927       Check-In   Supervising physician immediately available to respond to emergencies [facility] MD immediately available    Physician(s) Dr. Dellia Cloud    Location [name]    Staff Present Hoy Register MHA, MS, ACSM-CEP;Madelyn Flavors, RN, BSN;Heather Mel Almond, BS, Exercise Physiologist    Virtual Visit No    Medication changes reported     No    Fall or balance concerns reported    No    Tobacco Cessation No Change    Warm-up and Cool-down Performed as group-led instruction    Resistance Training Performed Yes    VAD Patient? No    PAD/SET Patient? No      Pain Assessment   Currently in Pain? No/denies    Multiple Pain Sites No             Capillary Blood Glucose: No results found for this or any previous visit (from the past 24 hour(s)).    Social History   Tobacco Use  Smoking Status Never  Smokeless Tobacco Never    Goals Met:  Independence with exercise equipment Exercise tolerated well No report of concerns or symptoms today Strength training completed today  Goals Unmet:  Not Applicable  Comments: checkout time is 1030   Dr. Carlyle Dolly is Medical Director for [place]

## 2023-01-05 NOTE — Progress Notes (Signed)
Cardiac Individual Treatment Plan  Patient Details  Name: Sheila Keller MRN: [id number] Date of Birth: 06-13-1946 Referring Provider:   Flowsheet Row CARDIAC REHAB PHASE II ORIENTATION from 12/16/2022 in [place]  Referring Provider Dr. Tenny Craw       Initial Encounter Date:  Flowsheet Row CARDIAC REHAB PHASE II ORIENTATION from 12/16/2022 in [place]  Date 12/16/22       Visit Diagnosis: S/P CABG x 3  Patient's Home Medications on Admission:  Current Outpatient Medications:    aspirin EC 325 MG tablet, Take 1 tablet (325 mg total) by mouth daily., Disp: 30 tablet, Rfl: 2   atorvastatin (LIPITOR) 80 MG tablet, Take 1 tablet (80 mg total) by mouth daily., Disp: 90 tablet, Rfl: 3   Cholecalciferol (VITAMIN D3) 2000 units TABS, Take 2,000 Units by mouth every morning., Disp: , Rfl:    docusate sodium (COLACE) 100 MG capsule, Take 100 mg by mouth daily., Disp: , Rfl:    empagliflozin (JARDIANCE) 25 MG TABS tablet, Take 25 mg by mouth daily., Disp: , Rfl:    furosemide (LASIX) 20 MG tablet, Take 0.5 tablets (10 mg total) by mouth daily. Take 1/2 tablet of the 20 mg per day (10 mg po x7 days) (Patient not taking: Reported on 12/15/2022), Disp: 7 tablet, Rfl: 0   metFORMIN (GLUCOPHAGE) 500 MG tablet, Take 1,000 mg by mouth 2 (two) times daily with a meal., Disp: , Rfl: 1   metoprolol tartrate (LOPRESSOR) 25 MG tablet, Take 1 tablet (25 mg total) by mouth 2 (two) times daily., Disp: 90 tablet, Rfl: 2   risedronate (ACTONEL) 150 MG tablet, PLEASE SEE ATTACHED FOR DETAILED DIRECTIONS (Patient not taking: Reported on 12/15/2022), Disp: 3 tablet, Rfl: 1   sitaGLIPtin (JANUVIA) 100 MG tablet, Take 100 mg by mouth daily., Disp: , Rfl:   Past Medical History: Past Medical History:  Diagnosis Date   Anxiety    Breast lump 08/2017   no cancer/ [facility]   Chronic cystitis    Diabetes mellitus    type 2   Elevated cholesterol     Heart murmur    Aortic valve stenosis.  Sees cardiologist with [facility], no current problems per patient   Hypertension    Mild dysplasia of cervix (CIN I)    Osteopenia 02/2018   T score -1.8 FRAX 11% / 2.2%    Tobacco Use: Social History   Tobacco Use  Smoking Status Never  Smokeless Tobacco Never    Labs: Review Flowsheet       Latest Ref Rng & Units 12/28/2007 09/16/2022 09/20/2022 10/13/2022  Labs for ITP Cardiac and Pulmonary Rehab  Cholestrol 100 - 199 mg/dL - - - 110   LDL (calc) 0 - 99 mg/dL - - - 42   HDL-C >39 mg/dL - - - 43   Trlycerides 0 - 149 mg/dL - - - 145   Hemoglobin A1c 4.8 - 5.6 % - 7.0  - -  PH, Arterial 7.35 - 7.45 - 7.42  7.321  7.390  7.386  7.440  7.504  7.378  7.307  -  PCO2 arterial 32 - 48 mmHg - 29  39.8  33.4  34.1  31.0  30.2  38.1  40.2  -  Bicarbonate 20.0 - 28.0 mmol/L 19.7  18.8  20.6  20.2  20.5  21.1  23.8  19.7  22.5  20.1  -  TCO2 22 - 32 mmol/L 21  - 22  $'21  22  22  23  22  23  25  21  21  24  21  21  21  'o$ -  Acid-base deficit 0.0 - 2.0 mmol/L 3.0  4.4  5.0  4.0  4.0  3.0  4.0  2.0  6.0  -  O2 Saturation % - 99  99  99  59.9  100  100  100  74  100  100  -    Capillary Blood Glucose: Lab Results  Component Value Date   GLUCAP 151 (H) 12/22/2022   GLUCAP 162 (H) 12/20/2022   GLUCAP 119 (H) 09/27/2022   GLUCAP 118 (H) 09/27/2022   GLUCAP 110 (H) 09/27/2022    POCT Glucose     Row Name 12/16/22 1232             POCT Blood Glucose   Pre-Exercise 155 mg/dL                Exercise Target Goals: Exercise Program Goal: Individual exercise prescription set using results from initial 6 min walk test and THRR while considering  patient's activity barriers and safety.   Exercise Prescription Goal: Starting with aerobic activity 30 plus minutes a day, 3 days per week for initial exercise prescription. Provide home exercise prescription and guidelines that participant acknowledges understanding prior to discharge.  Activity  Barriers & Risk Stratification:  Activity Barriers & Cardiac Risk Stratification - 12/16/22 1303       Activity Barriers & Cardiac Risk Stratification   Activity Barriers None    Cardiac Risk Stratification High             6 Minute Walk:  6 Minute Walk     Row Name 12/16/22 1306         6 Minute Walk   Phase Initial     Distance 1100 feet     Walk Time 6 minutes     # of Rest Breaks 0     MPH 2.08     METS 2.83     RPE 12     VO2 Peak 9.92     Symptoms No     Resting HR 84 bpm     Resting BP 92/60     Resting Oxygen Saturation  97 %     Exercise Oxygen Saturation  during 6 min walk 97 %     Max Ex. HR 98 bpm     Max Ex. BP 110/60     2 Minute Post BP 106/60              Oxygen Initial Assessment:   Oxygen Re-Evaluation:   Oxygen Discharge (Final Oxygen Re-Evaluation):   Initial Exercise Prescription:  Initial Exercise Prescription - 12/16/22 1300       Date of Initial Exercise RX and Referring Provider   Date 12/16/22    Referring Provider Dr. Tenny Craw    Expected Discharge Date 03/11/23      NuStep   Level 1    SPM 80    Minutes 22      Arm Ergometer   Level 1    RPM 45    Minutes 17      Prescription Details   Frequency (times per week) 3    Duration Progress to 30 minutes of continuous aerobic without signs/symptoms of physical distress      Intensity   THRR 40-80% of Max Heartrate 58-115    Ratings of Perceived Exertion 11-13  Perceived Dyspnea 0-4      Resistance Training   Training Prescription Yes    Weight 3    Reps 10-15             Perform Capillary Blood Glucose checks as needed.  Exercise Prescription Changes:   Exercise Prescription Changes     Row Name 12/20/22 1200 01/03/23 1500           Response to Exercise   Blood Pressure (Admit) 106/66 112/60      Blood Pressure (Exercise) 110/60 110/62      Blood Pressure (Exit) 108/60 96/64      Heart Rate (Admit) 84 bpm 74 bpm      Heart Rate (Exercise)  93 bpm 105 bpm      Heart Rate (Exit) 91 bpm 83 bpm      Rating of Perceived Exertion (Exercise) 13 12      Duration Continue with 30 min of aerobic exercise without signs/symptoms of physical distress. Continue with 30 min of aerobic exercise without signs/symptoms of physical distress.      Intensity THRR unchanged THRR unchanged        Progression   Progression Continue to progress workloads to maintain intensity without signs/symptoms of physical distress. Continue to progress workloads to maintain intensity without signs/symptoms of physical distress.        Resistance Training   Training Prescription Yes Yes      Weight 2 2      Reps 10-15 10-15      Time 10 Minutes 10 Minutes        NuStep   Level 1 1      SPM 43 93      Minutes 39 39      METs 1.52 2.63               Exercise Comments:   Exercise Goals and Review:   Exercise Goals     Row Name 12/16/22 1313 01/03/23 1545           Exercise Goals   Increase Physical Activity Yes Yes      Intervention Provide advice, education, support and counseling about physical activity/exercise needs.;Develop an individualized exercise prescription for aerobic and resistive training based on initial evaluation findings, risk stratification, comorbidities and participant's personal goals. Provide advice, education, support and counseling about physical activity/exercise needs.;Develop an individualized exercise prescription for aerobic and resistive training based on initial evaluation findings, risk stratification, comorbidities and participant's personal goals.      Expected Outcomes Short Term: Attend rehab on a regular basis to increase amount of physical activity.;Long Term: Add in home exercise to make exercise part of routine and to increase amount of physical activity.;Long Term: Exercising regularly at least 3-5 days a week. Short Term: Attend rehab on a regular basis to increase amount of physical activity.;Long Term: Add  in home exercise to make exercise part of routine and to increase amount of physical activity.;Long Term: Exercising regularly at least 3-5 days a week.      Increase Strength and Stamina Yes Yes      Intervention Provide advice, education, support and counseling about physical activity/exercise needs.;Develop an individualized exercise prescription for aerobic and resistive training based on initial evaluation findings, risk stratification, comorbidities and participant's personal goals. Provide advice, education, support and counseling about physical activity/exercise needs.;Develop an individualized exercise prescription for aerobic and resistive training based on initial evaluation findings, risk stratification, comorbidities and participant's personal goals.  Expected Outcomes Short Term: Increase workloads from initial exercise prescription for resistance, speed, and METs.;Short Term: Perform resistance training exercises routinely during rehab and add in resistance training at home;Long Term: Improve cardiorespiratory fitness, muscular endurance and strength as measured by increased METs and functional capacity (6MWT) Short Term: Increase workloads from initial exercise prescription for resistance, speed, and METs.;Short Term: Perform resistance training exercises routinely during rehab and add in resistance training at home;Long Term: Improve cardiorespiratory fitness, muscular endurance and strength as measured by increased METs and functional capacity (6MWT)      Able to understand and use rate of perceived exertion (RPE) scale Yes Yes      Intervention Provide education and explanation on how to use RPE scale Provide education and explanation on how to use RPE scale      Expected Outcomes Short Term: Able to use RPE daily in rehab to express subjective intensity level;Long Term:  Able to use RPE to guide intensity level when exercising independently Short Term: Able to use RPE daily in rehab to  express subjective intensity level;Long Term:  Able to use RPE to guide intensity level when exercising independently      Knowledge and understanding of Target Heart Rate Range (THRR) Yes Yes      Intervention Provide education and explanation of THRR including how the numbers were predicted and where they are located for reference Provide education and explanation of THRR including how the numbers were predicted and where they are located for reference      Expected Outcomes Long Term: Able to use THRR to govern intensity when exercising independently;Short Term: Able to state/look up THRR;Short Term: Able to use daily as guideline for intensity in rehab Long Term: Able to use THRR to govern intensity when exercising independently;Short Term: Able to state/look up THRR;Short Term: Able to use daily as guideline for intensity in rehab      Able to check pulse independently Yes Yes      Intervention Provide education and demonstration on how to check pulse in carotid and radial arteries.;Review the importance of being able to check your own pulse for safety during independent exercise Provide education and demonstration on how to check pulse in carotid and radial arteries.;Review the importance of being able to check your own pulse for safety during independent exercise      Expected Outcomes Short Term: Able to explain why pulse checking is important during independent exercise;Long Term: Able to check pulse independently and accurately Short Term: Able to explain why pulse checking is important during independent exercise;Long Term: Able to check pulse independently and accurately      Understanding of Exercise Prescription Yes Yes      Intervention Provide education, explanation, and written materials on patient's individual exercise prescription Provide education, explanation, and written materials on patient's individual exercise prescription      Expected Outcomes Short Term: Able to explain program  exercise prescription;Long Term: Able to explain home exercise prescription to exercise independently Short Term: Able to explain program exercise prescription;Long Term: Able to explain home exercise prescription to exercise independently               Exercise Goals Re-Evaluation :  Exercise Goals Re-Evaluation     Row Name 01/03/23 1545             Exercise Goal Re-Evaluation   Exercise Goals Review Increase Physical Activity;Able to understand and use rate of perceived exertion (RPE) scale;Increase Strength and Stamina;Knowledge and understanding of  Target Heart Rate Range (THRR);Able to check pulse independently;Understanding of Exercise Prescription       Comments Pt has comepleted 8 sessions of CR. She stated that when using the arms on the stepper she felt it was pulling at her chest so we lowered the arms and told her to just use her legs. She just started using the arms again the past session and feels fine. She has increased her workload each class by increasing her SPM. She is currently exercising at 2.63 METs on the stepper. Will continue to monitor and progress as able.       Expected Outcomes Through exercise at rehab and home, the patient will meet their stated goals                 Discharge Exercise Prescription (Final Exercise Prescription Changes):  Exercise Prescription Changes - 01/03/23 1500       Response to Exercise   Blood Pressure (Admit) 112/60    Blood Pressure (Exercise) 110/62    Blood Pressure (Exit) 96/64    Heart Rate (Admit) 74 bpm    Heart Rate (Exercise) 105 bpm    Heart Rate (Exit) 83 bpm    Rating of Perceived Exertion (Exercise) 12    Duration Continue with 30 min of aerobic exercise without signs/symptoms of physical distress.    Intensity THRR unchanged      Progression   Progression Continue to progress workloads to maintain intensity without signs/symptoms of physical distress.      Resistance Training   Training Prescription  Yes    Weight 2    Reps 10-15    Time 10 Minutes      NuStep   Level 1    SPM 93    Minutes 39    METs 2.63             Nutrition:  Target Goals: Understanding of nutrition guidelines, daily intake of sodium '1500mg'$ , cholesterol '200mg'$ , calories 30% from fat and 7% or less from saturated fats, daily to have 5 or more servings of fruits and vegetables.  Biometrics:  Pre Biometrics - 12/16/22 1314       Pre Biometrics   Height 5' 3.5" (1.613 m)    Weight 69.9 kg    Waist Circumference 38 inches    Hip Circumference 40 inches    Waist to Hip Ratio 0.95 %    BMI (Calculated) 26.87    Triceps Skinfold 18 mm    % Body Fat 38 %    Grip Strength 12.6 kg    Flexibility 0 in    Single Leg Stand 0 seconds              Nutrition Therapy Plan and Nutrition Goals:  Nutrition Therapy & Goals - 12/27/22 1026       Personal Nutrition Goals   Comments Patient scored 43 on her diet assessment. We offer 2 educational sessions on heart healthy nutrition with handouts and assistance with RD referral if patient is interested.      Intervention Plan   Intervention Nutrition handout(s) given to patient.    Expected Outcomes Short Term Goal: Understand basic principles of dietary content, such as calories, fat, sodium, cholesterol and nutrients.             Nutrition Assessments:  Nutrition Assessments - 12/16/22 1306       MEDFICTS Scores   Pre Score 43            MEDIFICTS  Score Key: ?70 Need to make dietary changes  40-70 Heart Healthy Diet ? 40 Therapeutic Level Cholesterol Diet   Picture Your Plate Scores: <59 Unhealthy dietary pattern with much room for improvement. 41-50 Dietary pattern unlikely to meet recommendations for good health and room for improvement. 51-60 More healthful dietary pattern, with some room for improvement.  >60 Healthy dietary pattern, although there may be some specific behaviors that could be improved.    Nutrition Goals  Re-Evaluation:   Nutrition Goals Discharge (Final Nutrition Goals Re-Evaluation):   Psychosocial: Target Goals: Acknowledge presence or absence of significant depression and/or stress, maximize coping skills, provide positive support system. Participant is able to verbalize types and ability to use techniques and skills needed for reducing stress and depression.  Initial Review & Psychosocial Screening:  Initial Psych Review & Screening - 12/16/22 1305       Initial Review   Current issues with None Identified      Family Dynamics   Good Support System? Yes    Comments Her 2 children and her friends are her support system.      Barriers   Psychosocial barriers to participate in program There are no identifiable barriers or psychosocial needs.      Screening Interventions   Interventions Encouraged to exercise    Expected Outcomes Long Term goal: The participant improves quality of Life and PHQ9 Scores as seen by post scores and/or verbalization of changes;Short Term goal: Identification and review with participant of any Quality of Life or Depression concerns found by scoring the questionnaire.             Quality of Life Scores:  Quality of Life - 12/16/22 1403       Quality of Life   Select Quality of Life      Quality of Life Scores   Health/Function Pre 21.6 %    Socioeconomic Pre 19.5 %    Psych/Spiritual Pre 24 %    Family Pre 18 %    GLOBAL Pre 21.23 %            Scores of 19 and below usually indicate a poorer quality of life in these areas.  A difference of  2-3 points is a clinically meaningful difference.  A difference of 2-3 points in the total score of the Quality of Life Index has been associated with significant improvement in overall quality of life, self-image, physical symptoms, and general health in studies assessing change in quality of life.  PHQ-9: Review Flowsheet       12/16/2022  Depression screen PHQ 2/9  Decreased Interest 0   Down, Depressed, Hopeless 0  PHQ - 2 Score 0  Altered sleeping 0  Tired, decreased energy 0  Change in appetite 0  Feeling bad or failure about yourself  0  Trouble concentrating 0  Moving slowly or fidgety/restless 0  Suicidal thoughts 0  PHQ-9 Score 0  Difficult doing work/chores Not difficult at all   Interpretation of Total Score  Total Score Depression Severity:  1-4 = Minimal depression, 5-9 = Mild depression, 10-14 = Moderate depression, 15-19 = Moderately severe depression, 20-27 = Severe depression   Psychosocial Evaluation and Intervention:  Psychosocial Evaluation - 12/16/22 1421       Psychosocial Evaluation & Interventions   Interventions Encouraged to exercise with the program and follow exercise prescription;Relaxation education;Stress management education    Comments Pt has no barriers to participating in CR. She has no identifiable psychosocial issues and scored  a 0 on her PHQ-9. She is very fearful of COVID, and states that she does not get out of the house much anymore due to this fear. She was previously walking a lot at her house, but she has not been doing any walking since her CABG. She reports that she has a good support system with her children and her friends. She has a large group of friends that she keeps very close with. Her goals while in the program are to return to her normal ADL's. She is eager to start the program.    Expected Outcomes Pt will continue to have no identifiable psychosocial issues.    Continue Psychosocial Services  No Follow up required             Psychosocial Re-Evaluation:  Psychosocial Re-Evaluation     Rennert Name 12/27/22 1020             Psychosocial Re-Evaluation   Current issues with None Identified       Comments Patient is new to the program completing 4 sessions. She continues to have no psychosocial barriers identified. She seems to enjoy the sessions and demonstrates an interest in improving her health. We will  continue to monitor her progress.       Expected Outcomes Patient will complete the program meeting both personal and program goals.       Interventions Stress management education;Encouraged to attend Cardiac Rehabilitation for the exercise;Relaxation education       Continue Psychosocial Services  No Follow up required                Psychosocial Discharge (Final Psychosocial Re-Evaluation):  Psychosocial Re-Evaluation - 12/27/22 1020       Psychosocial Re-Evaluation   Current issues with None Identified    Comments Patient is new to the program completing 4 sessions. She continues to have no psychosocial barriers identified. She seems to enjoy the sessions and demonstrates an interest in improving her health. We will continue to monitor her progress.    Expected Outcomes Patient will complete the program meeting both personal and program goals.    Interventions Stress management education;Encouraged to attend Cardiac Rehabilitation for the exercise;Relaxation education    Continue Psychosocial Services  No Follow up required             Vocational Rehabilitation: Provide vocational rehab assistance to qualifying candidates.   Vocational Rehab Evaluation & Intervention:  Vocational Rehab - 12/16/22 1243       Initial Vocational Rehab Evaluation & Intervention   Assessment shows need for Vocational Rehabilitation No      Vocational Rehab Re-Evaulation   Comments She is retired             Education: Education Goals: Education classes will be provided on a weekly basis, covering required topics. Participant will state understanding/return demonstration of topics presented.  Learning Barriers/Preferences:  Learning Barriers/Preferences - 12/16/22 1242       Learning Barriers/Preferences   Learning Barriers None    Learning Preferences Written Material             Education Topics: Hypertension, Hypertension Reduction -Define heart disease and high  blood pressure. Discus how high blood pressure affects the body and ways to reduce high blood pressure.   Exercise and Your Heart -Discuss why it is important to exercise, the FITT principles of exercise, normal and abnormal responses to exercise, and how to exercise safely.   Angina -Discuss definition of angina, causes of  angina, treatment of angina, and how to decrease risk of having angina. Flowsheet Row CARDIAC REHAB PHASE II EXERCISE from 12/29/2022 in [place]  Date 12/22/22  Educator DF  Instruction Review Code 1- Verbalizes Understanding       Cardiac Medications -Review what the following cardiac medications are used for, how they affect the body, and side effects that may occur when taking the medications.  Medications include Aspirin, Beta blockers, calcium channel blockers, ACE Inhibitors, angiotensin receptor blockers, diuretics, digoxin, and antihyperlipidemics.   Congestive Heart Failure -Discuss the definition of CHF, how to live with CHF, the signs and symptoms of CHF, and how keep track of weight and sodium intake. Flowsheet Row CARDIAC REHAB PHASE II EXERCISE from 12/29/2022 in [place]  Date 12/29/22  Educator DF  Instruction Review Code 2- Demonstrated Understanding       Heart Disease and Intimacy -Discus the effect sexual activity has on the heart, how changes occur during intimacy as we age, and safety during sexual activity.   Smoking Cessation / COPD -Discuss different methods to quit smoking, the health benefits of quitting smoking, and the definition of COPD.   Nutrition I: Fats -Discuss the types of cholesterol, what cholesterol does to the heart, and how cholesterol levels can be controlled.   Nutrition II: Labels -Discuss the different components of food labels and how to read food label   Heart Parts/Heart Disease and PAD -Discuss the anatomy of the heart, the pathway of blood circulation  through the heart, and these are affected by heart disease.   Stress I: Signs and Symptoms -Discuss the causes of stress, how stress may lead to anxiety and depression, and ways to limit stress.   Stress II: Relaxation -Discuss different types of relaxation techniques to limit stress.   Warning Signs of Stroke / TIA -Discuss definition of a stroke, what the signs and symptoms are of a stroke, and how to identify when someone is having stroke.   Knowledge Questionnaire Score:  Knowledge Questionnaire Score - 12/16/22 1245       Knowledge Questionnaire Score   Pre Score 10/24             Core Components/Risk Factors/Patient Goals at Admission:  Personal Goals and Risk Factors at Admission - 12/16/22 1316       Core Components/Risk Factors/Patient Goals on Admission   Improve shortness of breath with ADL's Yes    Intervention Provide education, individualized exercise plan and daily activity instruction to help decrease symptoms of SOB with activities of daily living.    Expected Outcomes Short Term: Improve cardiorespiratory fitness to achieve a reduction of symptoms when performing ADLs;Long Term: Be able to perform more ADLs without symptoms or delay the onset of symptoms    Diabetes Yes    Intervention Provide education about signs/symptoms and action to take for hypo/hyperglycemia.;Provide education about proper nutrition, including hydration, and aerobic/resistive exercise prescription along with prescribed medications to achieve blood glucose in normal ranges: Fasting glucose 65-99 mg/dL    Expected Outcomes Short Term: Participant verbalizes understanding of the signs/symptoms and immediate care of hyper/hypoglycemia, proper foot care and importance of medication, aerobic/resistive exercise and nutrition plan for blood glucose control.;Long Term: Attainment of HbA1C < 7%.    Hypertension Yes    Intervention Provide education on lifestyle modifcations including regular  physical activity/exercise, weight management, moderate sodium restriction and increased consumption of fresh fruit, vegetables, and low fat dairy, alcohol moderation, and smoking cessation.;Monitor prescription  use compliance.    Expected Outcomes Short Term: Continued assessment and intervention until BP is < 140/49m HG in hypertensive participants. < 130/835mHG in hypertensive participants with diabetes, heart failure or chronic kidney disease.;Long Term: Maintenance of blood pressure at goal levels.    Lipids Yes    Intervention Provide education and support for participant on nutrition & aerobic/resistive exercise along with prescribed medications to achieve LDL '70mg'$ , HDL >'40mg'$ .    Expected Outcomes Short Term: Participant states understanding of desired cholesterol values and is compliant with medications prescribed. Participant is following exercise prescription and nutrition guidelines.;Long Term: Cholesterol controlled with medications as prescribed, with individualized exercise RX and with personalized nutrition plan. Value goals: LDL < '70mg'$ , HDL > 40 mg.             Core Components/Risk Factors/Patient Goals Review:   Goals and Risk Factor Review     Row Name 12/27/22 1021             Core Components/Risk Factors/Patient Goals Review   Personal Goals Review Improve shortness of breath with ADL's;Diabetes;Lipids;Hypertension;Other       Review Patient was referred to CR with CABGx3. She has multiple risk factors for CAD and is participating in the program for risk modification. She has completed 4 sessions. Her current weight is 154.8 lbs up 0.2 bls from her orientation weight. She is doing well in the program. Her DM is managed with [facility]. Her last A1C on file was 09/16/22 at 7.0%. Her blood pressure is well controlled. Her personal goals for the program are to get stronger; get her heart healthier; and return to her normal ADL's. We will continue to  monitor her progress as she works towards meeting these goals.       Expected Outcomes Patient will complete the program meeting both personal and program goals.                Core Components/Risk Factors/Patient Goals at Discharge (Final Review):   Goals and Risk Factor Review - 12/27/22 1021       Core Components/Risk Factors/Patient Goals Review   Personal Goals Review Improve shortness of breath with ADL's;Diabetes;Lipids;Hypertension;Other    Review Patient was referred to CR with CABGx3. She has multiple risk factors for CAD and is participating in the program for risk modification. She has completed 4 sessions. Her current weight is 154.8 lbs up 0.2 bls from her orientation weight. She is doing well in the program. Her DM is managed with [facility] [facility] Met[facility]. Her last A1C on file was 09/16/22 at 7.0%. Her blood pressure is well controlled. Her personal goals for the program are to get stronger; get her heart healthier; and return to her normal ADL's. We will continue to monitor her progress as she works towards meeting these goals.    Expected Outcomes Patient will complete the program meeting both personal and program goals.             ITP Comments:   Comments: ITP REVIEW Pt is making expected progress toward Cardiac Rehab goals after completing 8 sessions. Recommend continued exercise, life style modification, education, and increased stamina and strength.

## 2023-01-07 ENCOUNTER — Encounter (HOSPITAL_COMMUNITY): Payer: Medicare Other

## 2023-01-07 ENCOUNTER — Encounter (HOSPITAL_COMMUNITY)
Admission: RE | Admit: 2023-01-07 | Discharge: 2023-01-07 | Disposition: A | Discharge status: Home / Self Care | Payer: Medicare Other | Source: Ambulatory Visit | Attending: Cardiothoracic Surgery | Admitting: Cardiothoracic Surgery

## 2023-01-07 DIAGNOSIS — Z951 Presence of aortocoronary bypass graft: Secondary | ICD-10-CM | POA: Diagnosis not present

## 2023-01-07 NOTE — Progress Notes (Signed)
I have reviewed a Home Exercise Prescription with Sheila Keller . Sheila Keller is currently exercising at home.  The patient was advised to walk 2-4 days a week for 30-45 minutes.  Sheila Keller and I discussed how to progress their exercise prescription.  The patient stated that their goals were to get stronger and lose weight.  The patient stated that they understand the exercise prescription.  We reviewed exercise guidelines, target heart rate during exercise, RPE Scale, weather conditions, NTG use, endpoints for exercise, warmup and cool down.  Patient is encouraged to come to me with any questions. I will continue to follow up with the patient to assist them with progression and safety.    Hoy Register, MHA, M.S., ACSM CEP

## 2023-01-07 NOTE — Progress Notes (Signed)
Daily Session Note  Patient Details  Name: Sheila Keller MRN: [id number] Date of Birth: Apr 12, 1946 Referring Provider:   Flowsheet Row CARDIAC REHAB PHASE II ORIENTATION from 12/16/2022 in [facility]  Referring Provider Dr. Tenny Craw       Encounter Date: 01/07/2023  Check In:  Session Check In - 01/07/23 0923       Check-In   Supervising physician immediately available to respond to emergencies [facility] MD immediately available    Physician(s) Dr. Dellia Cloud    Location [name]    Staff Present Hoy Register MHA, MS, ACSM-CEP;Madelyn Flavors, RN, Jennye Moccasin, RN, BSN    Virtual Visit No    Medication changes reported     No    Fall or balance concerns reported    No    Tobacco Cessation No Change    Warm-up and Cool-down Performed as group-led instruction    Resistance Training Performed Yes    VAD Patient? No    PAD/SET Patient? No      Pain Assessment   Currently in Pain? No/denies    Multiple Pain Sites No             Capillary Blood Glucose: No results found for this or any previous visit (from the past 24 hour(s)).    Social History   Tobacco Use  Smoking Status Never  Smokeless Tobacco Never    Goals Met:  Independence with exercise equipment Exercise tolerated well No report of concerns or symptoms today Strength training completed today  Goals Unmet:  Not Applicable  Comments: Checkout at 1030.   Dr. Carlyle Dolly is Medical Director for [facility] Cardiac Rehab

## 2023-01-10 ENCOUNTER — Encounter (HOSPITAL_COMMUNITY): Payer: Medicare Other

## 2023-01-10 ENCOUNTER — Encounter (HOSPITAL_COMMUNITY)
Admission: RE | Admit: 2023-01-10 | Discharge: 2023-01-10 | Disposition: A | Discharge status: Home / Self Care | Payer: Medicare Other | Source: Ambulatory Visit | Attending: Cardiothoracic Surgery | Admitting: Cardiothoracic Surgery

## 2023-01-10 DIAGNOSIS — Z951 Presence of aortocoronary bypass graft: Secondary | ICD-10-CM | POA: Diagnosis not present

## 2023-01-10 NOTE — Progress Notes (Signed)
Daily Session Note  Patient Details  Name: Sheila Keller MRN: [id number] Date of Birth: Apr 27, 1946 Referring Provider:   Flowsheet Row CARDIAC REHAB PHASE II ORIENTATION from 12/16/2022 in [place]  Referring Provider Dr. Tenny Craw       Encounter Date: 01/10/2023  Check In:  Session Check In - 01/10/23 0930       Check-In   Supervising physician immediately available to respond to emergencies [facility] MD immediately available    Physician(s) Dr Harl Bowie    Location [name]    Staff Present Hoy Register MHA, MS, ACSM-CEP;Madelyn Flavors, RN, BSN;Heather Mel Almond, BS, Exercise Physiologist    Virtual Visit No    Medication changes reported     No    Fall or balance concerns reported    No    Tobacco Cessation No Change    Warm-up and Cool-down Performed as group-led instruction    Resistance Training Performed Yes    VAD Patient? No    PAD/SET Patient? No      Pain Assessment   Currently in Pain? No/denies    Multiple Pain Sites No             Capillary Blood Glucose: No results found for this or any previous visit (from the past 24 hour(s)).    Social History   Tobacco Use  Smoking Status Never  Smokeless Tobacco Never    Goals Met:  Independence with exercise equipment Exercise tolerated well No report of concerns or symptoms today Strength training completed today  Goals Unmet:  Not Applicable  Comments: Checkout at 1030.   Dr. Carlyle Dolly is Medical Director for [facility] Cardiac Rehab

## 2023-01-11 NOTE — Progress Notes (Unsigned)
Cardiology Office Note:    Date:  01/13/2023   ID:  Sheila Keller, DOB January 20, 1946, MRN [id number]  PCP:  Donald Prose, MD  Cardiologist:  None  Electrophysiologist:  None   Referring MD: Maury Dus, MD   Chief Complaint  Patient presents with   Coronary Artery Disease    History of Present Illness:    Sheila Keller is a 77 y.o. female with a hx of CAD status post CABG, aortic stenosis, hypertension, diabetes, hyperlipidemia who presents for follow-up. TTE on 03/05/2020 showed hyperdynamic LV systolic function, mild concentric LVH, grade 1 diastolic dysfunction, normal RV function, normal PASP, mild to moderate aortic stenosis.  Echocardiogram 03/17/2021 showed LVEF 70 to 20%, grade 1 diastolic dysfunction, normal RV function, moderate aortic stenosis.  Echocardiogram 03/30/2022 showed EF 70 to 35%, grade 1 diastolic dysfunction, LV mid cavitary gradient 22 mmHg (43 mmHg with Valsalva), normal RV function, moderate aortic stenosis (V-max 3 m/s, mean gradient 24 mmHg, AVA 0.8 cm, DI 0.34).  Cardiac PET scan on 08/24/2022 showed high risk findings, including lateral ischemia, decreased EF with stress, TID, severe three-vessel coronary calcifications, though absolute blood flows were normal.  LHC 09/01/2022 showed 80% distal left main stenosis.  She underwent CABG x3 on 09/20/2022 (LIMA to LAD, SVG-OM1, SVG-OM 2).   Since last clinic vist, she reports she is doing OK.  Reports some chest soreness, only lasts for few seconds.  Denies any dyspnea, lightheadedness, syncope, lower extremity edema, or palpitations.   Wt Readings from Last 3 Encounters:  01/13/23 154 lb 6.4 oz (70 kg)  01/03/23 153 lb 10.6 oz (69.7 kg)  12/20/22 154 lb 8.7 oz (70.1 kg)      Past Medical History:  Diagnosis Date   Anxiety    Breast lump 08/2017   no cancer/ [facility]   Chronic cystitis    Diabetes mellitus    type 2   Elevated cholesterol    Heart murmur    Aortic valve stenosis.   Sees cardiologist with [facility], no current problems per patient   Hypertension    Mild dysplasia of cervix (CIN I)    Osteopenia 02/2018   T score -1.8 FRAX 11% / 2.2%    Past Surgical History:  Procedure Laterality Date   BREAST BIOPSY Left    CATARACT EXTRACTION Bilateral    x2    CERVICAL BIOPSY  W/ LOOP ELECTRODE EXCISION  2009   CHOLECYSTECTOMY     colonoscopy     COLPOSCOPY     CORONARY ARTERY BYPASS GRAFT N/A 09/20/2022   Procedure: CORONARY ARTERY BYPASS GRAFTING (CABG) X3 USING ENDOSCOPICALLY HARVESTED RIGHT GREATER SAPHENOUS VEIN;  Surgeon: Neomia Glass, MD;  Location: [facility];  Service: Open Heart Surgery;  Laterality: N/A;   DILATION AND CURETTAGE OF UTERUS     IR THORACENTESIS ASP PLEURAL SPACE W/IMG GUIDE  10/08/2022   LEFT HEART CATH AND CORONARY ANGIOGRAPHY N/A 09/01/2022   Procedure: LEFT HEART CATH AND CORONARY ANGIOGRAPHY;  Surgeon: Belva Crome, MD;  Location: [place] [facility];  Service: Cardiovascular;  Laterality: N/A;   TEE WITHOUT CARDIOVERSION N/A 09/20/2022   Procedure: TRANSESOPHAGEAL ECHOCARDIOGRAM (TEE);  Surgeon: Neomia Glass, MD;  Location: [facility];  Service: Open Heart Surgery;  Laterality: N/A;   TUBAL LIGATION     vocal cord surgery     nodules excised    Current Medications: Current Meds  Medication Sig   aspirin EC 325 MG tablet Take 1 tablet (  325 mg total) by mouth daily.   atorvastatin (LIPITOR) 80 MG tablet Take 1 tablet (80 mg total) by mouth daily.   Cholecalciferol (VITAMIN D3) 2000 units TABS Take 2,000 Units by mouth every morning.   empagliflozin (JARDIANCE) 25 MG TABS tablet Take 25 mg by mouth daily.   metFORMIN (GLUCOPHAGE) 1000 MG tablet Take 1,000 mg by mouth 2 (two) times daily.   metoprolol tartrate (LOPRESSOR) 25 MG tablet Take 1 tablet (25 mg total) by mouth 2 (two) times daily.   sitaGLIPtin (JANUVIA) 100 MG tablet Take 100 mg by mouth daily.     Allergies:   Bee venom, Farxiga [dapagliflozin], and Penicillins    Social History   Socioeconomic History   Marital status: Divorced    Spouse name: Not on file   Number of children: Not on file   Years of education: Not on file   Highest education level: Not on file  Occupational History   Not on file  Tobacco Use   Smoking status: Never   Smokeless tobacco: Never  Vaping Use   Vaping Use: Never used  Substance and Sexual Activity   Alcohol use: No    Alcohol/week: 0.0 standard drinks of alcohol   Drug use: No   Sexual activity: Not Currently    Birth control/protection: Post-menopausal, Surgical    Comment: First sexual encounter at age 18. Less than 5 partners in her life.  Tubal Ligation  Other Topics Concern   Not on file  Social History Narrative   Not on file   Social Determinants of Health   Financial Resource Strain: Not on file  Food Insecurity: Not on file  Transportation Needs: Not on file  Physical Activity: Not on file  Stress: Not on file  Social Connections: Not on file     Family History: The patient's family history includes Breast cancer in her maternal aunt and mother; Diabetes in her mother and son; Heart attack in her son; Heart disease in her mother; Hypertension in her mother; Lung cancer in her father; Stroke in her mother.  ROS:   Please see the history of present illness.    All other systems reviewed and are negative.  EKGs/Labs/Other Studies Reviewed:    The following studies were reviewed today:   EKG:    10/13/2022: Normal sinus rhythm, rate 78, right axis deviation, T wave inversions in inferior and precordial leads 08/26/2022: Sinus tachycardia, rate 109, no ST abnormalities 04/06/22:  Normal sinus rhythm, rate 101, no ST abnormalities 02/18/2020: normal sinus rhythm, rate 108, right axis deviation, no ST abnormalities, Q waves in III, aVF 04/01/2021: Sinus rhythm. Rate 88 bpm. No ST abnormalities.  Recent Labs: 09/26/2022: Magnesium 1.9 10/13/2022: ALT 10; BUN 19; Creatinine, Ser 0.84;  Hemoglobin 9.6; Platelets 364; Potassium 5.2; Sodium 144  Recent Lipid Panel    Component Value Date/Time   CHOL 110 10/13/2022 1201   TRIG 145 10/13/2022 1201   HDL 43 10/13/2022 1201   CHOLHDL 2.6 10/13/2022 1201   LDLCALC 42 10/13/2022 1201    Physical Exam:    VS:  BP 134/76 (BP Location: Left Arm, Patient Position: Sitting, Cuff Size: Normal)   Pulse 97   Ht 5' 3.5" (1.613 m)   Wt 154 lb 6.4 oz (70 kg)   LMP  (LMP Unknown)   SpO2 97%   BMI 26.92 kg/m     Wt Readings from Last 3 Encounters:  01/13/23 154 lb 6.4 oz (70 kg)  01/03/23 153 lb 10.6 oz (  69.7 kg)  12/20/22 154 lb 8.7 oz (70.1 kg)     GEN:  in no acute distress HEENT: Normal NECK: No JVD CARDIAC: RRR, 3/6 systolic heart murmur loudest at the RUSB RESPIRATORY: CTAB ABDOMEN: Soft, non-tender, non-distended MUSCULOSKELETAL:  No edema; No deformity  SKIN: Warm and dry NEUROLOGIC:  Alert and oriented x 3 PSYCHIATRIC:  Normal affect   ASSESSMENT:    1. Coronary artery disease involving native coronary artery of native heart without angina pectoris   2. Aortic valve stenosis, etiology of cardiac valve disease unspecified   3. Essential hypertension   4. Hyperlipidemia, unspecified hyperlipidemia type      PLAN:    CAD: Calcium score 1176 (95th percentile) on 05/25/2022.  Denied any chest pain or dyspnea.  Cardiac PET scan on 08/24/2022 showed high risk findings, including lateral ischemia, decreased EF with stress, TID, severe three-vessel coronary calcifications, though absolute blood flows were normal. LHC 09/01/2022 showed 80% distal left main stenosis.  She underwent CABG x3 on 09/20/2022 (LIMA to LAD, SVG-OM1, SVG-OM 2). -Continue aspirin  -Continue atorvastatin 80 mg daily -Cardiac rehab  Aortic stenosis: Echocardiogram 03/17/2021 showed moderate aortic stenosis (V-max 3 m/s, mean gradient 20 mmHg, AVA 1.0 cm, DI 0.3).  Echocardiogram 03/30/2022 showed moderate aortic stenosis (V-max 3.0 m/s, mean  gradient 24 mmHg, AVA 0.8 cm, DI 0.34).  Dr Tenny Craw elected not to perform AVR at time of CABG, recommended TAVR evaluation when AS progresses to severe -Repeat echocardiogram for monitoring scheduled 04/06/2023  Left pleural effusion: Underwent thoracentesis 10/08/2022.  Chest x-ray 11/25/2022 showed small left pleural effusion  Hypertension: On metoprolol 25 mg twice daily.  Appears controlled  Hyperlipidemia: On atorvastatin 80 mg daily.  LDL 42 on 10/13/2022  Type 2 diabetes: On Metformin, Januvia, Jardiance.  A1c 7.4 on 08/2022   RTC in 6 months    Medication Adjustments/Labs and Tests Ordered: Current medicines are reviewed at length with the patient today.  Concerns regarding medicines are outlined above.  No orders of the defined types were placed in this encounter.  No orders of the defined types were placed in this encounter.   Patient Instructions  Medication Instructions:  Your physician recommends that you continue on your current medications as directed. Please refer to the Current Medication list given to you today.  *If you need a refill on your cardiac medications before your next appointment, please call your pharmacy*  Follow-Up: At [facility], you and your health needs are our priority.  As part of our continuing mission to provide you with exceptional heart care, we have created designated Provider Care Teams.  These Care Teams include your primary Cardiologist (physician) and Advanced Practice Providers (APPs -  Physician Assistants and Nurse Practitioners) who all work together to provide you with the care you need, when you need it.  We recommend signing up for the patient portal called "MyChart".  Sign up information is provided on this After Visit Summary.  MyChart is used to connect with patients for Virtual Visits (Telemedicine).  Patients are able to view lab/test results, encounter notes, upcoming appointments, etc.  Non-urgent messages can be sent  to your provider as well.   To learn more about what you can do with MyChart, go to [email or web address].    Your next appointment:   6 month(s)  Provider:   Dr. Gardiner Rhyme       Signed, Donato Heinz, MD  01/13/2023 4:20 PM    [place]

## 2023-01-12 ENCOUNTER — Encounter (HOSPITAL_COMMUNITY)
Admission: RE | Admit: 2023-01-12 | Discharge: 2023-01-12 | Disposition: A | Discharge status: Home / Self Care | Payer: Medicare Other | Source: Ambulatory Visit | Attending: Cardiothoracic Surgery | Admitting: Cardiothoracic Surgery

## 2023-01-12 ENCOUNTER — Encounter (HOSPITAL_COMMUNITY): Payer: Medicare Other

## 2023-01-12 DIAGNOSIS — Z951 Presence of aortocoronary bypass graft: Secondary | ICD-10-CM

## 2023-01-12 NOTE — Progress Notes (Signed)
Daily Session Note  Patient Details  Name: Sheila Keller MRN: [id number] Date of Birth: 1945/12/14 Referring Provider:   Flowsheet Row CARDIAC REHAB PHASE II ORIENTATION from 12/16/2022 in [place]  Referring Provider Dr. Tenny Craw       Encounter Date: 01/12/2023  Check In:  Session Check In - 01/12/23 0930       Check-In   Supervising physician immediately available to respond to emergencies [facility] MD immediately available    Physician(s) Dr Harl Bowie    Location [name]    Staff Present Hoy Register MHA, MS, ACSM-CEP;[employer] BSN, RN;Heather Mel Almond, [place], Exercise Physiologist    Virtual Visit No    Medication changes reported     No    Fall or balance concerns reported    No    Tobacco Cessation No Change    Warm-up and Cool-down Performed as group-led instruction    Resistance Training Performed Yes    VAD Patient? No    PAD/SET Patient? No      Pain Assessment   Currently in Pain? No/denies    Multiple Pain Sites No             Capillary Blood Glucose: No results found for this or any previous visit (from the past 24 hour(s)).    Social History   Tobacco Use  Smoking Status Never  Smokeless Tobacco Never    Goals Met:  Independence with exercise equipment Exercise tolerated well No report of concerns or symptoms today Strength training completed today  Goals Unmet:  Not Applicable  Comments: checkout time is 1030   Dr. Carlyle Dolly is Medical Director for [facility]

## 2023-01-13 ENCOUNTER — Encounter: Payer: Self-pay | Admitting: Cardiology

## 2023-01-13 ENCOUNTER — Ambulatory Visit: Payer: Medicare Other | Attending: Cardiology | Admitting: Cardiology

## 2023-01-13 VITALS — BP 134/76 | HR 97 | Ht 63.5 in | Wt 154.4 lb

## 2023-01-13 DIAGNOSIS — I10 Essential (primary) hypertension: Secondary | ICD-10-CM

## 2023-01-13 DIAGNOSIS — E785 Hyperlipidemia, unspecified: Secondary | ICD-10-CM

## 2023-01-13 DIAGNOSIS — I350 Nonrheumatic aortic (valve) stenosis: Secondary | ICD-10-CM

## 2023-01-13 DIAGNOSIS — I2510 Atherosclerotic heart disease of native coronary artery without angina pectoris: Secondary | ICD-10-CM

## 2023-01-13 NOTE — Patient Instructions (Signed)
Medication Instructions:  Your physician recommends that you continue on your current medications as directed. Please refer to the Current Medication list given to you today.  *If you need a refill on your cardiac medications before your next appointment, please call your pharmacy*  Follow-Up: At [facility], you and your health needs are our priority.  As part of our continuing mission to provide you with exceptional heart care, we have created designated Provider Care Teams.  These Care Teams include your primary Cardiologist (physician) and Advanced Practice Providers (APPs -  Physician Assistants and Nurse Practitioners) who all work together to provide you with the care you need, when you need it.  We recommend signing up for the patient portal called "MyChart".  Sign up information is provided on this After Visit Summary.  MyChart is used to connect with patients for Virtual Visits (Telemedicine).  Patients are able to view lab/test results, encounter notes, upcoming appointments, etc.  Non-urgent messages can be sent to your provider as well.   To learn more about what you can do with MyChart, go to [email or web address].    Your next appointment:   6 month(s)  Provider:   Dr. Schumann  

## 2023-01-14 ENCOUNTER — Encounter (HOSPITAL_COMMUNITY): Payer: Medicare Other

## 2023-01-14 ENCOUNTER — Telehealth: Payer: Self-pay | Admitting: Cardiology

## 2023-01-14 ENCOUNTER — Encounter (HOSPITAL_COMMUNITY)
Admission: RE | Admit: 2023-01-14 | Discharge: 2023-01-14 | Disposition: A | Discharge status: Home / Self Care | Payer: Medicare Other | Source: Ambulatory Visit | Attending: Cardiothoracic Surgery | Admitting: Cardiothoracic Surgery

## 2023-01-14 DIAGNOSIS — Z951 Presence of aortocoronary bypass graft: Secondary | ICD-10-CM

## 2023-01-14 NOTE — Telephone Encounter (Signed)
Pt was calling to inform our office that her medication list she went over with the CMA at yesterday's office visit with Dr. Gardiner Rhyme is incorrect.   She said her metformin is listed twice and she is not taking the metformin 1000 mg tablets by mouth twice daily, she is taking metformin 500 mg tablets -take 2 tablets (1000 mg total) by mouth twice daily with meals.  She said she takes the 500 mg tablets and not the 1000 mg tablets, and wants this corrected in her med list.  Updated this in her med list to reflect that she is taking metformin 500 mg tablets--instructions taking 2 tablets (1000 mg total) by mouth twice daily with meals.  She also wanted the following medications marked as not taking, for she either completed them or they were discontinued by an outside MD.  Docusate sodium, lasix, risedronate.  This was updated accordingly for the pt.     Pt also wanted to ask Dr. Gardiner Rhyme and RN if there is any contraindication with recent CABG she had back in Oct 2023 and proceeding with getting her scheduled mammogram on Feb 02, 2023.  Pt states if Dr. Gardiner Rhyme would advise on this and have his RN call back to let her know, so that she can keep/cancel the mammogram on 2/28.  Pt states Dr. Newman Nickels RN can leave a detailed message on her voicemail giving this answer, for she will more than likely be away from the phone due to attendance with cardiac rehab.   Pt aware that I will forward this request to Dr. Gardiner Rhyme and RN for further advisement and follow-up with the pt, when they return to the office.  She is aware that I will endorse to his RN in this message that she can leave her a detailed message about this.   Pt verbalized understanding and agrees with this plan.

## 2023-01-14 NOTE — Telephone Encounter (Signed)
Pt c/o medication issue:  1. Name of Medication: Metformin 1000 mg  2. How are you currently taking this medication (dosage and times per day)?   3. Are you having a reaction (difficulty breathing--STAT)?   4. What is your medication issue? This medicine should not be on.medicine list. Shewould like thi to be removed please.ne list- she says she takes Metformin 500 mg 2 tablets in the morning and 2 in the evening - total of 2000 mg a day     She would like for a nurse to call her please.today. She said she needs to discuss some of her other medicine.

## 2023-01-14 NOTE — Progress Notes (Signed)
Daily Session Note  Patient Details  Name: Sheila Keller MRN: [id number] Date of Birth: Mar 27, 1946 Referring Provider:   Flowsheet Row CARDIAC REHAB PHASE II ORIENTATION from 12/16/2022 in [place]  Referring Provider Dr. Tenny Craw       Encounter Date: 01/14/2023  Check In:  Session Check In - 01/14/23 0928       Check-In   Supervising physician immediately available to respond to emergencies [facility] MD immediately available    Physician(s) Dr Harl Bowie    Location [name]    Staff Present Hoy Register MHA, MS, ACSM-CEP;Leana Roe, BS, Exercise Physiologist;Phyllis Billingsley, RN;Bertha Lokken Hassell Done, RN, BSN    Virtual Visit No    Medication changes reported     No    Fall or balance concerns reported    No    Tobacco Cessation No Change    Warm-up and Cool-down Performed as group-led instruction    Resistance Training Performed Yes    VAD Patient? No    PAD/SET Patient? No      Pain Assessment   Currently in Pain? No/denies    Pain Score 0-No pain    Multiple Pain Sites No             Capillary Blood Glucose: No results found for this or any previous visit (from the past 24 hour(s)).    Social History   Tobacco Use  Smoking Status Never  Smokeless Tobacco Never    Goals Met:  Independence with exercise equipment Exercise tolerated well No report of concerns or symptoms today Strength training completed today  Goals Unmet:  Not Applicable  Comments: Checkout at 1030.   Dr. Carlyle Dolly is Medical Director for [facility] Card[facility]

## 2023-01-16 NOTE — Telephone Encounter (Signed)
Yes she can proceed with mammogram

## 2023-01-17 ENCOUNTER — Encounter (HOSPITAL_COMMUNITY)
Admission: RE | Admit: 2023-01-17 | Discharge: 2023-01-17 | Disposition: A | Discharge status: Home / Self Care | Payer: Medicare Other | Source: Ambulatory Visit | Attending: Cardiothoracic Surgery | Admitting: Cardiothoracic Surgery

## 2023-01-17 ENCOUNTER — Encounter (HOSPITAL_COMMUNITY): Payer: Medicare Other

## 2023-01-17 VITALS — Wt 152.6 lb

## 2023-01-17 DIAGNOSIS — Z951 Presence of aortocoronary bypass graft: Secondary | ICD-10-CM | POA: Diagnosis not present

## 2023-01-17 NOTE — Progress Notes (Signed)
Daily Session Note  Patient Details  Name: Sheila Keller MRN: [id number] Date of Birth: 04-08-1946 Referring Provider:   Flowsheet Row CARDIAC REHAB PHASE II ORIENTATION from 12/16/2022 in [place]  Referring Provider Dr. Tenny Craw       Encounter Date: 01/17/2023  Check In:  Session Check In - 01/17/23 0930       Check-In   Supervising physician immediately available to respond to emergencies [facility] MD immediately available    Physician(s) Dr. Domenic Polite    Location [name]    Staff Present Hoy Register MHA, MS, ACSM-CEP;Leana Roe, BS, Exercise Physiologist;Bailynn Dyk, Kermit Balo, RN, BSN    Virtual Visit No    Medication changes reported     No    Fall or balance concerns reported    No    Tobacco Cessation No Change    Warm-up and Cool-down Performed as group-led instruction    Resistance Training Performed Yes    VAD Patient? No    PAD/SET Patient? No      Pain Assessment   Currently in Pain? No/denies    Pain Score 0-No pain    Multiple Pain Sites No             Capillary Blood Glucose: No results found for this or any previous visit (from the past 24 hour(s)).    Social History   Tobacco Use  Smoking Status Never  Smokeless Tobacco Never    Goals Met:  Independence with exercise equipment Exercise tolerated well No report of concerns or symptoms today  Goals Unmet:  Not Applicable  Comments: check out @ 10:30am   Dr. Carlyle Dolly is Medical Director for [facility]

## 2023-01-18 NOTE — Telephone Encounter (Signed)
Left detailed message to make patient aware ok to proceed with mammogram per MD (ok per DPR). Advised to call back with questions or concerns.

## 2023-01-19 ENCOUNTER — Encounter (HOSPITAL_COMMUNITY): Payer: Medicare Other

## 2023-01-19 ENCOUNTER — Encounter (HOSPITAL_COMMUNITY)
Admission: RE | Admit: 2023-01-19 | Discharge: 2023-01-19 | Disposition: A | Discharge status: Home / Self Care | Payer: Medicare Other | Source: Ambulatory Visit | Attending: Cardiothoracic Surgery | Admitting: Cardiothoracic Surgery

## 2023-01-19 DIAGNOSIS — Z951 Presence of aortocoronary bypass graft: Secondary | ICD-10-CM | POA: Diagnosis not present

## 2023-01-19 NOTE — Progress Notes (Signed)
Daily Session Note  Patient Details  Name: Sheila Keller MRN: [id number] Date of Birth: [birth year] Referring Provider:   Flowsheet Row CARDIAC REHAB PHASE II ORIENTATION from 12/16/2022 in [place]  Referring Provider Dr. Tenny Craw       Encounter Date: 01/19/2023  Check In:  Session Check In - 01/19/23 0926       Check-In   Supervising physician immediately available to respond to emergencies [facility] MD immediately available    Physician(s) Dr. Domenic Polite    Location [name]    Staff Present Hoy Register MHA, MS, ACSM-CEP;[employer] BSN, RN;Shastina Rua Wynetta Emery, RN, BSN    Virtual Visit No    Medication changes reported     No    Fall or balance concerns reported    No    Tobacco Cessation No Change    Warm-up and Cool-down Performed as group-led Higher education careers adviser Performed Yes    VAD Patient? No    PAD/SET Patient? No      Pain Assessment   Currently in Pain? No/denies    Pain Score 0-No pain    Multiple Pain Sites No             Capillary Blood Glucose: No results found for this or any previous visit (from the past 24 hour(s)).    Social History   Tobacco Use  Smoking Status Never  Smokeless Tobacco Never    Goals Met:  Independence with exercise equipment Exercise tolerated well No report of concerns or symptoms today Strength training completed today  Goals Unmet:  Not Applicable  Comments: Check out 1030.   Dr. Carlyle Dolly is Medical Director for [facility]

## 2023-01-20 DIAGNOSIS — I10 Essential (primary) hypertension: Secondary | ICD-10-CM | POA: Diagnosis not present

## 2023-01-20 DIAGNOSIS — Z951 Presence of aortocoronary bypass graft: Secondary | ICD-10-CM | POA: Diagnosis not present

## 2023-01-20 DIAGNOSIS — E7800 Pure hypercholesterolemia, unspecified: Secondary | ICD-10-CM | POA: Diagnosis not present

## 2023-01-20 DIAGNOSIS — I25119 Atherosclerotic heart disease of native coronary artery with unspecified angina pectoris: Secondary | ICD-10-CM | POA: Diagnosis not present

## 2023-01-20 DIAGNOSIS — I350 Nonrheumatic aortic (valve) stenosis: Secondary | ICD-10-CM | POA: Diagnosis not present

## 2023-01-21 ENCOUNTER — Encounter (HOSPITAL_COMMUNITY): Payer: Medicare Other

## 2023-01-21 ENCOUNTER — Encounter (HOSPITAL_COMMUNITY)
Admission: RE | Admit: 2023-01-21 | Discharge: 2023-01-21 | Disposition: A | Discharge status: Home / Self Care | Payer: Medicare Other | Source: Ambulatory Visit | Attending: Cardiothoracic Surgery | Admitting: Cardiothoracic Surgery

## 2023-01-21 DIAGNOSIS — Z951 Presence of aortocoronary bypass graft: Secondary | ICD-10-CM | POA: Diagnosis not present

## 2023-01-21 LAB — GLUCOSE, CAPILLARY: Glucose-Capillary: 141 mg/dL — ABNORMAL HIGH (ref 70–99)

## 2023-01-21 NOTE — Progress Notes (Signed)
Daily Session Note  Patient Details  Name: Sheila Keller MRN: [id number] Date of Birth: [birth year] Referring Provider:   Flowsheet Row CARDIAC REHAB PHASE II ORIENTATION from 12/16/2022 in [place]  Referring Provider Dr. Tenny Craw       Encounter Date: 01/21/2023  Check In:  Session Check In - 01/21/23 0930       Check-In   Supervising physician immediately available to respond to emergencies [facility] MD immediately available    Physician(s) Dr Johnsie Cancel    Location [name]    Staff Present Leana Roe, BS, Exercise Physiologist;Crescentia Boutwell Hassell Done, RN, Jennye Moccasin, RN, BSN    Virtual Visit No    Medication changes reported     No    Fall or balance concerns reported    No    Tobacco Cessation No Change    Warm-up and Cool-down Performed as group-led instruction    Resistance Training Performed Yes    VAD Patient? No    PAD/SET Patient? No      Pain Assessment   Currently in Pain? No/denies    Pain Score 0-No pain    Multiple Pain Sites No             Capillary Blood Glucose: Results for orders placed or performed during the hospital encounter of 01/21/23 (from the past 24 hour(s))  Glucose, capillary     Status: Abnormal   Collection Time: 01/21/23  9:22 AM  Result Value Ref Range   Glucose-Capillary 141 (H) 70 - 99 mg/dL      Social History   Tobacco Use  Smoking Status Never  Smokeless Tobacco Never    Goals Met:  Independence with exercise equipment Exercise tolerated well No report of concerns or symptoms today Strength training completed today  Goals Unmet:  Not Applicable  Comments: Checkout at 1030.   Dr. Carlyle Dolly is Medical Director for [facility]

## 2023-01-24 ENCOUNTER — Encounter (HOSPITAL_COMMUNITY): Payer: Medicare Other

## 2023-01-24 ENCOUNTER — Encounter (HOSPITAL_COMMUNITY)
Admission: RE | Admit: 2023-01-24 | Discharge: 2023-01-24 | Disposition: A | Discharge status: Home / Self Care | Payer: Medicare Other | Source: Ambulatory Visit | Attending: Cardiothoracic Surgery | Admitting: Cardiothoracic Surgery

## 2023-01-24 DIAGNOSIS — Z951 Presence of aortocoronary bypass graft: Secondary | ICD-10-CM

## 2023-01-24 NOTE — Progress Notes (Signed)
Daily Session Note  Patient Details  Name: NIYA ARNOUX MRN: [id number] Date of Birth: 06/21/1946 Referring Provider:   Flowsheet Row CARDIAC REHAB PHASE II ORIENTATION from 12/16/2022 in [place]  Referring Provider Dr. Tenny Craw       Encounter Date: 01/24/2023  Check In:  Session Check In - 01/24/23 0924       Check-In   Supervising physician immediately available to respond to emergencies [facility] MD immediately available    Physician(s) Dr Dellia Cloud    Location [name]    Staff Present Leana Roe, BS, Exercise Physiologist;Broghan Pannone Hassell Done, RN, BSN;Dalton Sherrie George, MS, ACSM-CEP    Virtual Visit No    Medication changes reported     No    Fall or balance concerns reported    No    Tobacco Cessation No Change    Warm-up and Cool-down Performed as group-led instruction    Resistance Training Performed Yes    VAD Patient? No    PAD/SET Patient? No      Pain Assessment   Currently in Pain? No/denies    Pain Score 0-No pain    Multiple Pain Sites No             Capillary Blood Glucose: No results found for this or any previous visit (from the past 24 hour(s)).    Social History   Tobacco Use  Smoking Status Never  Smokeless Tobacco Never    Goals Met:  Independence with exercise equipment Exercise tolerated well No report of concerns or symptoms today Strength training completed today  Goals Unmet:  Not Applicable  Comments: Checkout at 1030.   Dr. Carlyle Dolly is Medical Director for [facility] Card[facility]

## 2023-01-26 ENCOUNTER — Encounter (HOSPITAL_COMMUNITY): Payer: Medicare Other

## 2023-01-26 ENCOUNTER — Encounter (HOSPITAL_COMMUNITY)
Admission: RE | Admit: 2023-01-26 | Discharge: 2023-01-26 | Disposition: A | Discharge status: Home / Self Care | Payer: Medicare Other | Source: Ambulatory Visit | Attending: Cardiothoracic Surgery | Admitting: Cardiothoracic Surgery

## 2023-01-26 DIAGNOSIS — Z951 Presence of aortocoronary bypass graft: Secondary | ICD-10-CM

## 2023-01-26 NOTE — Progress Notes (Signed)
Daily Session Note  Patient Details  Name: Sheila Keller MRN: [id number] Date of Birth: 03/31/1946 Referring Provider:   Flowsheet Row CARDIAC REHAB PHASE II ORIENTATION from 12/16/2022 in [place]  Referring Provider Dr. Tenny Craw       Encounter Date: 01/26/2023  Check In:  Session Check In - 01/26/23 0930       Check-In   Supervising physician immediately available to respond to emergencies [facility] MD immediately available    Physician(s) Dr Dellia Cloud    Location [name]    Staff Present Hoy Register MHA, MS, ACSM-CEP;Leana Roe, BS, Exercise Physiologist    Virtual Visit No    Medication changes reported     No    Fall or balance concerns reported    No    Tobacco Cessation No Change    Warm-up and Cool-down Performed as group-led instruction    Resistance Training Performed Yes    VAD Patient? No    PAD/SET Patient? No      Pain Assessment   Currently in Pain? No/denies    Pain Score 0-No pain    Multiple Pain Sites No             Capillary Blood Glucose: No results found for this or any previous visit (from the past 24 hour(s)).    Social History   Tobacco Use  Smoking Status Never  Smokeless Tobacco Never    Goals Met:  Independence with exercise equipment Exercise tolerated well No report of concerns or symptoms today Strength training completed today  Goals Unmet:  Not Applicable  Comments: checkout time is 1030   Dr. Carlyle Dolly is Medical Director for [place]

## 2023-01-28 ENCOUNTER — Encounter (HOSPITAL_COMMUNITY)
Admission: RE | Admit: 2023-01-28 | Discharge: 2023-01-28 | Disposition: A | Discharge status: Home / Self Care | Payer: Medicare Other | Source: Ambulatory Visit | Attending: Cardiothoracic Surgery | Admitting: Cardiothoracic Surgery

## 2023-01-28 ENCOUNTER — Encounter (HOSPITAL_COMMUNITY): Payer: Medicare Other

## 2023-01-28 DIAGNOSIS — Z951 Presence of aortocoronary bypass graft: Secondary | ICD-10-CM

## 2023-01-28 NOTE — Progress Notes (Signed)
Daily Session Note  Patient Details  Name: Sheila Keller MRN: [id number] Date of Birth: [birth year] Referring Provider:   Flowsheet Row CARDIAC REHAB PHASE II ORIENTATION from 12/16/2022 in [place]  Referring Provider Dr. Tenny Craw       Encounter Date: 01/28/2023  Check In:  Session Check In - 01/28/23 0921       Check-In   Supervising physician immediately available to respond to emergencies [facility] MD immediately available    Physician(s) Dr Dellia Cloud    Location [name]    Staff Present Hoy Register MHA, MS, ACSM-CEP;Leana Roe, BS, Exercise Physiologist;Proctor Carriker Wynetta Emery, RN, BSN    Virtual Visit No    Medication changes reported     No    Fall or balance concerns reported    No    Tobacco Cessation No Change    Warm-up and Cool-down Performed as group-led instruction    Resistance Training Performed Yes    VAD Patient? No    PAD/SET Patient? No      Pain Assessment   Currently in Pain? No/denies    Pain Score 0-No pain    Multiple Pain Sites No             Capillary Blood Glucose: No results found for this or any previous visit (from the past 24 hour(s)).    Social History   Tobacco Use  Smoking Status Never  Smokeless Tobacco Never    Goals Met:  Independence with exercise equipment Exercise tolerated well No report of concerns or symptoms today Strength training completed today  Goals Unmet:  Not Applicable  Comments: Check out 1030.   Dr. Carlyle Dolly is Medical Director for [facility] Card[facility] Re[facility]

## 2023-01-31 ENCOUNTER — Encounter (HOSPITAL_COMMUNITY): Payer: Medicare Other

## 2023-01-31 ENCOUNTER — Encounter (HOSPITAL_COMMUNITY)
Admission: RE | Admit: 2023-01-31 | Discharge: 2023-01-31 | Disposition: A | Discharge status: Home / Self Care | Payer: Medicare Other | Source: Ambulatory Visit | Attending: Cardiothoracic Surgery | Admitting: Cardiothoracic Surgery

## 2023-01-31 DIAGNOSIS — Z951 Presence of aortocoronary bypass graft: Secondary | ICD-10-CM | POA: Diagnosis not present

## 2023-02-02 ENCOUNTER — Ambulatory Visit: Payer: Medicare Other

## 2023-02-02 ENCOUNTER — Encounter (HOSPITAL_COMMUNITY)
Admission: RE | Admit: 2023-02-02 | Discharge: 2023-02-02 | Disposition: A | Discharge status: Home / Self Care | Payer: Medicare Other | Source: Ambulatory Visit | Attending: Cardiothoracic Surgery | Admitting: Cardiothoracic Surgery

## 2023-02-02 ENCOUNTER — Encounter (HOSPITAL_COMMUNITY): Payer: Medicare Other

## 2023-02-02 DIAGNOSIS — Z951 Presence of aortocoronary bypass graft: Secondary | ICD-10-CM

## 2023-02-02 NOTE — Progress Notes (Signed)
Cardiac Individual Treatment Plan  Patient Details  Name: Sheila Keller MRN: [id number] Date of Birth: Apr 13, 1946 Referring Provider:   Flowsheet Row CARDIAC REHAB PHASE II ORIENTATION from 12/16/2022 in [facility]  Referring Provider Dr. Tenny Craw       Initial Encounter Date:  Flowsheet Row CARDIAC REHAB PHASE II ORIENTATION from 12/16/2022 in [facility]  Date 12/16/22       Visit Diagnosis: No diagnosis found.  Patient's Home Medications on Admission:  Current Outpatient Medications:    aspirin EC 325 MG tablet, Take 1 tablet (325 mg total) by mouth daily., Disp: 30 tablet, Rfl: 2   atorvastatin (LIPITOR) 80 MG tablet, Take 1 tablet (80 mg total) by mouth daily., Disp: 90 tablet, Rfl: 3   Cholecalciferol (VITAMIN D3) 2000 units TABS, Take 2,000 Units by mouth every morning., Disp: , Rfl:    docusate sodium (COLACE) 100 MG capsule, Take 100 mg by mouth daily. (Patient not taking: Reported on 01/13/2023), Disp: , Rfl:    empagliflozin (JARDIANCE) 25 MG TABS tablet, Take 25 mg by mouth daily., Disp: , Rfl:    furosemide (LASIX) 20 MG tablet, Take 0.5 tablets (10 mg total) by mouth daily. Take 1/2 tablet of the 20 mg per day (10 mg po x7 days) (Patient not taking: Reported on 12/15/2022), Disp: 7 tablet, Rfl: 0   metFORMIN (GLUCOPHAGE) 1000 MG tablet, Take 1,000 mg by mouth 2 (two) times daily. (Patient not taking: Reported on 01/14/2023), Disp: , Rfl:    metFORMIN (GLUCOPHAGE) 500 MG tablet, Take 1,000 mg by mouth 2 (two) times daily with a meal., Disp: , Rfl: 1   metoprolol tartrate (LOPRESSOR) 25 MG tablet, Take 1 tablet (25 mg total) by mouth 2 (two) times daily., Disp: 90 tablet, Rfl: 2   risedronate (ACTONEL) 150 MG tablet, PLEASE SEE ATTACHED FOR DETAILED DIRECTIONS (Patient not taking: Reported on 01/13/2023), Disp: 3 tablet, Rfl: 1   sitaGLIPtin (JANUVIA) 100 MG tablet, Take 100 mg by mouth daily., Disp: , Rfl:   Past Medical  History: Past Medical History:  Diagnosis Date   Anxiety    Breast lump 08/2017   no cancer/ [facility]   Chronic cystitis    Diabetes mellitus    type 2   Elevated cholesterol    Heart murmur    Aortic valve stenosis.  Sees cardiologist with [facility], no current problems per patient   Hypertension    Mild dysplasia of cervix (CIN I)    Osteopenia 02/2018   T score -1.8 FRAX 11% / 2.2%    Tobacco Use: Social History   Tobacco Use  Smoking Status Never  Smokeless Tobacco Never    Labs: Review Flowsheet       Latest Ref Rng & Units 12/28/2007 09/16/2022 09/20/2022 10/13/2022  Labs for ITP Cardiac and Pulmonary Rehab  Cholestrol 100 - 199 mg/dL - - - 110   LDL (calc) 0 - 99 mg/dL - - - 42   HDL-C >39 mg/dL - - - 43   Trlycerides 0 - 149 mg/dL - - - 145   Hemoglobin A1c 4.8 - 5.6 % - 7.0  - -  PH, Arterial 7.35 - 7.45 - 7.42  7.321  7.390  7.386  7.440  7.504  7.378  7.307  -  PCO2 arterial 32 - 48 mmHg - 29  39.8  33.4  34.1  31.0  30.2  38.1  40.2  -  Bicarbonate 20.0 - 28.0 mmol/L  19.7  18.8  20.6  20.2  20.5  21.1  23.8  19.7  22.5  20.1  -  TCO2 22 - 32 mmol/L 21  - '22  21  22  22  23  22  23  25  21  21  24  21  21  21  '$ -  Acid-base deficit 0.0 - 2.0 mmol/L 3.0  4.4  5.0  4.0  4.0  3.0  4.0  2.0  6.0  -  O2 Saturation % - 99  99  99  59.9  100  100  100  74  100  100  -    Capillary Blood Glucose: Lab Results  Component Value Date   GLUCAP 141 (H) 01/21/2023   GLUCAP 151 (H) 12/22/2022   GLUCAP 162 (H) 12/20/2022   GLUCAP 119 (H) 09/27/2022   GLUCAP 118 (H) 09/27/2022    POCT Glucose     Row Name 12/16/22 1232             POCT Blood Glucose   Pre-Exercise 155 mg/dL                Exercise Target Goals: Exercise Program Goal: Individual exercise prescription set using results from initial 6 min walk test and THRR while considering  patient's activity barriers and safety.   Exercise Prescription Goal: Starting with aerobic  activity 30 plus minutes a day, 3 days per week for initial exercise prescription. Provide home exercise prescription and guidelines that participant acknowledges understanding prior to discharge.  Activity Barriers & Risk Stratification:  Activity Barriers & Cardiac Risk Stratification - 12/16/22 1303       Activity Barriers & Cardiac Risk Stratification   Activity Barriers None    Cardiac Risk Stratification High             6 Minute Walk:  6 Minute Walk     Row Name 12/16/22 1306         6 Minute Walk   Phase Initial     Distance 1100 feet     Walk Time 6 minutes     # of Rest Breaks 0     MPH 2.08     METS 2.83     RPE 12     VO2 Peak 9.92     Symptoms No     Resting HR 84 bpm     Resting BP 92/60     Resting Oxygen Saturation  97 %     Exercise Oxygen Saturation  during 6 min walk 97 %     Max Ex. HR 98 bpm     Max Ex. BP 110/60     2 Minute Post BP 106/60              Oxygen Initial Assessment:   Oxygen Re-Evaluation:   Oxygen Discharge (Final Oxygen Re-Evaluation):   Initial Exercise Prescription:  Initial Exercise Prescription - 12/16/22 1300       Date of Initial Exercise RX and Referring Provider   Date 12/16/22    Referring Provider Dr. Tenny Craw    Expected Discharge Date 03/11/23      NuStep   Level 1    SPM 80    Minutes 22      Arm Ergometer   Level 1    RPM 45    Minutes 17      Prescription Details   Frequency (times per week) 3    Duration Progress to 30  minutes of continuous aerobic without signs/symptoms of physical distress      Intensity   THRR 40-80% of Max Heartrate 58-115    Ratings of Perceived Exertion 11-13    Perceived Dyspnea 0-4      Resistance Training   Training Prescription Yes    Weight 3    Reps 10-15             Perform Capillary Blood Glucose checks as needed.  Exercise Prescription Changes:   Exercise Prescription Changes     Row Name 12/20/22 1200 01/03/23 1500 01/07/23 1000  01/17/23 1400 01/31/23 1000     Response to Exercise   Blood Pressure (Admit) 106/66 112/60 -- 112/60 108/56   Blood Pressure (Exercise) 110/60 110/62 -- 118/62 104/58   Blood Pressure (Exit) 108/60 96/64 -- 108/60 104/70   Heart Rate (Admit) 84 bpm 74 bpm -- 72 bpm 92 bpm   Heart Rate (Exercise) 93 bpm 105 bpm -- 96 bpm 116 bpm   Heart Rate (Exit) 91 bpm 83 bpm -- 80 bpm 97 bpm   Rating of Perceived Exertion (Exercise) 13 12 -- 12 12   Duration Continue with 30 min of aerobic exercise without signs/symptoms of physical distress. Continue with 30 min of aerobic exercise without signs/symptoms of physical distress. -- Continue with 30 min of aerobic exercise without signs/symptoms of physical distress. Continue with 30 min of aerobic exercise without signs/symptoms of physical distress.   Intensity THRR unchanged THRR unchanged -- THRR unchanged THRR unchanged     Progression   Progression Continue to progress workloads to maintain intensity without signs/symptoms of physical distress. Continue to progress workloads to maintain intensity without signs/symptoms of physical distress. -- Continue to progress workloads to maintain intensity without signs/symptoms of physical distress. Continue to progress workloads to maintain intensity without signs/symptoms of physical distress.     Resistance Training   Training Prescription Yes Yes -- Yes Yes   Weight 2 2 -- 2.0 2   Reps 10-15 10-15 -- 10-15 10-15   Time 10 Minutes 10 Minutes -- 10 Minutes 10 Minutes     NuStep   Level 1 1 -- 2 3   SPM 43 93 -- 96 96   Minutes 39 39 -- 39 39   METs 1.52 2.63 -- 2.64 2.84     Home Exercise Plan   Plans to continue exercise at -- -- [employer] (comment) -- --   Frequency -- -- Add 2 additional days to program exercise sessions. -- --   Initial Home Exercises Provided -- -- 01/07/23 -- --            Exercise Comments:   Exercise Comments     Row Name 01/07/23 1004            Exercise Comments home exercise reviewed                Exercise Goals and Review:   Exercise Goals     Row Name 12/16/22 1313 01/03/23 1545 01/31/23 1039         Exercise Goals   Increase Physical Activity Yes Yes Yes     Intervention Provide advice, education, support and counseling about physical activity/exercise needs.;Develop an individualized exercise prescription for aerobic and resistive training based on initial evaluation findings, risk stratification, comorbidities and participant's personal goals. Provide advice, education, support and counseling about physical activity/exercise needs.;Develop an individualized exercise prescription for aerobic and resistive training based on initial evaluation findings, risk stratification, comorbidities  and participant's personal goals. Provide advice, education, support and counseling about physical activity/exercise needs.;Develop an individualized exercise prescription for aerobic and resistive training based on initial evaluation findings, risk stratification, comorbidities and participant's personal goals.     Expected Outcomes Short Term: Attend rehab on a regular basis to increase amount of physical activity.;Long Term: Add in home exercise to make exercise part of routine and to increase amount of physical activity.;Long Term: Exercising regularly at least 3-5 days a week. Short Term: Attend rehab on a regular basis to increase amount of physical activity.;Long Term: Add in home exercise to make exercise part of routine and to increase amount of physical activity.;Long Term: Exercising regularly at least 3-5 days a week. Short Term: Attend rehab on a regular basis to increase amount of physical activity.;Long Term: Add in home exercise to make exercise part of routine and to increase amount of physical activity.;Long Term: Exercising regularly at least 3-5 days a week.     Increase Strength and Stamina Yes Yes Yes     Intervention Provide  advice, education, support and counseling about physical activity/exercise needs.;Develop an individualized exercise prescription for aerobic and resistive training based on initial evaluation findings, risk stratification, comorbidities and participant's personal goals. Provide advice, education, support and counseling about physical activity/exercise needs.;Develop an individualized exercise prescription for aerobic and resistive training based on initial evaluation findings, risk stratification, comorbidities and participant's personal goals. Provide advice, education, support and counseling about physical activity/exercise needs.;Develop an individualized exercise prescription for aerobic and resistive training based on initial evaluation findings, risk stratification, comorbidities and participant's personal goals.     Expected Outcomes Short Term: Increase workloads from initial exercise prescription for resistance, speed, and METs.;Short Term: Perform resistance training exercises routinely during rehab and add in resistance training at home;Long Term: Improve cardiorespiratory fitness, muscular endurance and strength as measured by increased METs and functional capacity (6MWT) Short Term: Increase workloads from initial exercise prescription for resistance, speed, and METs.;Short Term: Perform resistance training exercises routinely during rehab and add in resistance training at home;Long Term: Improve cardiorespiratory fitness, muscular endurance and strength as measured by increased METs and functional capacity (6MWT) Short Term: Increase workloads from initial exercise prescription for resistance, speed, and METs.;Short Term: Perform resistance training exercises routinely during rehab and add in resistance training at home;Long Term: Improve cardiorespiratory fitness, muscular endurance and strength as measured by increased METs and functional capacity (6MWT)     Able to understand and use rate of  perceived exertion (RPE) scale Yes Yes Yes     Intervention Provide education and explanation on how to use RPE scale Provide education and explanation on how to use RPE scale Provide education and explanation on how to use RPE scale     Expected Outcomes Short Term: Able to use RPE daily in rehab to express subjective intensity level;Long Term:  Able to use RPE to guide intensity level when exercising independently Short Term: Able to use RPE daily in rehab to express subjective intensity level;Long Term:  Able to use RPE to guide intensity level when exercising independently Short Term: Able to use RPE daily in rehab to express subjective intensity level;Long Term:  Able to use RPE to guide intensity level when exercising independently     Knowledge and understanding of Target Heart Rate Range (THRR) Yes Yes Yes     Intervention Provide education and explanation of THRR including how the numbers were predicted and where they are located for reference Provide education and explanation  of THRR including how the numbers were predicted and where they are located for reference Provide education and explanation of THRR including how the numbers were predicted and where they are located for reference     Expected Outcomes Long Term: Able to use THRR to govern intensity when exercising independently;Short Term: Able to state/look up THRR;Short Term: Able to use daily as guideline for intensity in rehab Long Term: Able to use THRR to govern intensity when exercising independently;Short Term: Able to state/look up THRR;Short Term: Able to use daily as guideline for intensity in rehab Long Term: Able to use THRR to govern intensity when exercising independently;Short Term: Able to state/look up THRR;Short Term: Able to use daily as guideline for intensity in rehab     Able to check pulse independently Yes Yes Yes     Intervention Provide education and demonstration on how to check pulse in carotid and radial  arteries.;Review the importance of being able to check your own pulse for safety during independent exercise Provide education and demonstration on how to check pulse in carotid and radial arteries.;Review the importance of being able to check your own pulse for safety during independent exercise Provide education and demonstration on how to check pulse in carotid and radial arteries.;Review the importance of being able to check your own pulse for safety during independent exercise     Expected Outcomes Short Term: Able to explain why pulse checking is important during independent exercise;Long Term: Able to check pulse independently and accurately Short Term: Able to explain why pulse checking is important during independent exercise;Long Term: Able to check pulse independently and accurately Short Term: Able to explain why pulse checking is important during independent exercise;Long Term: Able to check pulse independently and accurately     Understanding of Exercise Prescription Yes Yes Yes     Intervention Provide education, explanation, and written materials on patient's individual exercise prescription Provide education, explanation, and written materials on patient's individual exercise prescription Provide education, explanation, and written materials on patient's individual exercise prescription     Expected Outcomes Short Term: Able to explain program exercise prescription;Long Term: Able to explain home exercise prescription to exercise independently Short Term: Able to explain program exercise prescription;Long Term: Able to explain home exercise prescription to exercise independently Short Term: Able to explain program exercise prescription;Long Term: Able to explain home exercise prescription to exercise independently              Exercise Goals Re-Evaluation :  Exercise Goals Re-Evaluation     Row Name 01/03/23 1545 01/31/23 1039           Exercise Goal Re-Evaluation   Exercise  Goals Review Increase Physical Activity;Able to understand and use rate of perceived exertion (RPE) scale;Increase Strength and Stamina;Knowledge and understanding of Target Heart Rate Range (THRR);Able to check pulse independently;Understanding of Exercise Prescription Increase Strength and Stamina;Increase Physical Activity;Able to understand and use rate of perceived exertion (RPE) scale;Knowledge and understanding of Target Heart Rate Range (THRR);Able to check pulse independently;Understanding of Exercise Prescription      Comments Pt has comepleted 8 sessions of CR. She stated that when using the arms on the stepper she felt it was pulling at her chest so we lowered the arms and told her to just use her legs. She just started using the arms again the past session and feels fine. She has increased her workload each class by increasing her SPM. She is currently exercising at 2.63 METs on the stepper.  Will continue to monitor and progress as able. Pt has completed 20 sessions of cardiac rehab. She is improving since she started the program and is egar to increase her workload. She continues to use her arms on the stepper and no longer states that is feels like her chest was pulling. She ask questions throughout the week about the stepper and is egar to be informed. She is currently exercising at 2.84 METs on the stepper. Will continue to monitor and progress as able.      Expected Outcomes Through exercise at rehab and home, the patient will meet their stated goals Through exercise at rehab and home, the patient will meet their stated goals                Discharge Exercise Prescription (Final Exercise Prescription Changes):  Exercise Prescription Changes - 01/31/23 1000       Response to Exercise   Blood Pressure (Admit) 108/56    Blood Pressure (Exercise) 104/58    Blood Pressure (Exit) 104/70    Heart Rate (Admit) 92 bpm    Heart Rate (Exercise) 116 bpm    Heart Rate (Exit) 97 bpm     Rating of Perceived Exertion (Exercise) 12    Duration Continue with 30 min of aerobic exercise without signs/symptoms of physical distress.    Intensity THRR unchanged      Progression   Progression Continue to progress workloads to maintain intensity without signs/symptoms of physical distress.      Resistance Training   Training Prescription Yes    Weight 2    Reps 10-15    Time 10 Minutes      NuStep   Level 3    SPM 96    Minutes 39    METs 2.84             Nutrition:  Target Goals: Understanding of nutrition guidelines, daily intake of sodium '1500mg'$ , cholesterol '200mg'$ , calories 30% from fat and 7% or less from saturated fats, daily to have 5 or more servings of fruits and vegetables.  Biometrics:  Pre Biometrics - 12/16/22 1314       Pre Biometrics   Height 5' 3.5" (1.613 m)    Weight 69.9 kg    Waist Circumference 38 inches    Hip Circumference 40 inches    Waist to Hip Ratio 0.95 %    BMI (Calculated) 26.87    Triceps Skinfold 18 mm    % Body Fat 38 %    Grip Strength 12.6 kg    Flexibility 0 in    Single Leg Stand 0 seconds              Nutrition Therapy Plan and Nutrition Goals:  Nutrition Therapy & Goals - 12/27/22 1026       Personal Nutrition Goals   Comments Patient scored 43 on her diet assessment. We offer 2 educational sessions on heart healthy nutrition with handouts and assistance with RD referral if patient is interested.      Intervention Plan   Intervention Nutrition handout(s) given to patient.    Expected Outcomes Short Term Goal: Understand basic principles of dietary content, such as calories, fat, sodium, cholesterol and nutrients.             Nutrition Assessments:  Nutrition Assessments - 12/16/22 1306       MEDFICTS Scores   Pre Score 43            MEDIFICTS Score Key: ?70  Need to make dietary changes  40-70 Heart Healthy Diet ? 40 Therapeutic Level Cholesterol Diet   Picture Your Plate Scores: [id number]  Unhealthy dietary pattern with much room for improvement. 41-50 Dietary pattern unlikely to meet recommendations for good health and room for improvement. 51-60 More healthful dietary pattern, with some room for improvement.  >60 Healthy dietary pattern, although there may be some specific behaviors that could be improved.    Nutrition Goals Re-Evaluation:   Nutrition Goals Discharge (Final Nutrition Goals Re-Evaluation):   Psychosocial: Target Goals: Acknowledge presence or absence of significant depression and/or stress, maximize coping skills, provide positive support system. Participant is able to verbalize types and ability to use techniques and skills needed for reducing stress and depression.  Initial Review & Psychosocial Screening:  Initial Psych Review & Screening - 12/16/22 1305       Initial Review   Current issues with None Identified      Family Dynamics   Good Support System? Yes    Comments Her 2 children and her friends are her support system.      Barriers   Psychosocial barriers to participate in program There are no identifiable barriers or psychosocial needs.      Screening Interventions   Interventions Encouraged to exercise    Expected Outcomes Long Term goal: The participant improves quality of Life and PHQ9 Scores as seen by post scores and/or verbalization of changes;Short Term goal: Identification and review with participant of any Quality of Life or Depression concerns found by scoring the questionnaire.             Quality of Life Scores:  Quality of Life - 12/16/22 1403       Quality of Life   Select Quality of Life      Quality of Life Scores   Health/Function Pre 21.6 %    Socioeconomic Pre 19.5 %    Psych/Spiritual Pre 24 %    Family Pre 18 %    GLOBAL Pre 21.23 %            Scores of 19 and below usually indicate a poorer quality of life in these areas.  A difference of  2-3 points is a clinically meaningful difference.  A  difference of 2-3 points in the total score of the Quality of Life Index has been associated with significant improvement in overall quality of life, self-image, physical symptoms, and general health in studies assessing change in quality of life.  PHQ-9: Review Flowsheet       12/16/2022  Depression screen PHQ 2/9  Decreased Interest 0  Down, Depressed, Hopeless 0  PHQ - 2 Score 0  Altered sleeping 0  Tired, decreased energy 0  Change in appetite 0  Feeling bad or failure about yourself  0  Trouble concentrating 0  Moving slowly or fidgety/restless 0  Suicidal thoughts 0  PHQ-9 Score 0  Difficult doing work/chores Not difficult at all   Interpretation of Total Score  Total Score Depression Severity:  1-4 = Minimal depression, 5-9 = Mild depression, 10-14 = Moderate depression, 15-19 = Moderately severe depression, 20-27 = Severe depression   Psychosocial Evaluation and Intervention:  Psychosocial Evaluation - 12/16/22 1421       Psychosocial Evaluation & Interventions   Interventions Encouraged to exercise with the program and follow exercise prescription;Relaxation education;Stress management education    Comments Pt has no barriers to participating in CR. She has no identifiable psychosocial issues and scored a 0 on  her PHQ-9. She is very fearful of COVID, and states that she does not get out of the house much anymore due to this fear. She was previously walking a lot at her house, but she has not been doing any walking since her CABG. She reports that she has a good support system with her children and her friends. She has a large group of friends that she keeps very close with. Her goals while in the program are to return to her normal ADL's. She is eager to start the program.    Expected Outcomes Pt will continue to have no identifiable psychosocial issues.    Continue Psychosocial Services  No Follow up required             Psychosocial Re-Evaluation:  Psychosocial  Re-Evaluation     [place] Name 12/27/22 1020 01/24/23 1025           Psychosocial Re-Evaluation   Current issues with None Identified None Identified      Comments Patient is new to the program completing 4 sessions. She continues to have no psychosocial barriers identified. She seems to enjoy the sessions and demonstrates an interest in improving her health. We will continue to monitor her progress. Patient has completed 16 sessions. She continues to have no psychosocial barriers identified. She continues to enjoy the sessions and demonstrates an interest in improving her health. We will continue to monitor her progress.      Expected Outcomes Patient will complete the program meeting both personal and program goals. Patient will complete the program meeting both personal and program goals.      Interventions Stress management education;Encouraged to attend Cardiac Rehabilitation for the exercise;Relaxation education Stress management education;Encouraged to attend Cardiac Rehabilitation for the exercise;Relaxation education      Continue Psychosocial Services  No Follow up required No Follow up required               Psychosocial Discharge (Final Psychosocial Re-Evaluation):  Psychosocial Re-Evaluation - 01/24/23 1025       Psychosocial Re-Evaluation   Current issues with None Identified    Comments Patient has completed 16 sessions. She continues to have no psychosocial barriers identified. She continues to enjoy the sessions and demonstrates an interest in improving her health. We will continue to monitor her progress.    Expected Outcomes Patient will complete the program meeting both personal and program goals.    Interventions Stress management education;Encouraged to attend Cardiac Rehabilitation for the exercise;Relaxation education    Continue Psychosocial Services  No Follow up required             Vocational Rehabilitation: Provide vocational rehab assistance to  qualifying candidates.   Vocational Rehab Evaluation & Intervention:  Vocational Rehab - 12/16/22 1243       Initial Vocational Rehab Evaluation & Intervention   Assessment shows need for Vocational Rehabilitation No      Vocational Rehab Re-Evaulation   Comments She is retired             Education: Education Goals: Education classes will be provided on a weekly basis, covering required topics. Participant will state understanding/return demonstration of topics presented.  Learning Barriers/Preferences:  Learning Barriers/Preferences - 12/16/22 1242       Learning Barriers/Preferences   Learning Barriers None    Learning Preferences Written Material             Education Topics: Hypertension, Hypertension Reduction -Define heart disease and high blood pressure. Discus how high  blood pressure affects the body and ways to reduce high blood pressure.   Exercise and Your Heart -Discuss why it is important to exercise, the FITT principles of exercise, normal and abnormal responses to exercise, and how to exercise safely.   Angina -Discuss definition of angina, causes of angina, treatment of angina, and how to decrease risk of having angina. Flowsheet Row CARDIAC REHAB PHASE II EXERCISE from 01/26/2023 in [place]  Date 12/22/22  Educator DF  Instruction Review Code 1- Verbalizes Understanding       Cardiac Medications -Review what the following cardiac medications are used for, how they affect the body, and side effects that may occur when taking the medications.  Medications include Aspirin, Beta blockers, calcium channel blockers, ACE Inhibitors, angiotensin receptor blockers, diuretics, digoxin, and antihyperlipidemics. Flowsheet Row CARDIAC REHAB PHASE II EXERCISE from 01/26/2023 in [place]  Date 01/05/23  Educator DF  Instruction Review Code 2- Demonstrated Understanding       Congestive Heart  Failure -Discuss the definition of CHF, how to live with CHF, the signs and symptoms of CHF, and how keep track of weight and sodium intake. Flowsheet Row CARDIAC REHAB PHASE II EXERCISE from 01/26/2023 in [place]  Date 12/29/22  Educator DF  Instruction Review Code 2- Demonstrated Understanding       Heart Disease and Intimacy -Discus the effect sexual activity has on the heart, how changes occur during intimacy as we age, and safety during sexual activity. Flowsheet Row CARDIAC REHAB PHASE II EXERCISE from 01/26/2023 in [place]  Date 01/19/23  Educator DF  Instruction Review Code 1- Verbalizes Understanding       Smoking Cessation / COPD -Discuss different methods to quit smoking, the health benefits of quitting smoking, and the definition of COPD. Flowsheet Row CARDIAC REHAB PHASE II EXERCISE from 01/26/2023 in [place]  Date 01/26/23  Educator DF  Instruction Review Code 2- Demonstrated Understanding       Nutrition I: Fats -Discuss the types of cholesterol, what cholesterol does to the heart, and how cholesterol levels can be controlled.   Nutrition II: Labels -Discuss the different components of food labels and how to read food label   Heart Parts/Heart Disease and PAD -Discuss the anatomy of the heart, the pathway of blood circulation through the heart, and these are affected by heart disease.   Stress I: Signs and Symptoms -Discuss the causes of stress, how stress may lead to anxiety and depression, and ways to limit stress.   Stress II: Relaxation -Discuss different types of relaxation techniques to limit stress.   Warning Signs of Stroke / TIA -Discuss definition of a stroke, what the signs and symptoms are of a stroke, and how to identify when someone is having stroke.   Knowledge Questionnaire Score:  Knowledge Questionnaire Score - 12/16/22 1245       Knowledge  Questionnaire Score   Pre Score 10/24             Core Components/Risk Factors/Patient Goals at Admission:  Personal Goals and Risk Factors at Admission - 12/16/22 1316       Core Components/Risk Factors/Patient Goals on Admission   Improve shortness of breath with ADL's Yes    Intervention Provide education, individualized exercise plan and daily activity instruction to help decrease symptoms of SOB with activities of daily living.    Expected Outcomes Short Term: Improve cardiorespiratory fitness to achieve a reduction of  symptoms when performing ADLs;Long Term: Be able to perform more ADLs without symptoms or delay the onset of symptoms    Diabetes Yes    Intervention Provide education about signs/symptoms and action to take for hypo/hyperglycemia.;Provide education about proper nutrition, including hydration, and aerobic/resistive exercise prescription along with prescribed medications to achieve blood glucose in normal ranges: Fasting glucose 65-99 mg/dL    Expected Outcomes Short Term: Participant verbalizes understanding of the signs/symptoms and immediate care of hyper/hypoglycemia, proper foot care and importance of medication, aerobic/resistive exercise and nutrition plan for blood glucose control.;Long Term: Attainment of HbA1C < 7%.    Hypertension Yes    Intervention Provide education on lifestyle modifcations including regular physical activity/exercise, weight management, moderate sodium restriction and increased consumption of fresh fruit, vegetables, and low fat dairy, alcohol moderation, and smoking cessation.;Monitor prescription use compliance.    Expected Outcomes Short Term: Continued assessment and intervention until BP is < 140/45m HG in hypertensive participants. < 130/857mHG in hypertensive participants with diabetes, heart failure or chronic kidney disease.;Long Term: Maintenance of blood pressure at goal levels.    Lipids Yes    Intervention Provide education and  support for participant on nutrition & aerobic/resistive exercise along with prescribed medications to achieve LDL '70mg'$ , HDL >'40mg'$ .    Expected Outcomes Short Term: Participant states understanding of desired cholesterol values and is compliant with medications prescribed. Participant is following exercise prescription and nutrition guidelines.;Long Term: Cholesterol controlled with medications as prescribed, with individualized exercise RX and with personalized nutrition plan. Value goals: LDL < '70mg'$ , HDL > 40 mg.             Core Components/Risk Factors/Patient Goals Review:   Goals and Risk Factor Review     Row Name 12/27/22 1021 01/24/23 1026           Core Components/Risk Factors/Patient Goals Review   Personal Goals Review Improve shortness of breath with ADL's;Diabetes;Lipids;Hypertension;Other Improve shortness of breath with ADL's;Diabetes;Lipids;Hypertension;Other      Review Patient was referred to CR with [facility]. She has multiple risk factors for CAD and is participating in the program for risk modification. She has completed 4 sessions. Her current weight is 154.8 lbs up 0.2 bls from her orientation weight. She is doing well in the program. Her DM is managed with [place] Metformin. Her last A1C on file was 09/16/22 at 7.0%. Her blood pressure is well controlled. Her personal goals for the program are to get stronger; get her heart healthier; and return to her normal ADL's. We will continue to monitor her progress as she works towards meeting these goals. Patient has completed 16 sessions. Her current weight is 152.6 lbs down 2.2 lbs since last 30 day review. She continues to do well in the program with consistent attendance and progressions. She saw Dr. ScGardiner Rhyme/8 for routine follow up. No changes made. She continues to have aortic stenosis with plan to do a TAVR later when the AS progresses to severe. Her DM continues to be managed with Ja[facility]ren Mocha[facility]  Metformin. Her last A1C on file was 09/16/22 at 7.0%. Her blood pressure is at goal. Her personal goals for the program continue to be to get stronger; get her heart healthier; and return to her normal ADL's. We will continue to monitor her progress as she works towards meeting these goals.      Expected Outcomes Patient will complete the program meeting both personal and program goals. Patient will complete the program meeting both  personal and program goals.               Core Components/Risk Factors/Patient Goals at Discharge (Final Review):   Goals and Risk Factor Review - 01/24/23 1026       Core Components/Risk Factors/Patient Goals Review   Personal Goals Review Improve shortness of breath with ADL's;Diabetes;Lipids;Hypertension;Other    Review Patient has completed 16 sessions. Her current weight is 152.6 lbs down 2.2 lbs since last 30 day review. She continues to do well in the program with consistent attendance and progressions. She saw Dr. Gardiner Rhyme 2/8 for routine follow up. No changes made. She continues to have aortic stenosis with plan to do a TAVR later when the AS progresses to severe. Her DM continues to be managed with Sherren Mocha, and Metformin. Her last A1C on file was 09/16/22 at 7.0%. Her blood pressure is at goal. Her personal goals for the program continue to be to get stronger; get her heart healthier; and return to her normal ADL's. We will continue to monitor her progress as she works towards meeting these goals.    Expected Outcomes Patient will complete the program meeting both personal and program goals.             ITP Comments:   Comments: ITP REVIEW Pt is making expected progress toward Cardiac Rehab goals after completing 20 sessions. Recommend continued exercise, life style modification, education, and increased stamina and strength.

## 2023-02-02 NOTE — Progress Notes (Signed)
Daily Session Note  Patient Details  Name: PHILICIA VONSTEIN MRN: [id number] Date of Birth: 08/15/1946 Referring Provider:   Flowsheet Row CARDIAC REHAB PHASE II ORIENTATION from 12/16/2022 in [place]  Referring Provider Dr. Tenny Craw       Encounter Date: 02/02/2023  Check In:  Session Check In - 02/02/23 0922       Check-In   Supervising physician immediately available to respond to emergencies [facility] MD immediately available    Physician(s) Dr Harl Bowie    Location [name]    Staff Present Hoy Register MHA, MS, ACSM-CEP;Leana Roe, BS, Exercise Physiologist;Yeni Jiggetts Hassell Done, RN, BSN    Virtual Visit No    Medication changes reported     No    Fall or balance concerns reported    No    Tobacco Cessation No Change    Warm-up and Cool-down Performed as group-led instruction    Resistance Training Performed Yes    VAD Patient? No    PAD/SET Patient? No      Pain Assessment   Currently in Pain? No/denies    Pain Score 0-No pain    Multiple Pain Sites No             Capillary Blood Glucose: No results found for this or any previous visit (from the past 24 hour(s)).    Social History   Tobacco Use  Smoking Status Never  Smokeless Tobacco Never    Goals Met:  Independence with exercise equipment Exercise tolerated well No report of concerns or symptoms today Strength training completed today  Goals Unmet:  Not Applicable  Comments: Checkout at 1030.   Dr. Carlyle Dolly is Medical Director for [facility] Cardiac Rehab

## 2023-02-04 ENCOUNTER — Encounter (HOSPITAL_COMMUNITY): Payer: Medicare Other

## 2023-02-04 ENCOUNTER — Encounter (HOSPITAL_COMMUNITY)
Admission: RE | Admit: 2023-02-04 | Discharge: 2023-02-04 | Disposition: A | Discharge status: Home / Self Care | Payer: Medicare Other | Source: Ambulatory Visit | Attending: Cardiothoracic Surgery | Admitting: Cardiothoracic Surgery

## 2023-02-04 DIAGNOSIS — L97822 Non-pressure chronic ulcer of other part of left lower leg with fat layer exposed: Secondary | ICD-10-CM | POA: Diagnosis not present

## 2023-02-04 DIAGNOSIS — Z86718 Personal history of other venous thrombosis and embolism: Secondary | ICD-10-CM | POA: Diagnosis not present

## 2023-02-04 DIAGNOSIS — Z951 Presence of aortocoronary bypass graft: Secondary | ICD-10-CM

## 2023-02-04 DIAGNOSIS — I872 Venous insufficiency (chronic) (peripheral): Secondary | ICD-10-CM | POA: Diagnosis not present

## 2023-02-04 DIAGNOSIS — Z7901 Long term (current) use of anticoagulants: Secondary | ICD-10-CM | POA: Diagnosis not present

## 2023-02-04 DIAGNOSIS — J449 Chronic obstructive pulmonary disease, unspecified: Secondary | ICD-10-CM | POA: Diagnosis not present

## 2023-02-04 DIAGNOSIS — I890 Lymphedema, not elsewhere classified: Secondary | ICD-10-CM | POA: Diagnosis not present

## 2023-02-04 DIAGNOSIS — G4733 Obstructive sleep apnea (adult) (pediatric): Secondary | ICD-10-CM | POA: Diagnosis not present

## 2023-02-04 DIAGNOSIS — I10 Essential (primary) hypertension: Secondary | ICD-10-CM | POA: Diagnosis not present

## 2023-02-04 DIAGNOSIS — L89623 Pressure ulcer of left heel, stage 3: Secondary | ICD-10-CM | POA: Diagnosis not present

## 2023-02-04 NOTE — Progress Notes (Signed)
Daily Session Note  Patient Details  Name: Sheila Keller MRN: [id number] Date of Birth: September 07, 1946 Referring Provider:   Flowsheet Row CARDIAC REHAB PHASE II ORIENTATION from 12/16/2022 in [place]  Referring Provider Dr. Tenny Craw       Encounter Date: 02/04/2023  Check In:  Session Check In - 02/04/23 0923       Check-In   Supervising physician immediately available to respond to emergencies [facility] MD immediately available    Physician(s) Dr Harl Bowie    Location [name]    Staff Present Hoy Register MHA, MS, ACSM-CEP;Leana Roe, BS, Exercise Physiologist;Makale Pindell Hassell Done, RN, BSN    Virtual Visit No    Medication changes reported     No    Fall or balance concerns reported    No    Tobacco Cessation No Change    Warm-up and Cool-down Performed as group-led instruction    Resistance Training Performed Yes    VAD Patient? No    PAD/SET Patient? No      Pain Assessment   Currently in Pain? No/denies    Pain Score 0-No pain    Multiple Pain Sites No             Capillary Blood Glucose: No results found for this or any previous visit (from the past 24 hour(s)).    Social History   Tobacco Use  Smoking Status Never  Smokeless Tobacco Never    Goals Met:  Independence with exercise equipment Changing diet to healthy choices, watching portion sizes No report of concerns or symptoms today Strength training completed today  Goals Unmet:  Not Applicable  Comments: Checkout at 1030.   Dr. Carlyle Dolly is Medical Director for [facility] Cardiac Rehab

## 2023-02-07 ENCOUNTER — Encounter (HOSPITAL_COMMUNITY)
Admission: RE | Admit: 2023-02-07 | Discharge: 2023-02-07 | Disposition: A | Discharge status: Home / Self Care | Payer: Medicare Other | Source: Ambulatory Visit | Attending: Cardiothoracic Surgery | Admitting: Cardiothoracic Surgery

## 2023-02-07 ENCOUNTER — Encounter (HOSPITAL_COMMUNITY): Payer: Medicare Other

## 2023-02-07 DIAGNOSIS — J449 Chronic obstructive pulmonary disease, unspecified: Secondary | ICD-10-CM | POA: Diagnosis not present

## 2023-02-07 DIAGNOSIS — Z951 Presence of aortocoronary bypass graft: Secondary | ICD-10-CM

## 2023-02-07 DIAGNOSIS — Z7901 Long term (current) use of anticoagulants: Secondary | ICD-10-CM | POA: Diagnosis not present

## 2023-02-07 DIAGNOSIS — L97822 Non-pressure chronic ulcer of other part of left lower leg with fat layer exposed: Secondary | ICD-10-CM | POA: Diagnosis not present

## 2023-02-07 DIAGNOSIS — L89623 Pressure ulcer of left heel, stage 3: Secondary | ICD-10-CM | POA: Diagnosis not present

## 2023-02-07 DIAGNOSIS — Z86718 Personal history of other venous thrombosis and embolism: Secondary | ICD-10-CM | POA: Diagnosis not present

## 2023-02-07 DIAGNOSIS — I890 Lymphedema, not elsewhere classified: Secondary | ICD-10-CM | POA: Diagnosis not present

## 2023-02-07 DIAGNOSIS — G4733 Obstructive sleep apnea (adult) (pediatric): Secondary | ICD-10-CM | POA: Diagnosis not present

## 2023-02-07 DIAGNOSIS — I872 Venous insufficiency (chronic) (peripheral): Secondary | ICD-10-CM | POA: Diagnosis not present

## 2023-02-07 DIAGNOSIS — I10 Essential (primary) hypertension: Secondary | ICD-10-CM | POA: Diagnosis not present

## 2023-02-07 NOTE — Progress Notes (Signed)
Daily Session Note  Patient Details  Name: Sheila Keller MRN: [id number] Date of Birth: [birth year] Referring Provider:   Flowsheet Row CARDIAC REHAB PHASE II ORIENTATION from 12/16/2022 in [place]  Referring Provider Dr. Tenny Craw       Encounter Date: 02/07/2023  Check In:  Session Check In - 02/07/23 0909       Check-In   Supervising physician immediately available to respond to emergencies [facility] MD immediately available    Physician(s) Dr Harl Bowie    Location [name]    Staff Present Leana Roe, BS, Exercise Physiologist;Ary Rudnick Hassell Done, RN, BSN    Virtual Visit No    Medication changes reported     No    Fall or balance concerns reported    No    Tobacco Cessation Use Increase    Warm-up and Cool-down Performed as group-led instruction    Resistance Training Performed Yes    VAD Patient? No    PAD/SET Patient? No      Pain Assessment   Currently in Pain? No/denies    Pain Score 0-No pain    Multiple Pain Sites No             Capillary Blood Glucose: No results found for this or any previous visit (from the past 24 hour(s)).    Social History   Tobacco Use  Smoking Status Never  Smokeless Tobacco Never    Goals Met:  Independence with exercise equipment Exercise tolerated well No report of concerns or symptoms today Strength training completed today  Goals Unmet:  Not Applicable  Comments: Checkout at 1030.   Dr. Carlyle Dolly is Medical Director for [facility] Cardiac Rehab

## 2023-02-09 ENCOUNTER — Encounter (HOSPITAL_COMMUNITY)
Admission: RE | Admit: 2023-02-09 | Discharge: 2023-02-09 | Disposition: A | Discharge status: Home / Self Care | Payer: Medicare Other | Source: Ambulatory Visit | Attending: Cardiothoracic Surgery | Admitting: Cardiothoracic Surgery

## 2023-02-09 ENCOUNTER — Encounter (HOSPITAL_COMMUNITY): Payer: Medicare Other

## 2023-02-09 DIAGNOSIS — I10 Essential (primary) hypertension: Secondary | ICD-10-CM | POA: Diagnosis not present

## 2023-02-09 DIAGNOSIS — Z7901 Long term (current) use of anticoagulants: Secondary | ICD-10-CM | POA: Diagnosis not present

## 2023-02-09 DIAGNOSIS — Z86718 Personal history of other venous thrombosis and embolism: Secondary | ICD-10-CM | POA: Diagnosis not present

## 2023-02-09 DIAGNOSIS — L97822 Non-pressure chronic ulcer of other part of left lower leg with fat layer exposed: Secondary | ICD-10-CM | POA: Diagnosis not present

## 2023-02-09 DIAGNOSIS — Z951 Presence of aortocoronary bypass graft: Secondary | ICD-10-CM | POA: Diagnosis not present

## 2023-02-09 DIAGNOSIS — J449 Chronic obstructive pulmonary disease, unspecified: Secondary | ICD-10-CM | POA: Diagnosis not present

## 2023-02-09 DIAGNOSIS — I890 Lymphedema, not elsewhere classified: Secondary | ICD-10-CM | POA: Diagnosis not present

## 2023-02-09 DIAGNOSIS — L89623 Pressure ulcer of left heel, stage 3: Secondary | ICD-10-CM | POA: Diagnosis not present

## 2023-02-09 DIAGNOSIS — I872 Venous insufficiency (chronic) (peripheral): Secondary | ICD-10-CM | POA: Diagnosis not present

## 2023-02-09 DIAGNOSIS — G4733 Obstructive sleep apnea (adult) (pediatric): Secondary | ICD-10-CM | POA: Diagnosis not present

## 2023-02-09 NOTE — Progress Notes (Signed)
Daily Session Note  Patient Details  Name: Sheila Keller MRN: [id number] Date of Birth: 1946/11/29 Referring Provider:   Flowsheet Row CARDIAC REHAB PHASE II ORIENTATION from 12/16/2022 in [place]  Referring Provider Dr. Tenny Craw       Encounter Date: 02/09/2023  Check In:  Session Check In - 02/09/23 0930       Check-In   Supervising physician immediately available to respond to emergencies [facility] MD immediately available    Physician(s) Dr Harl Bowie    Location [name]    Staff Present Hoy Register MHA, MS, ACSM-CEP;Leana Roe, BS, Exercise Physiologist;Hillary Troutman BSN, RN    Virtual Visit No    Medication changes reported     No    Fall or balance concerns reported    No    Tobacco Cessation No Change    Warm-up and Cool-down Performed as group-led instruction    Resistance Training Performed Yes    VAD Patient? No    PAD/SET Patient? No      Pain Assessment   Currently in Pain? No/denies    Pain Score 0-No pain    Multiple Pain Sites No             Capillary Blood Glucose: No results found for this or any previous visit (from the past 24 hour(s)).    Social History   Tobacco Use  Smoking Status Never  Smokeless Tobacco Never    Goals Met:  Independence with exercise equipment Exercise tolerated well No report of concerns or symptoms today Strength training completed today  Goals Unmet:  Not Applicable  Comments: checkout time is 1030   Dr. Carlyle Dolly is Medical Director for [facility]

## 2023-02-11 ENCOUNTER — Encounter (HOSPITAL_COMMUNITY)
Admission: RE | Admit: 2023-02-11 | Discharge: 2023-02-11 | Disposition: A | Discharge status: Home / Self Care | Payer: Medicare Other | Source: Ambulatory Visit | Attending: Cardiothoracic Surgery | Admitting: Cardiothoracic Surgery

## 2023-02-11 ENCOUNTER — Encounter (HOSPITAL_COMMUNITY): Payer: Medicare Other

## 2023-02-11 DIAGNOSIS — Z7901 Long term (current) use of anticoagulants: Secondary | ICD-10-CM | POA: Diagnosis not present

## 2023-02-11 DIAGNOSIS — I872 Venous insufficiency (chronic) (peripheral): Secondary | ICD-10-CM | POA: Diagnosis not present

## 2023-02-11 DIAGNOSIS — L89623 Pressure ulcer of left heel, stage 3: Secondary | ICD-10-CM | POA: Diagnosis not present

## 2023-02-11 DIAGNOSIS — J449 Chronic obstructive pulmonary disease, unspecified: Secondary | ICD-10-CM | POA: Diagnosis not present

## 2023-02-11 DIAGNOSIS — Z951 Presence of aortocoronary bypass graft: Secondary | ICD-10-CM

## 2023-02-11 DIAGNOSIS — I890 Lymphedema, not elsewhere classified: Secondary | ICD-10-CM | POA: Diagnosis not present

## 2023-02-11 DIAGNOSIS — I10 Essential (primary) hypertension: Secondary | ICD-10-CM | POA: Diagnosis not present

## 2023-02-11 DIAGNOSIS — Z86718 Personal history of other venous thrombosis and embolism: Secondary | ICD-10-CM | POA: Diagnosis not present

## 2023-02-11 DIAGNOSIS — G4733 Obstructive sleep apnea (adult) (pediatric): Secondary | ICD-10-CM | POA: Diagnosis not present

## 2023-02-11 DIAGNOSIS — L97822 Non-pressure chronic ulcer of other part of left lower leg with fat layer exposed: Secondary | ICD-10-CM | POA: Diagnosis not present

## 2023-02-11 NOTE — Progress Notes (Signed)
Daily Session Note  Patient Details  Name: Sheila Keller MRN: [id number] Date of Birth: [birth year] Referring Provider:   Flowsheet Row CARDIAC REHAB PHASE II ORIENTATION from 12/16/2022 in [place]  Referring Provider Dr. Tenny Craw       Encounter Date: 02/11/2023  Check In:  Session Check In - 02/11/23 0924       Check-In   Supervising physician immediately available to respond to emergencies [facility] MD immediately available    Physician(s) Dr Dellia Cloud    Location [name]    Staff Present Leana Roe, BS, Exercise Physiologist;Debra Wynetta Emery, RN, Joanette Gula, RN, BSN    Virtual Visit No    Medication changes reported     No    Fall or balance concerns reported    No    Tobacco Cessation No Change    Warm-up and Cool-down Performed as group-led instruction    Resistance Training Performed Yes    VAD Patient? No    PAD/SET Patient? No      Pain Assessment   Currently in Pain? No/denies    Pain Score 0-No pain    Multiple Pain Sites No             Capillary Blood Glucose: No results found for this or any previous visit (from the past 24 hour(s)).    Social History   Tobacco Use  Smoking Status Never  Smokeless Tobacco Never    Goals Met:  Independence with exercise equipment Exercise tolerated well No report of concerns or symptoms today Strength training completed today  Goals Unmet:  Not Applicable  Comments: Checkout at 1030.   Dr. Carlyle Dolly is Medical Director for [facility] Cardiac Rehab

## 2023-02-14 ENCOUNTER — Encounter (HOSPITAL_COMMUNITY): Payer: Medicare Other

## 2023-02-14 ENCOUNTER — Encounter (HOSPITAL_COMMUNITY)
Admission: RE | Admit: 2023-02-14 | Discharge: 2023-02-14 | Disposition: A | Discharge status: Home / Self Care | Payer: Medicare Other | Source: Ambulatory Visit | Attending: Cardiothoracic Surgery | Admitting: Cardiothoracic Surgery

## 2023-02-14 VITALS — Wt 159.0 lb

## 2023-02-14 DIAGNOSIS — G4733 Obstructive sleep apnea (adult) (pediatric): Secondary | ICD-10-CM | POA: Diagnosis not present

## 2023-02-14 DIAGNOSIS — Z951 Presence of aortocoronary bypass graft: Secondary | ICD-10-CM | POA: Diagnosis not present

## 2023-02-14 DIAGNOSIS — L97822 Non-pressure chronic ulcer of other part of left lower leg with fat layer exposed: Secondary | ICD-10-CM | POA: Diagnosis not present

## 2023-02-14 DIAGNOSIS — H6123 Impacted cerumen, bilateral: Secondary | ICD-10-CM | POA: Diagnosis not present

## 2023-02-14 DIAGNOSIS — Z7901 Long term (current) use of anticoagulants: Secondary | ICD-10-CM | POA: Diagnosis not present

## 2023-02-14 DIAGNOSIS — Z86718 Personal history of other venous thrombosis and embolism: Secondary | ICD-10-CM | POA: Diagnosis not present

## 2023-02-14 DIAGNOSIS — J449 Chronic obstructive pulmonary disease, unspecified: Secondary | ICD-10-CM | POA: Diagnosis not present

## 2023-02-14 DIAGNOSIS — L89623 Pressure ulcer of left heel, stage 3: Secondary | ICD-10-CM | POA: Diagnosis not present

## 2023-02-14 DIAGNOSIS — I890 Lymphedema, not elsewhere classified: Secondary | ICD-10-CM | POA: Diagnosis not present

## 2023-02-14 DIAGNOSIS — I872 Venous insufficiency (chronic) (peripheral): Secondary | ICD-10-CM | POA: Diagnosis not present

## 2023-02-14 DIAGNOSIS — I10 Essential (primary) hypertension: Secondary | ICD-10-CM | POA: Diagnosis not present

## 2023-02-14 NOTE — Progress Notes (Signed)
Daily Session Note  Patient Details  Name: Sheila Keller MRN: [id number] Date of Birth: [birth year] Referring Provider:   Flowsheet Row CARDIAC REHAB PHASE II ORIENTATION from 12/16/2022 in [place]  Referring Provider Dr. Tenny Craw       Encounter Date: 02/14/2023  Check In:  Session Check In - 02/14/23 0926       Check-In   Location [name]    Staff Present Leana Roe, BS, Exercise Physiologist;Mia Winthrop Hassell Done, RN, BSN;Dalton Fletcher MHA, MS, ACSM-CEP    Virtual Visit No    Medication changes reported     No    Fall or balance concerns reported    No    Tobacco Cessation No Change    Warm-up and Cool-down Performed as group-led instruction    Resistance Training Performed Yes    VAD Patient? No    PAD/SET Patient? No      Pain Assessment   Currently in Pain? No/denies    Pain Score 0-No pain    Multiple Pain Sites No             Capillary Blood Glucose: No results found for this or any previous visit (from the past 24 hour(s)).    Social History   Tobacco Use  Smoking Status Never  Smokeless Tobacco Never    Goals Met:  Independence with exercise equipment Exercise tolerated well No report of concerns or symptoms today Strength training completed today  Goals Unmet:  Not Applicable  Comments: Checkout at 1030.   Dr. Carlyle Dolly is Medical Director for [facility] Cardiac Rehab

## 2023-02-16 ENCOUNTER — Encounter (HOSPITAL_COMMUNITY)
Admission: RE | Admit: 2023-02-16 | Discharge: 2023-02-16 | Disposition: A | Discharge status: Home / Self Care | Payer: Medicare Other | Source: Ambulatory Visit | Attending: Cardiothoracic Surgery | Admitting: Cardiothoracic Surgery

## 2023-02-16 ENCOUNTER — Encounter (HOSPITAL_COMMUNITY): Payer: Medicare Other

## 2023-02-16 DIAGNOSIS — I872 Venous insufficiency (chronic) (peripheral): Secondary | ICD-10-CM | POA: Diagnosis not present

## 2023-02-16 DIAGNOSIS — L97822 Non-pressure chronic ulcer of other part of left lower leg with fat layer exposed: Secondary | ICD-10-CM | POA: Diagnosis not present

## 2023-02-16 DIAGNOSIS — L89623 Pressure ulcer of left heel, stage 3: Secondary | ICD-10-CM | POA: Diagnosis not present

## 2023-02-16 DIAGNOSIS — Z951 Presence of aortocoronary bypass graft: Secondary | ICD-10-CM

## 2023-02-16 DIAGNOSIS — G4733 Obstructive sleep apnea (adult) (pediatric): Secondary | ICD-10-CM | POA: Diagnosis not present

## 2023-02-16 DIAGNOSIS — Z86718 Personal history of other venous thrombosis and embolism: Secondary | ICD-10-CM | POA: Diagnosis not present

## 2023-02-16 DIAGNOSIS — J449 Chronic obstructive pulmonary disease, unspecified: Secondary | ICD-10-CM | POA: Diagnosis not present

## 2023-02-16 DIAGNOSIS — I10 Essential (primary) hypertension: Secondary | ICD-10-CM | POA: Diagnosis not present

## 2023-02-16 DIAGNOSIS — Z7901 Long term (current) use of anticoagulants: Secondary | ICD-10-CM | POA: Diagnosis not present

## 2023-02-16 DIAGNOSIS — I890 Lymphedema, not elsewhere classified: Secondary | ICD-10-CM | POA: Diagnosis not present

## 2023-02-16 NOTE — Progress Notes (Signed)
Daily Session Note  Patient Details  Name: DORRIS RIEHLE MRN: [id number] Date of Birth: 1946/06/02 Referring Provider:   Flowsheet Row CARDIAC REHAB PHASE II ORIENTATION from 12/16/2022 in [place]  Referring Provider Dr. Tenny Craw       Encounter Date: 02/16/2023  Check In:  Session Check In - 02/16/23 0925       Check-In   Supervising physician immediately available to respond to emergencies [facility] MD immediately available    Physician(s) Dr Dellia Cloud    Location [name]    Staff Present Hoy Register MHA, MS, ACSM-CEP;[employer] BSN, RN;Heather Mel [place], [place], Exercise Physiologist    Virtual Visit No    Medication changes reported     No    Fall or balance concerns reported    No    Tobacco Cessation No Change    Warm-up and Cool-down Performed as group-led instruction    Resistance Training Performed Yes    VAD Patient? No    PAD/SET Patient? No      Pain Assessment   Currently in Pain? No/denies    Pain Score 0-No pain    Multiple Pain Sites No             Capillary Blood Glucose: No results found for this or any previous visit (from the past 24 hour(s)).    Social History   Tobacco Use  Smoking Status Never  Smokeless Tobacco Never    Goals Met:  Independence with exercise equipment Exercise tolerated well No report of concerns or symptoms today Strength training completed today  Goals Unmet:  Not Applicable  Comments: checkout time is 1030   Dr. Carlyle Dolly is Medical Director for [facility]

## 2023-02-16 NOTE — Progress Notes (Deleted)
77 y.o. G35P2002 Divorced Caucasian female here for annual exam.    PCP:     No LMP recorded (lmp unknown). Patient is postmenopausal.           Sexually active: {yes no:314532}  The current method of family planning is post menopausal status.    Exercising: {yes no:314532}  {types:19826} Smoker:  no  Health Maintenance: Pap:  02/25/22 neg: HR HPV neg, 02/14/20 neg History of abnormal Pap:  {YES NO:22349} MMG:  09/09/21 Breast Density category B, BI-RADS CATEGORY 1 neg Colonoscopy:  12//15/11 BMD:   03/31/22  Result  low bone mass TDaP:  *** Gardasil:   {YES NO:22349} HIV: Hep C: Screening Labs:  Hb today: ***, Urine today: ***   reports that she has never smoked. She has never used smokeless tobacco. She reports that she does not drink alcohol and does not use drugs.  Past Medical History:  Diagnosis Date   Anxiety    Breast lump 08/2017   no cancer/ [facility]   Chronic cystitis    Diabetes mellitus    type 2   Elevated cholesterol    Heart murmur    Aortic valve stenosis.  Sees cardiologist with [facility], no current problems per patient   Hypertension    Mild dysplasia of cervix (CIN I)    Osteopenia 02/2018   T score -1.8 FRAX 11% / 2.2%    Past Surgical History:  Procedure Laterality Date   BREAST BIOPSY Left    CATARACT EXTRACTION Bilateral    x2    CERVICAL BIOPSY  W/ LOOP ELECTRODE EXCISION  2009   CHOLECYSTECTOMY     colonoscopy     COLPOSCOPY     CORONARY ARTERY BYPASS GRAFT N/A 09/20/2022   Procedure: CORONARY ARTERY BYPASS GRAFTING (CABG) X3 USING ENDOSCOPICALLY HARVESTED RIGHT GREATER SAPHENOUS VEIN;  Surgeon: Neomia Glass, MD;  Location: [place];  Service: Open Heart Surgery;  Laterality: N/A;   DILATION AND CURETTAGE OF UTERUS     IR THORACENTESIS ASP PLEURAL SPACE W/IMG GUIDE  10/08/2022   LEFT HEART CATH AND CORONARY ANGIOGRAPHY N/A 09/01/2022   Procedure: LEFT HEART CATH AND CORONARY ANGIOGRAPHY;  Surgeon: Belva Crome, MD;   Location: [place] [facility];  Service: Cardiovascular;  Laterality: N/A;   TEE WITHOUT CARDIOVERSION N/A 09/20/2022   Procedure: TRANSESOPHAGEAL ECHOCARDIOGRAM (TEE);  Surgeon: Neomia Glass, MD;  Location: [place];  Service: Open Heart Surgery;  Laterality: N/A;   TUBAL LIGATION     vocal cord surgery     nodules excised    Current Outpatient Medications  Medication Sig Dispense Refill   aspirin EC 325 MG tablet Take 1 tablet (325 mg total) by mouth daily. 30 tablet 2   atorvastatin (LIPITOR) 80 MG tablet Take 1 tablet (80 mg total) by mouth daily. 90 tablet 3   Cholecalciferol (VITAMIN D3) 2000 units TABS Take 2,000 Units by mouth every morning.     docusate sodium (COLACE) 100 MG capsule Take 100 mg by mouth daily. (Patient not taking: Reported on 01/13/2023)     empagliflozin (JARDIANCE) 25 MG TABS tablet Take 25 mg by mouth daily.     furosemide (LASIX) 20 MG tablet Take 0.5 tablets (10 mg total) by mouth daily. Take 1/2 tablet of the 20 mg per day (10 mg po x7 days) (Patient not taking: Reported on 12/15/2022) 7 tablet 0   metFORMIN (GLUCOPHAGE) 1000 MG tablet Take 1,000 mg by mouth 2 (two) times daily. (  Patient not taking: Reported on 01/14/2023)     metFORMIN (GLUCOPHAGE) 500 MG tablet Take 1,000 mg by mouth 2 (two) times daily with a meal.  1   metoprolol tartrate (LOPRESSOR) 25 MG tablet Take 1 tablet (25 mg total) by mouth 2 (two) times daily. 90 tablet 2   risedronate (ACTONEL) 150 MG tablet PLEASE SEE ATTACHED FOR DETAILED DIRECTIONS (Patient not taking: Reported on 01/13/2023) 3 tablet 1   sitaGLIPtin (JANUVIA) 100 MG tablet Take 100 mg by mouth daily.     No current facility-administered medications for this visit.    Family History  Problem Relation Age of Onset   Diabetes Mother    Hypertension Mother    Heart disease Mother    Stroke Mother    Breast cancer Mother        Age 28   Lung cancer Father    Breast cancer Maternal Aunt        Age 53's   Diabetes Son    Heart  attack Son     Review of Systems  Exam:   LMP  (LMP Unknown)     General appearance: alert, cooperative and appears stated age Head: normocephalic, without obvious abnormality, atraumatic Neck: no adenopathy, supple, symmetrical, trachea midline and thyroid normal to inspection and palpation Lungs: clear to auscultation bilaterally Breasts: normal appearance, no masses or tenderness, No nipple retraction or dimpling, No nipple discharge or bleeding, No axillary adenopathy Heart: regular rate and rhythm Abdomen: soft, non-tender; no masses, no organomegaly Extremities: extremities normal, atraumatic, no cyanosis or edema Skin: skin color, texture, turgor normal. No rashes or lesions Lymph nodes: cervical, supraclavicular, and axillary nodes normal. Neurologic: grossly normal  Pelvic: External genitalia:  no lesions              No abnormal inguinal nodes palpated.              Urethra:  normal appearing urethra with no masses, tenderness or lesions              Bartholins and Skenes: normal                 Vagina: normal appearing vagina with normal color and discharge, no lesions              Cervix: no lesions              Pap taken: {yes no:314532} Bimanual Exam:  Uterus:  normal size, contour, position, consistency, mobility, non-tender              Adnexa: no mass, fullness, tenderness              Rectal exam: {yes no:314532}.  Confirms.              Anus:  normal sphincter tone, no lesions  Chaperone was present for exam:  ***  Assessment:   Well woman visit with gynecologic exam.   Plan: Mammogram screening discussed. Self breast awareness reviewed. Pap and HR HPV as above. Guidelines for Calcium, Vitamin D, regular exercise program including cardiovascular and weight bearing exercise.   Follow up annually and prn.   Additional counseling given.  {yes [id number]. _______ minutes face to face time of which over 50% was spent in counseling.    After visit summary  provided.

## 2023-02-17 ENCOUNTER — Encounter: Payer: Medicare Other | Attending: Family Medicine | Admitting: Nutrition

## 2023-02-17 ENCOUNTER — Encounter: Payer: Self-pay | Admitting: Nutrition

## 2023-02-17 VITALS — Ht 63.5 in | Wt 151.0 lb

## 2023-02-17 DIAGNOSIS — Z713 Dietary counseling and surveillance: Secondary | ICD-10-CM | POA: Diagnosis not present

## 2023-02-17 DIAGNOSIS — Z951 Presence of aortocoronary bypass graft: Secondary | ICD-10-CM | POA: Insufficient documentation

## 2023-02-17 DIAGNOSIS — I2510 Atherosclerotic heart disease of native coronary artery without angina pectoris: Secondary | ICD-10-CM | POA: Diagnosis not present

## 2023-02-17 DIAGNOSIS — I10 Essential (primary) hypertension: Secondary | ICD-10-CM | POA: Insufficient documentation

## 2023-02-17 DIAGNOSIS — E118 Type 2 diabetes mellitus with unspecified complications: Secondary | ICD-10-CM | POA: Insufficient documentation

## 2023-02-17 NOTE — Progress Notes (Signed)
Medical Nutrition Therapy  Appointment Start time:  0800  Appointment End time:  0915  Primary concerns today: Dm Type 2, Heart disease , s/[ CABG x 3 Referral diagnosis: E11.8, I51.9 Preferred learning style: No preference  Learning readiness:   Ready    NUTRITION ASSESSMENT  77 yr old wfemale here for Type 2 DM and s/p CABG x 3, CAD and HTN. Had CABG in October of 2023. Has had DM Type 2 X 10 years. Is currently in Cardiac Rehab. Cardiologist is Dr. Gardiner Rhyme with [facility] in [place].  Currently taking Metformin, Jardiance and Januvia. BS at CR have been 110-150's usually. Doesn't check her BS very often at home. BS at home 120-130's.  Lab Results  Component Value Date   HGBA1C 7.0 (H) 09/16/2022   Is scheduled to see a new PCP, Dr. Nancy Fetter soon to recheck her A1C and other labs.  She admits she enjoys her sweet candy and ginger ale at times. Eats out some. Wt has been stable. Walks some outside of cardiac rehab.  She is willing to work on lifestyle medicine to help improve her DM and reduce complications from her chronic conditions.   Anthropometrics  Wt Readings from Last 3 Encounters:  02/17/23 151 lb (68.5 kg)  02/14/23 158 lb 15.2 oz (72.1 kg)  01/17/23 152 lb 8.9 oz (69.2 kg)   Ht Readings from Last 3 Encounters:  02/17/23 5' 3.5" (1.613 m)  01/13/23 5' 3.5" (1.613 m)  12/16/22 5' 3.5" (1.613 m)   Body mass index is 26.33 kg/m. '@BMIFA'$ @ Facility age limit for growth %iles is 20 years. Facility age limit for growth %iles is 20 years.    Clinical Medical Hx: CAD, Type 2 DM, HTN, CABG x 3 Medications: Jardiance, Metformin and Januvia. Labs:  Lab Results  Component Value Date   HGBA1C 7.0 (H) 09/16/2022      Latest Ref Rng & Units 10/13/2022   12:00 PM 09/26/2022    9:59 AM 09/24/2022    1:02 AM  CMP  Glucose 70 - 99 mg/dL 100  142  149   BUN 8 - 27 mg/dL '19  18  11   '$ Creatinine 0.57 - 1.00 mg/dL 0.84  0.89  0.69   Sodium 134 - 144 mmol/L 144  139  139    Potassium 3.5 - 5.2 mmol/L 5.2  3.6  3.9   Chloride 96 - 106 mmol/L 106  110  113   CO2 20 - 29 mmol/L '20  20  20   '$ Calcium 8.7 - 10.3 mg/dL 9.7  8.5  7.9   Total Protein 6.0 - 8.5 g/dL 6.6     Total Bilirubin 0.0 - 1.2 mg/dL 0.4     Alkaline Phos 44 - 121 IU/L 80     AST 0 - 40 IU/L 21     ALT 0 - 32 IU/L 10     Lipid Panel     Component Value Date/Time   CHOL 110 10/13/2022 1201   TRIG 145 10/13/2022 1201   HDL 43 10/13/2022 1201   CHOLHDL 2.6 10/13/2022 1201   LDLCALC 42 10/13/2022 1201   LABVLDL 25 10/13/2022 1201    Notable Signs/Symptoms: no symtoms  Lifestyle & Dietary Hx LIves by herseft  Estimated daily fluid intake: 16 oz of water Supplements:  Sleep: giid Stress / self-care:  Current average weekly physical activity: CR and walks some  24-Hr Dietary Recall First Meal: 1 Slice toast white with butter, coffee, or this am; biscuit  and candy. Snack: some bunny candy. Second Meal: Baked potato-butter, pepper, slaw and hushpuppies, milk,  Snack: candy Third Meal: same as lunch Snack: piece of candy Beverages: water 1- bottle, 1 -oz soda gingerale   Estimated Energy Needs Calories: 1200 Carbohydrate: 135g Protein: 90g Fat: 33g   NUTRITION DIAGNOSIS  NB-1.1 Food and nutrition-related knowledge deficit As related to Diabetes Type 2.  As evidenced by A1C 7.0%.   NUTRITION INTERVENTION  Nutrition education (E-1) on the following topics:  Nutrition and Diabetes education provided on My Plate, CHO counting, meal planning, portion sizes, timing of meals, avoiding snacks between meals unless having a low blood sugar, target ranges for A1C and blood sugars, signs/symptoms and treatment of hyper/hypoglycemia, monitoring blood sugars, taking medications as prescribed, benefits of exercising 30 minutes per day and prevention of complications of DM.  Lifestyle Medicine  - Whole Food, Plant Predominant Nutrition is highly recommended: Eat Plenty of vegetables,  Mushrooms, fruits, Legumes, Whole Grains, Nuts, seeds in lieu of processed meats, processed snacks/pastries red meat, poultry, eggs.    -It is better to avoid simple carbohydrates including: Cakes, Sweet Desserts, Ice Cream, Soda (diet and regular), Sweet Tea, Candies, Chips, Cookies, Store Bought Juices, Alcohol in Excess of  1-2 drinks a day, Lemonade,  Artificial Sweeteners, Doughnuts, Coffee Creamers, "Sugar-free" Products, etc, etc.  This is not a complete list.....  Exercise: If you are able: 30 -60 minutes a day ,4 days a week, or 150 minutes a week.  The longer the better.  Combine stretch, strength, and aerobic activities.  If you were told in the past that you have high risk for cardiovascular diseases, you may seek evaluation by your heart doctor prior to initiating moderate to intense exercise programs.   Handouts Provided Include  Lifestyle Medicine   Learning Style & Readiness for Change Teaching method utilized: Visual & Auditory  Demonstrated degree of understanding via: Teach Back  Barriers to learning/adherence to lifestyle change: none  Goals Established by Pt Goals Cut out sweets, candy and sodas Drink 3 bottles of water per day Increase fresh fruits, vegeatables and whole grains -- eat foods from a garden. :) Get A1C to 5.7% or below. Increase high fiber foods Avoid fast food and processed foods.   MONITORING & EVALUATION Dietary intake, weekly physical activity, and blood sugars in 1 month.  Next Steps  Patient is to work on following whole plant based lifestyle .Marland Kitchen

## 2023-02-17 NOTE — Patient Instructions (Signed)
Goals Cut out sweets, candy and sodas Drink 3 bottles of water per day Increase fresh fruits, vegeatables and whole grains -- eat foods from a garden. :) Get A1C to 5.7% or below. Increase high fiber foods Avoid fast food and processed foods.

## 2023-02-18 ENCOUNTER — Encounter (HOSPITAL_COMMUNITY): Payer: Medicare Other

## 2023-02-18 ENCOUNTER — Encounter (HOSPITAL_COMMUNITY)
Admission: RE | Admit: 2023-02-18 | Discharge: 2023-02-18 | Disposition: A | Discharge status: Home / Self Care | Payer: Medicare Other | Source: Ambulatory Visit | Attending: Cardiothoracic Surgery | Admitting: Cardiothoracic Surgery

## 2023-02-18 DIAGNOSIS — Z7901 Long term (current) use of anticoagulants: Secondary | ICD-10-CM | POA: Diagnosis not present

## 2023-02-18 DIAGNOSIS — L89623 Pressure ulcer of left heel, stage 3: Secondary | ICD-10-CM | POA: Diagnosis not present

## 2023-02-18 DIAGNOSIS — G4733 Obstructive sleep apnea (adult) (pediatric): Secondary | ICD-10-CM | POA: Diagnosis not present

## 2023-02-18 DIAGNOSIS — I350 Nonrheumatic aortic (valve) stenosis: Secondary | ICD-10-CM | POA: Diagnosis not present

## 2023-02-18 DIAGNOSIS — L97822 Non-pressure chronic ulcer of other part of left lower leg with fat layer exposed: Secondary | ICD-10-CM | POA: Diagnosis not present

## 2023-02-18 DIAGNOSIS — I10 Essential (primary) hypertension: Secondary | ICD-10-CM | POA: Diagnosis not present

## 2023-02-18 DIAGNOSIS — I25119 Atherosclerotic heart disease of native coronary artery with unspecified angina pectoris: Secondary | ICD-10-CM | POA: Diagnosis not present

## 2023-02-18 DIAGNOSIS — Z951 Presence of aortocoronary bypass graft: Secondary | ICD-10-CM

## 2023-02-18 DIAGNOSIS — J449 Chronic obstructive pulmonary disease, unspecified: Secondary | ICD-10-CM | POA: Diagnosis not present

## 2023-02-18 DIAGNOSIS — I872 Venous insufficiency (chronic) (peripheral): Secondary | ICD-10-CM | POA: Diagnosis not present

## 2023-02-18 DIAGNOSIS — I890 Lymphedema, not elsewhere classified: Secondary | ICD-10-CM | POA: Diagnosis not present

## 2023-02-18 DIAGNOSIS — E7800 Pure hypercholesterolemia, unspecified: Secondary | ICD-10-CM | POA: Diagnosis not present

## 2023-02-18 DIAGNOSIS — Z86718 Personal history of other venous thrombosis and embolism: Secondary | ICD-10-CM | POA: Diagnosis not present

## 2023-02-18 NOTE — Progress Notes (Signed)
Daily Session Note  Patient Details  Name: Sheila Keller MRN: [id number] Date of Birth: [birth year] Referring Provider:   Flowsheet Row CARDIAC REHAB PHASE II ORIENTATION from 12/16/2022 in [place]  Referring Provider Dr. Tenny Craw       Encounter Date: 02/18/2023  Check In:  Session Check In - 02/18/23 0927       Check-In   Supervising physician immediately available to respond to emergencies [facility] MD immediately available    Physician(s) Dr Dellia Cloud    Location [name]    Staff Present Hoy Register MHA, MS, ACSM-CEP;Leana Roe, BS, Exercise Physiologist;Mychael Soots Wynetta Emery, RN, BSN    Virtual Visit No    Medication changes reported     No    Fall or balance concerns reported    No    Tobacco Cessation No Change    Warm-up and Cool-down Performed as group-led instruction    Resistance Training Performed Yes    VAD Patient? No    PAD/SET Patient? No      Pain Assessment   Currently in Pain? No/denies    Pain Score 0-No pain    Multiple Pain Sites No             Capillary Blood Glucose: No results found for this or any previous visit (from the past 24 hour(s)).    Social History   Tobacco Use  Smoking Status Never  Smokeless Tobacco Never    Goals Met:  Independence with exercise equipment Exercise tolerated well No report of concerns or symptoms today Strength training completed today  Goals Unmet:  Not Applicable  Comments: Check out 1030.   Dr. Carlyle Dolly is Medical Director for [facility]

## 2023-02-21 ENCOUNTER — Encounter (HOSPITAL_COMMUNITY): Payer: Medicare Other

## 2023-02-21 ENCOUNTER — Encounter (HOSPITAL_COMMUNITY)
Admission: RE | Admit: 2023-02-21 | Discharge: 2023-02-21 | Disposition: A | Discharge status: Home / Self Care | Payer: Medicare Other | Source: Ambulatory Visit | Attending: Cardiothoracic Surgery | Admitting: Cardiothoracic Surgery

## 2023-02-21 DIAGNOSIS — G4733 Obstructive sleep apnea (adult) (pediatric): Secondary | ICD-10-CM | POA: Diagnosis not present

## 2023-02-21 DIAGNOSIS — I890 Lymphedema, not elsewhere classified: Secondary | ICD-10-CM | POA: Diagnosis not present

## 2023-02-21 DIAGNOSIS — I10 Essential (primary) hypertension: Secondary | ICD-10-CM | POA: Diagnosis not present

## 2023-02-21 DIAGNOSIS — I872 Venous insufficiency (chronic) (peripheral): Secondary | ICD-10-CM | POA: Diagnosis not present

## 2023-02-21 DIAGNOSIS — L97822 Non-pressure chronic ulcer of other part of left lower leg with fat layer exposed: Secondary | ICD-10-CM | POA: Diagnosis not present

## 2023-02-21 DIAGNOSIS — Z951 Presence of aortocoronary bypass graft: Secondary | ICD-10-CM

## 2023-02-21 DIAGNOSIS — J449 Chronic obstructive pulmonary disease, unspecified: Secondary | ICD-10-CM | POA: Diagnosis not present

## 2023-02-21 DIAGNOSIS — Z7901 Long term (current) use of anticoagulants: Secondary | ICD-10-CM | POA: Diagnosis not present

## 2023-02-21 DIAGNOSIS — L89623 Pressure ulcer of left heel, stage 3: Secondary | ICD-10-CM | POA: Diagnosis not present

## 2023-02-21 DIAGNOSIS — Z86718 Personal history of other venous thrombosis and embolism: Secondary | ICD-10-CM | POA: Diagnosis not present

## 2023-02-21 NOTE — Progress Notes (Signed)
Daily Session Note  Patient Details  Name: Sheila Keller MRN: [id number] Date of Birth: 10-11-1946 Referring Provider:   Flowsheet Row CARDIAC REHAB PHASE II ORIENTATION from 12/16/2022 in [place]  Referring Provider Dr. Tenny Craw       Encounter Date: 02/21/2023  Check In:  Session Check In - 02/21/23 0926       Check-In   Supervising physician immediately available to respond to emergencies [facility] MD immediately available    Physician(s) Dr Dellia Cloud    Location [name]    Staff Present Hoy Register MHA, MS, ACSM-CEP;Leana Roe, BS, Exercise Physiologist;Jomes Giraldo Hassell Done, RN, BSN    Virtual Visit No    Medication changes reported     No    Fall or balance concerns reported    No    Tobacco Cessation No Change    Warm-up and Cool-down Performed as group-led instruction    Resistance Training Performed Yes    VAD Patient? No    PAD/SET Patient? No      Pain Assessment   Currently in Pain? No/denies    Pain Score 0-No pain    Multiple Pain Sites No             Capillary Blood Glucose: No results found for this or any previous visit (from the past 24 hour(s)).    Social History   Tobacco Use  Smoking Status Never  Smokeless Tobacco Never    Goals Met:  Independence with exercise equipment Exercise tolerated well No report of concerns or symptoms today Strength training completed today  Goals Unmet:  Not Applicable  Comments: Checkout at 1030.   Dr. Carlyle Dolly is Medical Director for [facility]

## 2023-02-23 ENCOUNTER — Encounter (HOSPITAL_COMMUNITY)
Admission: RE | Admit: 2023-02-23 | Discharge: 2023-02-23 | Disposition: A | Discharge status: Home / Self Care | Payer: Medicare Other | Source: Ambulatory Visit | Attending: Cardiothoracic Surgery | Admitting: Cardiothoracic Surgery

## 2023-02-23 ENCOUNTER — Encounter (HOSPITAL_COMMUNITY): Payer: Medicare Other

## 2023-02-23 DIAGNOSIS — Z7901 Long term (current) use of anticoagulants: Secondary | ICD-10-CM | POA: Diagnosis not present

## 2023-02-23 DIAGNOSIS — Z951 Presence of aortocoronary bypass graft: Secondary | ICD-10-CM

## 2023-02-23 DIAGNOSIS — L97822 Non-pressure chronic ulcer of other part of left lower leg with fat layer exposed: Secondary | ICD-10-CM | POA: Diagnosis not present

## 2023-02-23 DIAGNOSIS — Z86718 Personal history of other venous thrombosis and embolism: Secondary | ICD-10-CM | POA: Diagnosis not present

## 2023-02-23 DIAGNOSIS — J449 Chronic obstructive pulmonary disease, unspecified: Secondary | ICD-10-CM | POA: Diagnosis not present

## 2023-02-23 DIAGNOSIS — I10 Essential (primary) hypertension: Secondary | ICD-10-CM | POA: Diagnosis not present

## 2023-02-23 DIAGNOSIS — G4733 Obstructive sleep apnea (adult) (pediatric): Secondary | ICD-10-CM | POA: Diagnosis not present

## 2023-02-23 DIAGNOSIS — L89623 Pressure ulcer of left heel, stage 3: Secondary | ICD-10-CM | POA: Diagnosis not present

## 2023-02-23 DIAGNOSIS — I890 Lymphedema, not elsewhere classified: Secondary | ICD-10-CM | POA: Diagnosis not present

## 2023-02-23 DIAGNOSIS — I872 Venous insufficiency (chronic) (peripheral): Secondary | ICD-10-CM | POA: Diagnosis not present

## 2023-02-23 NOTE — Progress Notes (Signed)
Daily Session Note  Patient Details  Name: CJ PLATEK MRN: [id number] Date of Birth: Feb 02, 1946 Referring Provider:   Flowsheet Row CARDIAC REHAB PHASE II ORIENTATION from 12/16/2022 in [place]  Referring Provider Dr. Tenny Craw       Encounter Date: 02/23/2023  Check In:  Session Check In - 02/23/23 0930       Check-In   Supervising physician immediately available to respond to emergencies [facility] MD immediately available    Physician(s) Dr. Gasper Sells    Location [name]    Staff Present Hoy Register MHA, MS, ACSM-CEP;Leana Roe, BS, Exercise Physiologist    Virtual Visit No    Medication changes reported     No    Fall or balance concerns reported    No    Tobacco Cessation No Change    Warm-up and Cool-down Performed as group-led instruction    Resistance Training Performed Yes    VAD Patient? No    PAD/SET Patient? No      Pain Assessment   Currently in Pain? No/denies    Pain Score 0-No pain    Multiple Pain Sites No             Capillary Blood Glucose: No results found for this or any previous visit (from the past 24 hour(s)).    Social History   Tobacco Use  Smoking Status Never  Smokeless Tobacco Never    Goals Met:  Independence with exercise equipment Exercise tolerated well No report of concerns or symptoms today Strength training completed today  Goals Unmet:  Not Applicable  Comments: check out 1030   Dr. Carlyle Dolly is Medical Director for [place]

## 2023-02-25 ENCOUNTER — Encounter (HOSPITAL_COMMUNITY)
Admission: RE | Admit: 2023-02-25 | Discharge: 2023-02-25 | Disposition: A | Discharge status: Home / Self Care | Payer: Medicare Other | Source: Ambulatory Visit | Attending: Cardiothoracic Surgery | Admitting: Cardiothoracic Surgery

## 2023-02-25 ENCOUNTER — Encounter (HOSPITAL_COMMUNITY): Payer: Medicare Other

## 2023-02-25 DIAGNOSIS — Z1211 Encounter for screening for malignant neoplasm of colon: Secondary | ICD-10-CM | POA: Diagnosis not present

## 2023-02-25 DIAGNOSIS — I872 Venous insufficiency (chronic) (peripheral): Secondary | ICD-10-CM | POA: Diagnosis not present

## 2023-02-25 DIAGNOSIS — J449 Chronic obstructive pulmonary disease, unspecified: Secondary | ICD-10-CM | POA: Diagnosis not present

## 2023-02-25 DIAGNOSIS — E7800 Pure hypercholesterolemia, unspecified: Secondary | ICD-10-CM | POA: Diagnosis not present

## 2023-02-25 DIAGNOSIS — Z951 Presence of aortocoronary bypass graft: Secondary | ICD-10-CM | POA: Diagnosis not present

## 2023-02-25 DIAGNOSIS — M8580 Other specified disorders of bone density and structure, unspecified site: Secondary | ICD-10-CM | POA: Diagnosis not present

## 2023-02-25 DIAGNOSIS — L89623 Pressure ulcer of left heel, stage 3: Secondary | ICD-10-CM | POA: Diagnosis not present

## 2023-02-25 DIAGNOSIS — Z7901 Long term (current) use of anticoagulants: Secondary | ICD-10-CM | POA: Diagnosis not present

## 2023-02-25 DIAGNOSIS — Z0000 Encounter for general adult medical examination without abnormal findings: Secondary | ICD-10-CM | POA: Diagnosis not present

## 2023-02-25 DIAGNOSIS — E1169 Type 2 diabetes mellitus with other specified complication: Secondary | ICD-10-CM | POA: Diagnosis not present

## 2023-02-25 DIAGNOSIS — I2510 Atherosclerotic heart disease of native coronary artery without angina pectoris: Secondary | ICD-10-CM | POA: Diagnosis not present

## 2023-02-25 DIAGNOSIS — L97822 Non-pressure chronic ulcer of other part of left lower leg with fat layer exposed: Secondary | ICD-10-CM | POA: Diagnosis not present

## 2023-02-25 DIAGNOSIS — I700 Atherosclerosis of aorta: Secondary | ICD-10-CM | POA: Diagnosis not present

## 2023-02-25 DIAGNOSIS — E119 Type 2 diabetes mellitus without complications: Secondary | ICD-10-CM | POA: Diagnosis not present

## 2023-02-25 DIAGNOSIS — Z86718 Personal history of other venous thrombosis and embolism: Secondary | ICD-10-CM | POA: Diagnosis not present

## 2023-02-25 DIAGNOSIS — G4733 Obstructive sleep apnea (adult) (pediatric): Secondary | ICD-10-CM | POA: Diagnosis not present

## 2023-02-25 DIAGNOSIS — I890 Lymphedema, not elsewhere classified: Secondary | ICD-10-CM | POA: Diagnosis not present

## 2023-02-25 DIAGNOSIS — I10 Essential (primary) hypertension: Secondary | ICD-10-CM | POA: Diagnosis not present

## 2023-02-25 NOTE — Progress Notes (Signed)
Daily Session Note  Patient Details  Name: MILLICENT SHAREEF MRN: [id number] Date of Birth: November 18, 1946 Referring Provider:   Flowsheet Row CARDIAC REHAB PHASE II ORIENTATION from 12/16/2022 in [place]  Referring Provider Dr. Tenny Craw       Encounter Date: 02/25/2023  Check In:  Session Check In - 02/25/23 1442       Check-In   Supervising physician immediately available to respond to emergencies [facility] MD immediately available    Physician(s) Dr. Harl Bowie    Location [name]    Staff Present Leana Roe, BS, Exercise Physiologist;Kobe Jansma Wynetta Emery, RN, Joanette Gula, RN, BSN    Virtual Visit No    Medication changes reported     No    Fall or balance concerns reported    No    Tobacco Cessation No Change    Warm-up and Cool-down Performed as group-led instruction    Resistance Training Performed Yes    VAD Patient? No    PAD/SET Patient? No      Pain Assessment   Currently in Pain? No/denies    Pain Score 0-No pain    Multiple Pain Sites No             Capillary Blood Glucose: No results found for this or any previous visit (from the past 24 hour(s)).    Social History   Tobacco Use  Smoking Status Never  Smokeless Tobacco Never    Goals Met:  Independence with exercise equipment Exercise tolerated well No report of concerns or symptoms today Strength training completed today  Goals Unmet:  Not Applicable  Comments: Check out 1645.   Dr. Carlyle Dolly is Medical Director for [facility]

## 2023-02-26 ENCOUNTER — Other Ambulatory Visit: Payer: Self-pay | Admitting: Cardiology

## 2023-02-28 ENCOUNTER — Encounter (HOSPITAL_COMMUNITY)
Admission: RE | Admit: 2023-02-28 | Discharge: 2023-02-28 | Disposition: A | Discharge status: Home / Self Care | Payer: Medicare Other | Source: Ambulatory Visit | Attending: Cardiothoracic Surgery | Admitting: Cardiothoracic Surgery

## 2023-02-28 ENCOUNTER — Encounter (HOSPITAL_COMMUNITY): Payer: Medicare Other

## 2023-02-28 VITALS — Wt 158.7 lb

## 2023-02-28 DIAGNOSIS — Z7901 Long term (current) use of anticoagulants: Secondary | ICD-10-CM | POA: Diagnosis not present

## 2023-02-28 DIAGNOSIS — Z951 Presence of aortocoronary bypass graft: Secondary | ICD-10-CM | POA: Diagnosis not present

## 2023-02-28 DIAGNOSIS — L97822 Non-pressure chronic ulcer of other part of left lower leg with fat layer exposed: Secondary | ICD-10-CM | POA: Diagnosis not present

## 2023-02-28 DIAGNOSIS — J449 Chronic obstructive pulmonary disease, unspecified: Secondary | ICD-10-CM | POA: Diagnosis not present

## 2023-02-28 DIAGNOSIS — I872 Venous insufficiency (chronic) (peripheral): Secondary | ICD-10-CM | POA: Diagnosis not present

## 2023-02-28 DIAGNOSIS — Z86718 Personal history of other venous thrombosis and embolism: Secondary | ICD-10-CM | POA: Diagnosis not present

## 2023-02-28 DIAGNOSIS — L89623 Pressure ulcer of left heel, stage 3: Secondary | ICD-10-CM | POA: Diagnosis not present

## 2023-02-28 DIAGNOSIS — G4733 Obstructive sleep apnea (adult) (pediatric): Secondary | ICD-10-CM | POA: Diagnosis not present

## 2023-02-28 DIAGNOSIS — I10 Essential (primary) hypertension: Secondary | ICD-10-CM | POA: Diagnosis not present

## 2023-02-28 DIAGNOSIS — I890 Lymphedema, not elsewhere classified: Secondary | ICD-10-CM | POA: Diagnosis not present

## 2023-02-28 NOTE — Progress Notes (Signed)
Daily Session Note  Patient Details  Name: Sheila Keller MRN: [id number] Date of Birth: 06-Apr-1946 Referring Provider:   Flowsheet Row CARDIAC REHAB PHASE II ORIENTATION from 12/16/2022 in [place]  Referring Provider Dr. Tenny Craw       Encounter Date: 02/28/2023  Check In:  Session Check In - 02/28/23 0931       Check-In   Supervising physician immediately available to respond to emergencies [facility] MD immediately available    Physician(s) Dr Domenic Polite    Location [name]    Staff Present Leana Roe, BS, Exercise Physiologist;Abbygael Curtiss Hassell Done, RN, BSN;Dalton Sherrie George, MS, ACSM-CEP    Virtual Visit No    Medication changes reported     No    Fall or balance concerns reported    No    Tobacco Cessation No Change    Warm-up and Cool-down Performed as group-led instruction    Resistance Training Performed Yes    VAD Patient? No    PAD/SET Patient? No      Pain Assessment   Currently in Pain? No/denies    Pain Score 0-No pain    Multiple Pain Sites No             Capillary Blood Glucose: No results found for this or any previous visit (from the past 24 hour(s)).    Social History   Tobacco Use  Smoking Status Never  Smokeless Tobacco Never    Goals Met:  Independence with exercise equipment Exercise tolerated well No report of concerns or symptoms today Strength training completed today  Goals Unmet:  Not Applicable  Comments: Checkout at 1030.   Dr. Carlyle Dolly is Medical Director for [facility]

## 2023-03-02 ENCOUNTER — Ambulatory Visit: Payer: Medicare Other | Admitting: Obstetrics and Gynecology

## 2023-03-02 ENCOUNTER — Encounter (HOSPITAL_COMMUNITY)
Admission: RE | Admit: 2023-03-02 | Discharge: 2023-03-02 | Disposition: A | Discharge status: Home / Self Care | Payer: Medicare Other | Source: Ambulatory Visit | Attending: Cardiothoracic Surgery | Admitting: Cardiothoracic Surgery

## 2023-03-02 ENCOUNTER — Encounter (HOSPITAL_COMMUNITY): Payer: Medicare Other

## 2023-03-02 DIAGNOSIS — I872 Venous insufficiency (chronic) (peripheral): Secondary | ICD-10-CM | POA: Diagnosis not present

## 2023-03-02 DIAGNOSIS — I890 Lymphedema, not elsewhere classified: Secondary | ICD-10-CM | POA: Diagnosis not present

## 2023-03-02 DIAGNOSIS — Z86718 Personal history of other venous thrombosis and embolism: Secondary | ICD-10-CM | POA: Diagnosis not present

## 2023-03-02 DIAGNOSIS — L97822 Non-pressure chronic ulcer of other part of left lower leg with fat layer exposed: Secondary | ICD-10-CM | POA: Diagnosis not present

## 2023-03-02 DIAGNOSIS — G4733 Obstructive sleep apnea (adult) (pediatric): Secondary | ICD-10-CM | POA: Diagnosis not present

## 2023-03-02 DIAGNOSIS — J449 Chronic obstructive pulmonary disease, unspecified: Secondary | ICD-10-CM | POA: Diagnosis not present

## 2023-03-02 DIAGNOSIS — Z7901 Long term (current) use of anticoagulants: Secondary | ICD-10-CM | POA: Diagnosis not present

## 2023-03-02 DIAGNOSIS — Z951 Presence of aortocoronary bypass graft: Secondary | ICD-10-CM | POA: Diagnosis not present

## 2023-03-02 DIAGNOSIS — L89623 Pressure ulcer of left heel, stage 3: Secondary | ICD-10-CM | POA: Diagnosis not present

## 2023-03-02 DIAGNOSIS — I10 Essential (primary) hypertension: Secondary | ICD-10-CM | POA: Diagnosis not present

## 2023-03-02 NOTE — Progress Notes (Signed)
Daily Session Note  Patient Details  Name: ANAIAH CHAVIANO MRN: [id number] Date of Birth: [birth year] Referring Provider:   Flowsheet Row CARDIAC REHAB PHASE II ORIENTATION from 12/16/2022 in [place]  Referring Provider Dr. Tenny Craw       Encounter Date: 03/02/2023  Check In:  Session Check In - 03/02/23 0930       Check-In   Supervising physician immediately available to respond to emergencies [facility] MD immediately available    Physician(s) Dr Domenic Polite    Location [name]    Staff Present Leana Roe, BS, Exercise Physiologist;Hillary Troutman BSN, RN    Virtual Visit No    Medication changes reported     No    Fall or balance concerns reported    No    Tobacco Cessation No Change    Warm-up and Cool-down Performed as group-led instruction    Resistance Training Performed Yes    VAD Patient? No    PAD/SET Patient? No      Pain Assessment   Currently in Pain? No/denies    Pain Score 0-No pain    Multiple Pain Sites No             Capillary Blood Glucose: No results found for this or any previous visit (from the past 24 hour(s)).    Social History   Tobacco Use  Smoking Status Never  Smokeless Tobacco Never    Goals Met:  Independence with exercise equipment Changing diet to healthy choices, watching portion sizes No report of concerns or symptoms today Strength training completed today  Goals Unmet:  Not Applicable  Comments: check out 1030   Dr. Carlyle Dolly is Medical Director for [place]

## 2023-03-02 NOTE — Progress Notes (Signed)
Cardiac Individual Treatment Plan  Patient Details  Name: Sheila Keller MRN: [id number] Date of Birth: 12-Sep-1946 Referring Provider:   Flowsheet Row CARDIAC REHAB PHASE II ORIENTATION from 12/16/2022 in [facility]  Referring Provider Dr. Tenny Craw       Initial Encounter Date:  Flowsheet Row CARDIAC REHAB PHASE II ORIENTATION from 12/16/2022 in [facility]  Date 12/16/22       Visit Diagnosis: S/P CABG x 3  Patient's Home Medications on Admission:  Current Outpatient Medications:    aspirin EC 325 MG tablet, TAKE 1 TABLET BY MOUTH EVERY DAY, Disp: 90 tablet, Rfl: 3   atorvastatin (LIPITOR) 80 MG tablet, Take 1 tablet (80 mg total) by mouth daily., Disp: 90 tablet, Rfl: 3   Cholecalciferol (VITAMIN D3) 2000 units TABS, Take 2,000 Units by mouth every morning., Disp: , Rfl:    docusate sodium (COLACE) 100 MG capsule, Take 100 mg by mouth daily., Disp: , Rfl:    empagliflozin (JARDIANCE) 25 MG TABS tablet, Take 25 mg by mouth daily., Disp: , Rfl:    furosemide (LASIX) 20 MG tablet, Take 0.5 tablets (10 mg total) by mouth daily. Take 1/2 tablet of the 20 mg per day (10 mg po x7 days) (Patient not taking: Reported on 12/15/2022), Disp: 7 tablet, Rfl: 0   metFORMIN (GLUCOPHAGE) 1000 MG tablet, Take 1,000 mg by mouth 2 (two) times daily., Disp: , Rfl:    metFORMIN (GLUCOPHAGE) 500 MG tablet, Take 1,000 mg by mouth 2 (two) times daily with a meal., Disp: , Rfl: 1   metoprolol tartrate (LOPRESSOR) 25 MG tablet, Take 1 tablet (25 mg total) by mouth 2 (two) times daily., Disp: 90 tablet, Rfl: 2   risedronate (ACTONEL) 150 MG tablet, PLEASE SEE ATTACHED FOR DETAILED DIRECTIONS (Patient not taking: Reported on 01/13/2023), Disp: 3 tablet, Rfl: 1   sitaGLIPtin (JANUVIA) 100 MG tablet, Take 100 mg by mouth daily., Disp: , Rfl:   Past Medical History: Past Medical History:  Diagnosis Date   Anxiety    Breast lump 08/2017   no cancer/ [facility]   Chronic cystitis    Diabetes mellitus    type 2   Elevated cholesterol    Heart murmur    Aortic valve stenosis.  Sees cardiologist with [facility], no current problems per patient   Hypertension    Mild dysplasia of cervix (CIN I)    Osteopenia 02/2018   T score -1.8 FRAX 11% / 2.2%    Tobacco Use: Social History   Tobacco Use  Smoking Status Never  Smokeless Tobacco Never    Labs: Review Flowsheet       Latest Ref Rng & Units 12/28/2007 09/16/2022 09/20/2022 10/13/2022  Labs for ITP Cardiac and Pulmonary Rehab  Cholestrol 100 - 199 mg/dL - - - 110   LDL (calc) 0 - 99 mg/dL - - - 42   HDL-C >39 mg/dL - - - 43   Trlycerides 0 - 149 mg/dL - - - 145   Hemoglobin A1c 4.8 - 5.6 % - 7.0  - -  PH, Arterial 7.35 - 7.45 - 7.42  7.321  7.390  7.386  7.440  7.504  7.378  7.307  -  PCO2 arterial 32 - 48 mmHg - 29  39.8  33.4  34.1  31.0  30.2  38.1  40.2  -  Bicarbonate 20.0 - 28.0 mmol/L 19.7  18.8  20.6  20.2  20.5  21.1  23.8  19.7  22.5  20.1  -  TCO2 22 - 32 mmol/L 21  - 22  21  22  22  23  22  23  25  21  21  24  21  21  21   -  Acid-base deficit 0.0 - 2.0 mmol/L 3.0  4.4  5.0  4.0  4.0  3.0  4.0  2.0  6.0  -  O2 Saturation % - 99  99  99  59.9  100  100  100  74  100  100  -    Capillary Blood Glucose: Lab Results  Component Value Date   GLUCAP 141 (H) 01/21/2023   GLUCAP 151 (H) 12/22/2022   GLUCAP 162 (H) 12/20/2022   GLUCAP 119 (H) 09/27/2022   GLUCAP 118 (H) 09/27/2022    POCT Glucose     Row Name 12/16/22 1232 02/28/23 1053           POCT Blood Glucose   Pre-Exercise 155 mg/dL 152 mg/dL               Exercise Target Goals: Exercise Program Goal: Individual exercise prescription set using results from initial 6 min walk test and THRR while considering  patient's activity barriers and safety.   Exercise Prescription Goal: Starting with aerobic activity 30 plus minutes a day, 3 days per week for initial exercise prescription. Provide home  exercise prescription and guidelines that participant acknowledges understanding prior to discharge.  Activity Barriers & Risk Stratification:  Activity Barriers & Cardiac Risk Stratification - 12/16/22 1303       Activity Barriers & Cardiac Risk Stratification   Activity Barriers None    Cardiac Risk Stratification High             6 Minute Walk:  6 Minute Walk     Row Name 12/16/22 1306         6 Minute Walk   Phase Initial     Distance 1100 feet     Walk Time 6 minutes     # of Rest Breaks 0     MPH 2.08     METS 2.83     RPE 12     VO2 Peak 9.92     Symptoms No     Resting HR 84 bpm     Resting BP 92/60     Resting Oxygen Saturation  97 %     Exercise Oxygen Saturation  during 6 min walk 97 %     Max Ex. HR 98 bpm     Max Ex. BP 110/60     2 Minute Post BP 106/60              Oxygen Initial Assessment:   Oxygen Re-Evaluation:   Oxygen Discharge (Final Oxygen Re-Evaluation):   Initial Exercise Prescription:  Initial Exercise Prescription - 12/16/22 1300       Date of Initial Exercise RX and Referring Provider   Date 12/16/22    Referring Provider Dr. Tenny Craw    Expected Discharge Date 03/11/23      NuStep   Level 1    SPM 80    Minutes 22      Arm Ergometer   Level 1    RPM 45    Minutes 17      Prescription Details   Frequency (times per week) 3    Duration Progress to 30 minutes of continuous aerobic without signs/symptoms of physical distress  Intensity   THRR 40-80% of Max Heartrate 58-115    Ratings of Perceived Exertion 11-13    Perceived Dyspnea 0-4      Resistance Training   Training Prescription Yes    Weight 3    Reps 10-15             Perform Capillary Blood Glucose checks as needed.  Exercise Prescription Changes:   Exercise Prescription Changes     Row Name 12/20/22 1200 01/03/23 1500 01/07/23 1000 01/17/23 1400 01/31/23 1000     Response to Exercise   Blood Pressure (Admit) 106/66 112/60 --  112/60 108/56   Blood Pressure (Exercise) 110/60 110/62 -- 118/62 104/58   Blood Pressure (Exit) 108/60 96/64 -- 108/60 104/70   Heart Rate (Admit) 84 bpm 74 bpm -- 72 bpm 92 bpm   Heart Rate (Exercise) 93 bpm 105 bpm -- 96 bpm 116 bpm   Heart Rate (Exit) 91 bpm 83 bpm -- 80 bpm 97 bpm   Rating of Perceived Exertion (Exercise) 13 12 -- 12 12   Duration Continue with 30 min of aerobic exercise without signs/symptoms of physical distress. Continue with 30 min of aerobic exercise without signs/symptoms of physical distress. -- Continue with 30 min of aerobic exercise without signs/symptoms of physical distress. Continue with 30 min of aerobic exercise without signs/symptoms of physical distress.   Intensity THRR unchanged THRR unchanged -- THRR unchanged THRR unchanged     Progression   Progression Continue to progress workloads to maintain intensity without signs/symptoms of physical distress. Continue to progress workloads to maintain intensity without signs/symptoms of physical distress. -- Continue to progress workloads to maintain intensity without signs/symptoms of physical distress. Continue to progress workloads to maintain intensity without signs/symptoms of physical distress.     Resistance Training   Training Prescription Yes Yes -- Yes Yes   Weight 2 2 -- 2.0 2   Reps 10-15 10-15 -- 10-15 10-15   Time 10 Minutes 10 Minutes -- 10 Minutes 10 Minutes     NuStep   Level 1 1 -- 2 3   SPM 43 93 -- 96 96   Minutes 39 39 -- 39 39   METs 1.52 2.63 -- 2.64 2.84     Home Exercise Plan   Plans to continue exercise at -- -- [employer] (comment) -- --   Frequency -- -- Add 2 additional days to program exercise sessions. -- --   Initial Home Exercises Provided -- -- 01/07/23 -- --    [place] Name 02/14/23 1000 02/28/23 1000           Response to Exercise   Blood Pressure (Admit) 104/60 116/64      Blood Pressure (Exercise) 106/58 106/62      Blood Pressure (Exit) 118/62 102/64       Heart Rate (Admit) 87 bpm 99 bpm      Heart Rate (Exercise) 121 bpm 115 bpm      Heart Rate (Exit) 96 bpm 90 bpm      Rating of Perceived Exertion (Exercise) 12 12      Duration Continue with 30 min of aerobic exercise without signs/symptoms of physical distress. Continue with 30 min of aerobic exercise without signs/symptoms of physical distress.      Intensity THRR unchanged THRR unchanged        Progression   Progression Continue to progress workloads to maintain intensity without signs/symptoms of physical distress. Continue to progress workloads to maintain intensity without signs/symptoms of  physical distress.        Resistance Training   Training Prescription Yes Yes      Weight 2 2      Reps 10-15 10-15      Time 10 Minutes 10 Minutes        NuStep   Level 3 3      SPM 92 99      Minutes 39 39      METs 2.78 3.2               Exercise Comments:   Exercise Comments     Row Name 01/07/23 1004           Exercise Comments home exercise reviewed                Exercise Goals and Review:   Exercise Goals     Row Name 12/16/22 1313 01/03/23 1545 01/31/23 1039 02/28/23 1054       Exercise Goals   Increase Physical Activity Yes Yes Yes Yes    Intervention Provide advice, education, support and counseling about physical activity/exercise needs.;Develop an individualized exercise prescription for aerobic and resistive training based on initial evaluation findings, risk stratification, comorbidities and participant's personal goals. Provide advice, education, support and counseling about physical activity/exercise needs.;Develop an individualized exercise prescription for aerobic and resistive training based on initial evaluation findings, risk stratification, comorbidities and participant's personal goals. Provide advice, education, support and counseling about physical activity/exercise needs.;Develop an individualized exercise prescription for aerobic and  resistive training based on initial evaluation findings, risk stratification, comorbidities and participant's personal goals. Provide advice, education, support and counseling about physical activity/exercise needs.;Develop an individualized exercise prescription for aerobic and resistive training based on initial evaluation findings, risk stratification, comorbidities and participant's personal goals.    Expected Outcomes Short Term: Attend rehab on a regular basis to increase amount of physical activity.;Long Term: Add in home exercise to make exercise part of routine and to increase amount of physical activity.;Long Term: Exercising regularly at least 3-5 days a week. Short Term: Attend rehab on a regular basis to increase amount of physical activity.;Long Term: Add in home exercise to make exercise part of routine and to increase amount of physical activity.;Long Term: Exercising regularly at least 3-5 days a week. Short Term: Attend rehab on a regular basis to increase amount of physical activity.;Long Term: Add in home exercise to make exercise part of routine and to increase amount of physical activity.;Long Term: Exercising regularly at least 3-5 days a week. Short Term: Attend rehab on a regular basis to increase amount of physical activity.;Long Term: Add in home exercise to make exercise part of routine and to increase amount of physical activity.;Long Term: Exercising regularly at least 3-5 days a week.    Increase Strength and Stamina Yes Yes Yes Yes    Intervention Provide advice, education, support and counseling about physical activity/exercise needs.;Develop an individualized exercise prescription for aerobic and resistive training based on initial evaluation findings, risk stratification, comorbidities and participant's personal goals. Provide advice, education, support and counseling about physical activity/exercise needs.;Develop an individualized exercise prescription for aerobic and  resistive training based on initial evaluation findings, risk stratification, comorbidities and participant's personal goals. Provide advice, education, support and counseling about physical activity/exercise needs.;Develop an individualized exercise prescription for aerobic and resistive training based on initial evaluation findings, risk stratification, comorbidities and participant's personal goals. Provide advice, education, support and counseling about physical activity/exercise needs.;Develop an individualized exercise prescription for aerobic  and resistive training based on initial evaluation findings, risk stratification, comorbidities and participant's personal goals.    Expected Outcomes Short Term: Increase workloads from initial exercise prescription for resistance, speed, and METs.;Short Term: Perform resistance training exercises routinely during rehab and add in resistance training at home;Long Term: Improve cardiorespiratory fitness, muscular endurance and strength as measured by increased METs and functional capacity (6MWT) Short Term: Increase workloads from initial exercise prescription for resistance, speed, and METs.;Short Term: Perform resistance training exercises routinely during rehab and add in resistance training at home;Long Term: Improve cardiorespiratory fitness, muscular endurance and strength as measured by increased METs and functional capacity (6MWT) Short Term: Increase workloads from initial exercise prescription for resistance, speed, and METs.;Short Term: Perform resistance training exercises routinely during rehab and add in resistance training at home;Long Term: Improve cardiorespiratory fitness, muscular endurance and strength as measured by increased METs and functional capacity (6MWT) Short Term: Increase workloads from initial exercise prescription for resistance, speed, and METs.;Short Term: Perform resistance training exercises routinely during rehab and add in  resistance training at home;Long Term: Improve cardiorespiratory fitness, muscular endurance and strength as measured by increased METs and functional capacity (6MWT)    Able to understand and use rate of perceived exertion (RPE) scale Yes Yes Yes Yes    Intervention Provide education and explanation on how to use RPE scale Provide education and explanation on how to use RPE scale Provide education and explanation on how to use RPE scale Provide education and explanation on how to use RPE scale    Expected Outcomes Short Term: Able to use RPE daily in rehab to express subjective intensity level;Long Term:  Able to use RPE to guide intensity level when exercising independently Short Term: Able to use RPE daily in rehab to express subjective intensity level;Long Term:  Able to use RPE to guide intensity level when exercising independently Short Term: Able to use RPE daily in rehab to express subjective intensity level;Long Term:  Able to use RPE to guide intensity level when exercising independently Short Term: Able to use RPE daily in rehab to express subjective intensity level;Long Term:  Able to use RPE to guide intensity level when exercising independently    Knowledge and understanding of Target Heart Rate Range (THRR) Yes Yes Yes Yes    Intervention Provide education and explanation of THRR including how the numbers were predicted and where they are located for reference Provide education and explanation of THRR including how the numbers were predicted and where they are located for reference Provide education and explanation of THRR including how the numbers were predicted and where they are located for reference Provide education and explanation of THRR including how the numbers were predicted and where they are located for reference    Expected Outcomes Long Term: Able to use THRR to govern intensity when exercising independently;Short Term: Able to state/look up THRR;Short Term: Able to use daily as  guideline for intensity in rehab Long Term: Able to use THRR to govern intensity when exercising independently;Short Term: Able to state/look up THRR;Short Term: Able to use daily as guideline for intensity in rehab Long Term: Able to use THRR to govern intensity when exercising independently;Short Term: Able to state/look up THRR;Short Term: Able to use daily as guideline for intensity in rehab Long Term: Able to use THRR to govern intensity when exercising independently;Short Term: Able to state/look up THRR;Short Term: Able to use daily as guideline for intensity in rehab    Able to check  pulse independently Yes Yes Yes Yes    Intervention Provide education and demonstration on how to check pulse in carotid and radial arteries.;Review the importance of being able to check your own pulse for safety during independent exercise Provide education and demonstration on how to check pulse in carotid and radial arteries.;Review the importance of being able to check your own pulse for safety during independent exercise Provide education and demonstration on how to check pulse in carotid and radial arteries.;Review the importance of being able to check your own pulse for safety during independent exercise Provide education and demonstration on how to check pulse in carotid and radial arteries.;Review the importance of being able to check your own pulse for safety during independent exercise    Expected Outcomes Short Term: Able to explain why pulse checking is important during independent exercise;Long Term: Able to check pulse independently and accurately Short Term: Able to explain why pulse checking is important during independent exercise;Long Term: Able to check pulse independently and accurately Short Term: Able to explain why pulse checking is important during independent exercise;Long Term: Able to check pulse independently and accurately Short Term: Able to explain why pulse checking is important during  independent exercise;Long Term: Able to check pulse independently and accurately    Understanding of Exercise Prescription Yes Yes Yes Yes    Intervention Provide education, explanation, and written materials on patient's individual exercise prescription Provide education, explanation, and written materials on patient's individual exercise prescription Provide education, explanation, and written materials on patient's individual exercise prescription Provide education, explanation, and written materials on patient's individual exercise prescription    Expected Outcomes Short Term: Able to explain program exercise prescription;Long Term: Able to explain home exercise prescription to exercise independently Short Term: Able to explain program exercise prescription;Long Term: Able to explain home exercise prescription to exercise independently Short Term: Able to explain program exercise prescription;Long Term: Able to explain home exercise prescription to exercise independently Short Term: Able to explain program exercise prescription;Long Term: Able to explain home exercise prescription to exercise independently             Exercise Goals Re-Evaluation :  Exercise Goals Re-Evaluation     Row Name 01/03/23 1545 01/31/23 1039 02/28/23 1054         Exercise Goal Re-Evaluation   Exercise Goals Review Increase Physical Activity;Able to understand and use rate of perceived exertion (RPE) scale;Increase Strength and Stamina;Knowledge and understanding of Target Heart Rate Range (THRR);Able to check pulse independently;Understanding of Exercise Prescription Increase Strength and Stamina;Increase Physical Activity;Able to understand and use rate of perceived exertion (RPE) scale;Knowledge and understanding of Target Heart Rate Range (THRR);Able to check pulse independently;Understanding of Exercise Prescription Increase Strength and Stamina;Increase Physical Activity;Able to understand and use rate of  perceived exertion (RPE) scale;Knowledge and understanding of Target Heart Rate Range (THRR);Able to check pulse independently;Understanding of Exercise Prescription     Comments Pt has comepleted 8 sessions of CR. She stated that when using the arms on the stepper she felt it was pulling at her chest so we lowered the arms and told her to just use her legs. She just started using the arms again the past session and feels fine. She has increased her workload each class by increasing her SPM. She is currently exercising at 2.63 METs on the stepper. Will continue to monitor and progress as able. Pt has completed 20 sessions of cardiac rehab. She is improving since she started the program and is egar to increase  her workload. She continues to use her arms on the stepper and no longer states that is feels like her chest was pulling. She ask questions throughout the week about the stepper and is egar to be informed. She is currently exercising at 2.84 METs on the stepper. Will continue to monitor and progress as able. Pt has completed 32 sessions of CR. She continues to progress with her workloads and enjoys her sessions. Her chest continues to get stronger and she no longer has any complaints about the stepper on her chest. She continues to push herself in rehab. She is currently exercising at 3.20 METs on the stepper. She will be graduating the program on 4/5, so we will discuss with her what her exercise plans are post CR. Will continue to monitor and progress as able.     Expected Outcomes Through exercise at rehab and home, the patient will meet their stated goals Through exercise at rehab and home, the patient will meet their stated goals Through exercise at rehab and home, the patient will meet their stated goals               Discharge Exercise Prescription (Final Exercise Prescription Changes):  Exercise Prescription Changes - 02/28/23 1000       Response to Exercise   Blood Pressure (Admit)  116/64    Blood Pressure (Exercise) 106/62    Blood Pressure (Exit) 102/64    Heart Rate (Admit) 99 bpm    Heart Rate (Exercise) 115 bpm    Heart Rate (Exit) 90 bpm    Rating of Perceived Exertion (Exercise) 12    Duration Continue with 30 min of aerobic exercise without signs/symptoms of physical distress.    Intensity THRR unchanged      Progression   Progression Continue to progress workloads to maintain intensity without signs/symptoms of physical distress.      Resistance Training   Training Prescription Yes    Weight 2    Reps 10-15    Time 10 Minutes      NuStep   Level 3    SPM 99    Minutes 39    METs 3.2             Nutrition:  Target Goals: Understanding of nutrition guidelines, daily intake of sodium 1500mg , cholesterol 200mg , calories 30% from fat and 7% or less from saturated fats, daily to have 5 or more servings of fruits and vegetables.  Biometrics:  Pre Biometrics - 12/16/22 1314       Pre Biometrics   Height 5' 3.5" (1.613 m)    Weight 69.9 kg    Waist Circumference 38 inches    Hip Circumference 40 inches    Waist to Hip Ratio 0.95 %    BMI (Calculated) 26.87    Triceps Skinfold 18 mm    % Body Fat 38 %    Grip Strength 12.6 kg    Flexibility 0 in    Single Leg Stand 0 seconds              Nutrition Therapy Plan and Nutrition Goals:  Nutrition Therapy & Goals - 12/27/22 1026       Personal Nutrition Goals   Comments Patient scored 43 on her diet assessment. We offer 2 educational sessions on heart healthy nutrition with handouts and assistance with RD referral if patient is interested.      Intervention Plan   Intervention Nutrition handout(s) given to patient.    Expected Outcomes  Short Term Goal: Understand basic principles of dietary content, such as calories, fat, sodium, cholesterol and nutrients.             Nutrition Assessments:  Nutrition Assessments - 12/16/22 1306       MEDFICTS Scores   Pre Score 43             MEDIFICTS Score Key: ?70 Need to make dietary changes  40-70 Heart Healthy Diet ? 40 Therapeutic Level Cholesterol Diet   Picture Your Plate Scores: [id number] Unhealthy dietary pattern with much room for improvement. 41-50 Dietary pattern unlikely to meet recommendations for good health and room for improvement. 51-60 More healthful dietary pattern, with some room for improvement.  >60 Healthy dietary pattern, although there may be some specific behaviors that could be improved.    Nutrition Goals Re-Evaluation:   Nutrition Goals Discharge (Final Nutrition Goals Re-Evaluation):   Psychosocial: Target Goals: Acknowledge presence or absence of significant depression and/or stress, maximize coping skills, provide positive support system. Participant is able to verbalize types and ability to use techniques and skills needed for reducing stress and depression.  Initial Review & Psychosocial Screening:  Initial Psych Review & Screening - 12/16/22 1305       Initial Review   Current issues with None Identified      Family Dynamics   Good Support System? Yes    Comments Her 2 children and her friends are her support system.      Barriers   Psychosocial barriers to participate in program There are no identifiable barriers or psychosocial needs.      Screening Interventions   Interventions Encouraged to exercise    Expected Outcomes Long Term goal: The participant improves quality of Life and PHQ9 Scores as seen by post scores and/or verbalization of changes;Short Term goal: Identification and review with participant of any Quality of Life or Depression concerns found by scoring the questionnaire.             Quality of Life Scores:  Quality of Life - 12/16/22 1403       Quality of Life   Select Quality of Life      Quality of Life Scores   Health/Function Pre 21.6 %    Socioeconomic Pre 19.5 %    Psych/Spiritual Pre 24 %    Family Pre 18 %    GLOBAL Pre  21.23 %            Scores of 19 and below usually indicate a poorer quality of life in these areas.  A difference of  2-3 points is a clinically meaningful difference.  A difference of 2-3 points in the total score of the Quality of Life Index has been associated with significant improvement in overall quality of life, self-image, physical symptoms, and general health in studies assessing change in quality of life.  PHQ-9: Review Flowsheet       02/17/2023 12/16/2022  Depression screen PHQ 2/9  Decreased Interest 0 0  Down, Depressed, Hopeless 0 0  PHQ - 2 Score 0 0  Altered sleeping - 0  Tired, decreased energy - 0  Change in appetite - 0  Feeling bad or failure about yourself  - 0  Trouble concentrating - 0  Moving slowly or fidgety/restless - 0  Suicidal thoughts - 0  PHQ-9 Score - 0  Difficult doing work/chores - Not difficult at all   Interpretation of Total Score  Total Score Depression Severity:  1-4 = Minimal depression,  5-9 = Mild depression, 10-14 = Moderate depression, 15-19 = Moderately severe depression, 20-27 = Severe depression   Psychosocial Evaluation and Intervention:  Psychosocial Evaluation - 12/16/22 1421       Psychosocial Evaluation & Interventions   Interventions Encouraged to exercise with the program and follow exercise prescription;Relaxation education;Stress management education    Comments Pt has no barriers to participating in CR. She has no identifiable psychosocial issues and scored a 0 on her PHQ-9. She is very fearful of COVID, and states that she does not get out of the house much anymore due to this fear. She was previously walking a lot at her house, but she has not been doing any walking since her CABG. She reports that she has a good support system with her children and her friends. She has a large group of friends that she keeps very close with. Her goals while in the program are to return to her normal ADL's. She is eager to start the  program.    Expected Outcomes Pt will continue to have no identifiable psychosocial issues.    Continue Psychosocial Services  No Follow up required             Psychosocial Re-Evaluation:  Psychosocial Re-Evaluation     [place] Name 12/27/22 1020 01/24/23 1025 02/21/23 1029         Psychosocial Re-Evaluation   Current issues with None Identified None Identified None Identified     Comments Patient is new to the program completing 4 sessions. She continues to have no psychosocial barriers identified. She seems to enjoy the sessions and demonstrates an interest in improving her health. We will continue to monitor her progress. Patient has completed 16 sessions. She continues to have no psychosocial barriers identified. She continues to enjoy the sessions and demonstrates an interest in improving her health. We will continue to monitor her progress. Patient has completed 28 sessions. She continues to have no psychosocial barriers identified. She continues to enjoy the sessions and demonstrates an interest in improving her health. We will continue to monitor her progress.     Expected Outcomes Patient will complete the program meeting both personal and program goals. Patient will complete the program meeting both personal and program goals. Patient will complete the program meeting both personal and program goals.     Interventions Stress management education;Encouraged to attend Cardiac Rehabilitation for the exercise;Relaxation education Stress management education;Encouraged to attend Cardiac Rehabilitation for the exercise;Relaxation education Stress management education;Encouraged to attend Cardiac Rehabilitation for the exercise;Relaxation education     Continue Psychosocial Services  No Follow up required No Follow up required No Follow up required              Psychosocial Discharge (Final Psychosocial Re-Evaluation):  Psychosocial Re-Evaluation - 02/21/23 1029       Psychosocial  Re-Evaluation   Current issues with None Identified    Comments Patient has completed 28 sessions. She continues to have no psychosocial barriers identified. She continues to enjoy the sessions and demonstrates an interest in improving her health. We will continue to monitor her progress.    Expected Outcomes Patient will complete the program meeting both personal and program goals.    Interventions Stress management education;Encouraged to attend Cardiac Rehabilitation for the exercise;Relaxation education    Continue Psychosocial Services  No Follow up required             Vocational Rehabilitation: Provide vocational rehab assistance to qualifying candidates.   Vocational Rehab  Evaluation & Intervention:  Vocational Rehab - 12/16/22 1243       Initial Vocational Rehab Evaluation & Intervention   Assessment shows need for Vocational Rehabilitation No      Vocational Rehab Re-Evaulation   Comments She is retired             Education: Education Goals: Education classes will be provided on a weekly basis, covering required topics. Participant will state understanding/return demonstration of topics presented.  Learning Barriers/Preferences:  Learning Barriers/Preferences - 12/16/22 1242       Learning Barriers/Preferences   Learning Barriers None    Learning Preferences Written Material             Education Topics: Hypertension, Hypertension Reduction -Define heart disease and high blood pressure. Discus how high blood pressure affects the body and ways to reduce high blood pressure.   Exercise and Your Heart -Discuss why it is important to exercise, the FITT principles of exercise, normal and abnormal responses to exercise, and how to exercise safely.   Angina -Discuss definition of angina, causes of angina, treatment of angina, and how to decrease risk of having angina. Flowsheet Row CARDIAC REHAB PHASE II EXERCISE from 02/23/2023 in [place]  Date 12/22/22  Educator DF  Instruction Review Code 1- Verbalizes Understanding       Cardiac Medications -Review what the following cardiac medications are used for, how they affect the body, and side effects that may occur when taking the medications.  Medications include Aspirin, Beta blockers, calcium channel blockers, ACE Inhibitors, angiotensin receptor blockers, diuretics, digoxin, and antihyperlipidemics. Flowsheet Row CARDIAC REHAB PHASE II EXERCISE from 02/23/2023 in [place]  Date 01/05/23  Educator DF  Instruction Review Code 2- Demonstrated Understanding       Congestive Heart Failure -Discuss the definition of CHF, how to live with CHF, the signs and symptoms of CHF, and how keep track of weight and sodium intake. Flowsheet Row CARDIAC REHAB PHASE II EXERCISE from 02/23/2023 in [place]  Date 12/29/22  Educator DF  Instruction Review Code 2- Demonstrated Understanding       Heart Disease and Intimacy -Discus the effect sexual activity has on the heart, how changes occur during intimacy as we age, and safety during sexual activity. Flowsheet Row CARDIAC REHAB PHASE II EXERCISE from 02/23/2023 in [place]  Date 01/19/23  Educator DF  Instruction Review Code 1- Verbalizes Understanding       Smoking Cessation / COPD -Discuss different methods to quit smoking, the health benefits of quitting smoking, and the definition of COPD. Flowsheet Row CARDIAC REHAB PHASE II EXERCISE from 02/23/2023 in [place]  Date 01/26/23  Educator DF  Instruction Review Code 2- Demonstrated Understanding       Nutrition I: Fats -Discuss the types of cholesterol, what cholesterol does to the heart, and how cholesterol levels can be controlled.   Nutrition II: Labels -Discuss the different components of food labels and how to read food label [place] from 02/23/2023 in [place]  Date 02/09/23  Educator DF  Instruction Review Code 2- Demonstrated Understanding       Heart Parts/Heart Disease and PAD -Discuss the anatomy of the heart, the pathway of blood circulation through the heart, and these are affected by heart disease.   Stress I: Signs and Symptoms -Discuss the causes of stress, how stress may lead to anxiety and  depression, and ways to limit stress. Flowsheet Row CARDIAC REHAB PHASE II EXERCISE from 02/23/2023 in [place]  Date 02/23/23  Educator HB  Instruction Review Code 2- Demonstrated Understanding       Stress II: Relaxation -Discuss different types of relaxation techniques to limit stress.   Warning Signs of Stroke / TIA -Discuss definition of a stroke, what the signs and symptoms are of a stroke, and how to identify when someone is having stroke.   Knowledge Questionnaire Score:  Knowledge Questionnaire Score - 12/16/22 1245       Knowledge Questionnaire Score   Pre Score 10/24             Core Components/Risk Factors/Patient Goals at Admission:  Personal Goals and Risk Factors at Admission - 12/16/22 1316       Core Components/Risk Factors/Patient Goals on Admission   Improve shortness of breath with ADL's Yes    Intervention Provide education, individualized exercise plan and daily activity instruction to help decrease symptoms of SOB with activities of daily living.    Expected Outcomes Short Term: Improve cardiorespiratory fitness to achieve a reduction of symptoms when performing ADLs;Long Term: Be able to perform more ADLs without symptoms or delay the onset of symptoms    Diabetes Yes    Intervention Provide education about signs/symptoms and action to take for hypo/hyperglycemia.;Provide education about proper nutrition, including hydration, and aerobic/resistive exercise prescription along with prescribed medications  to achieve blood glucose in normal ranges: Fasting glucose 65-99 mg/dL    Expected Outcomes Short Term: Participant verbalizes understanding of the signs/symptoms and immediate care of hyper/hypoglycemia, proper foot care and importance of medication, aerobic/resistive exercise and nutrition plan for blood glucose control.;Long Term: Attainment of HbA1C < 7%.    Hypertension Yes    Intervention Provide education on lifestyle modifcations including regular physical activity/exercise, weight management, moderate sodium restriction and increased consumption of fresh fruit, vegetables, and low fat dairy, alcohol moderation, and smoking cessation.;Monitor prescription use compliance.    Expected Outcomes Short Term: Continued assessment and intervention until BP is < 140/53mm HG in hypertensive participants. < 130/3mm HG in hypertensive participants with diabetes, heart failure or chronic kidney disease.;Long Term: Maintenance of blood pressure at goal levels.    Lipids Yes    Intervention Provide education and support for participant on nutrition & aerobic/resistive exercise along with prescribed medications to achieve LDL 70mg , HDL >40mg .    Expected Outcomes Short Term: Participant states understanding of desired cholesterol values and is compliant with medications prescribed. Participant is following exercise prescription and nutrition guidelines.;Long Term: Cholesterol controlled with medications as prescribed, with individualized exercise RX and with personalized nutrition plan. Value goals: LDL < 70mg , HDL > 40 mg.             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 She has multiple risk factors for CAD and is participating in the program for risk modification. She has completed 4 sessions. Her current weight is 154.8 lbs up 0.2 bls from her orientation weight. She is doing well in the program. Her DM is managed with [place],  [place]ard[place], and Metformin. Her last A1C on file was 09/16/22 at 7.0%. Her blood pressure is well controlled. Her personal goals for the program are to get stronger; get her heart healthier; and return to her normal ADL's. We will continue to monitor her progress as she works towards meeting these goals. Patient has completed 16 sessions. Her current weight is 152.6 lbs down 2.2 lbs since last 30 day review. She continues to do well in the program with consistent attendance and progressions. She saw Dr. Gardiner Rhyme 2/8 for routine follow up. No changes made. She continues to have aortic stenosis with plan to do a TAVR later when the AS progresses to severe. Her DM continues to be managed with Sherren Mocha, and Metformin. Her last A1C on file was 09/16/22 at 7.0%. Her blood pressure is at goal. Her personal goals for the program continue to be to get stronger; get her heart healthier; and return to her normal ADL's. We will continue to monitor her progress as she works towards meeting these goals. Patient has completed 28 sessions. Her current weight is 159.2 lbs gaining 6.6 lbs since last 30 day review. She continues to do well in the program with consistent attendance and progressions. She saw Jearld Fenton for a nutrition consult 3/14. She said it was very informative and she learned a lot and plans to try and modify her diet some. Her DM continues to be managed with Sherren Mocha, and Metformin. Her last A1C on file was 09/16/22 at 7.0%. Her blood pressure continues to be at goal. Her personal goals for the program  continue to be to get stronger; get her heart healthier; and return to her normal ADL's. We will continue to monitor her progress as she works towards meeting these goals.     Expected Outcomes Patient will complete the program meeting both personal and program goals. Patient will complete the program meeting both personal and program goals. Patient will complete the program meeting both personal and program goals.              Core Components/Risk Factors/Patient Goals at Discharge (Final Review):   Goals and Risk Factor Review - 02/21/23 1029       Core Components/Risk Factors/Patient Goals Review   Personal Goals Review Improve shortness of breath with ADL's;Diabetes;Lipids;Hypertension;Other    Review Patient has completed 28 sessions. Her current weight is 159.2 lbs gaining 6.6 lbs since last 30 day review. She continues to do well in the program with consistent attendance and progressions. She saw Jearld Fenton for a nutrition consult 3/14. She said it was very informative and she learned a lot and plans to try and modify her diet some. Her DM continues to be managed with Sherren Mocha, and Metformin. Her last A1C on file was 09/16/22 at 7.0%. Her blood pressure continues to be at goal. Her personal goals for the program continue to be to get stronger; get her heart healthier; and return to her normal ADL's. We will continue to monitor her progress as she works towards meeting these goals.    Expected Outcomes Patient will complete the program meeting both personal and program goals.             ITP Comments:   Comments: ITP REVIEW Pt is making expected progress toward Cardiac Rehab goals after completing 32 sessions. Recommend continued exercise, life style modification, education, and increased stamina and strength.

## 2023-03-04 ENCOUNTER — Encounter (HOSPITAL_COMMUNITY)
Admission: RE | Admit: 2023-03-04 | Discharge: 2023-03-04 | Disposition: A | Discharge status: Home / Self Care | Payer: Medicare Other | Source: Ambulatory Visit | Attending: Cardiothoracic Surgery | Admitting: Cardiothoracic Surgery

## 2023-03-04 ENCOUNTER — Encounter (HOSPITAL_COMMUNITY): Payer: Medicare Other

## 2023-03-04 DIAGNOSIS — I872 Venous insufficiency (chronic) (peripheral): Secondary | ICD-10-CM | POA: Diagnosis not present

## 2023-03-04 DIAGNOSIS — G4733 Obstructive sleep apnea (adult) (pediatric): Secondary | ICD-10-CM | POA: Diagnosis not present

## 2023-03-04 DIAGNOSIS — Z951 Presence of aortocoronary bypass graft: Secondary | ICD-10-CM

## 2023-03-04 DIAGNOSIS — L89623 Pressure ulcer of left heel, stage 3: Secondary | ICD-10-CM | POA: Diagnosis not present

## 2023-03-04 DIAGNOSIS — L97822 Non-pressure chronic ulcer of other part of left lower leg with fat layer exposed: Secondary | ICD-10-CM | POA: Diagnosis not present

## 2023-03-04 DIAGNOSIS — Z86718 Personal history of other venous thrombosis and embolism: Secondary | ICD-10-CM | POA: Diagnosis not present

## 2023-03-04 DIAGNOSIS — I10 Essential (primary) hypertension: Secondary | ICD-10-CM | POA: Diagnosis not present

## 2023-03-04 DIAGNOSIS — Z7901 Long term (current) use of anticoagulants: Secondary | ICD-10-CM | POA: Diagnosis not present

## 2023-03-04 DIAGNOSIS — J449 Chronic obstructive pulmonary disease, unspecified: Secondary | ICD-10-CM | POA: Diagnosis not present

## 2023-03-04 DIAGNOSIS — I890 Lymphedema, not elsewhere classified: Secondary | ICD-10-CM | POA: Diagnosis not present

## 2023-03-04 NOTE — Progress Notes (Signed)
Daily Session Note  Patient Details  Name: Sheila Keller MRN: [id number] Date of Birth: 1946/01/21 Referring Provider:   Flowsheet Row CARDIAC REHAB PHASE II ORIENTATION from 12/16/2022 in [place]  Referring Provider Dr. Tenny Craw       Encounter Date: 03/04/2023  Check In:  Session Check In - 03/04/23 0929       Check-In   Supervising physician immediately available to respond to emergencies [facility] MD immediately available    Physician(s) Dr. Harrington Challenger    Location [name]    Staff Present Hoy Register MHA, MS, ACSM-CEP;Madelyn Flavors, RN, BSN    Virtual Visit No    Medication changes reported     No    Fall or balance concerns reported    No    Tobacco Cessation No Change    Warm-up and Cool-down Performed as group-led instruction    Resistance Training Performed Yes    VAD Patient? No    PAD/SET Patient? No      Pain Assessment   Currently in Pain? No/denies    Pain Score 0-No pain    Multiple Pain Sites No             Capillary Blood Glucose: No results found for this or any previous visit (from the past 24 hour(s)).    Social History   Tobacco Use  Smoking Status Never  Smokeless Tobacco Never    Goals Met:  Independence with exercise equipment Exercise tolerated well No report of concerns or symptoms today Strength training completed today  Goals Unmet:  Not Applicable  Comments: checkout time is 1030   Dr. Carlyle Dolly is Medical Director for [place]

## 2023-03-07 ENCOUNTER — Encounter (HOSPITAL_COMMUNITY): Payer: Medicare Other

## 2023-03-07 ENCOUNTER — Encounter (HOSPITAL_COMMUNITY)
Admission: RE | Admit: 2023-03-07 | Discharge: 2023-03-07 | Disposition: A | Discharge status: Home / Self Care | Payer: Medicare Other | Source: Ambulatory Visit | Attending: Cardiothoracic Surgery | Admitting: Cardiothoracic Surgery

## 2023-03-07 DIAGNOSIS — Z951 Presence of aortocoronary bypass graft: Secondary | ICD-10-CM | POA: Diagnosis not present

## 2023-03-07 NOTE — Progress Notes (Signed)
Daily Session Note  Patient Details  Name: Sheila Keller MRN: [id number] Date of Birth: July 07, 1946 Referring Provider:   Flowsheet Row CARDIAC REHAB PHASE II ORIENTATION from 12/16/2022 in [place]  Referring Provider Dr. Tenny Craw       Encounter Date: 03/07/2023  Check In:  Session Check In - 03/07/23 0930       Check-In   Supervising physician immediately available to respond to emergencies [facility] MD immediately available    Physician(s) Dr Dellia Cloud    Location [name]    Staff Present Hoy Register MHA, MS, ACSM-CEP;Madelyn Flavors, RN, BSN;Heather Mel Almond, BS, Exercise Physiologist    Virtual Visit No    Medication changes reported     No    Fall or balance concerns reported    No    Tobacco Cessation No Change    Warm-up and Cool-down Performed as group-led instruction    Resistance Training Performed Yes    VAD Patient? No    PAD/SET Patient? No      Pain Assessment   Currently in Pain? No/denies    Pain Score 0-No pain    Multiple Pain Sites No             Capillary Blood Glucose: No results found for this or any previous visit (from the past 24 hour(s)).    Social History   Tobacco Use  Smoking Status Never  Smokeless Tobacco Never    Goals Met:  Independence with exercise equipment Exercise tolerated well No report of concerns or symptoms today Strength training completed today  Goals Unmet:  Not Applicable  Comments: Checkout at 1030.   Dr. Carlyle Dolly is Medical Director for [facility]

## 2023-03-09 ENCOUNTER — Encounter (HOSPITAL_COMMUNITY)
Admission: RE | Admit: 2023-03-09 | Discharge: 2023-03-09 | Disposition: A | Discharge status: Home / Self Care | Payer: Medicare Other | Source: Ambulatory Visit | Attending: Cardiothoracic Surgery | Admitting: Cardiothoracic Surgery

## 2023-03-09 ENCOUNTER — Encounter (HOSPITAL_COMMUNITY): Payer: Medicare Other

## 2023-03-09 DIAGNOSIS — Z951 Presence of aortocoronary bypass graft: Secondary | ICD-10-CM

## 2023-03-09 LAB — GLUCOSE, CAPILLARY: Glucose-Capillary: 166 mg/dL — ABNORMAL HIGH (ref 70–99)

## 2023-03-09 NOTE — Progress Notes (Signed)
Daily Session Note  Patient Details  Name: Sheila Keller MRN: [id number] Date of Birth: 1946-04-14 Referring Provider:   Flowsheet Row CARDIAC REHAB PHASE II ORIENTATION from 12/16/2022 in [name]  Referring Provider Dr. Tenny Craw       Encounter Date: 03/09/2023  Check In:  Session Check In - 03/09/23 0928       Check-In   Supervising physician immediately available to respond to emergencies [name] MD immediately available    Physician(s) Dr Dellia Cloud    Location [name]    Staff Present Leana Roe, BS, Exercise Physiologist;[place] Sherrie George, MS, ACSM-CEP;Melven Sartorius BSN, RN    Virtual Visit No    Medication changes reported     No    Fall or balance concerns reported    No    Tobacco Cessation No Change    Warm-up and Cool-down Performed as group-led Higher education careers adviser Performed Yes    VAD Patient? No    PAD/SET Patient? No      Pain Assessment   Currently in Pain? No/denies    Pain Score 0-No pain    Multiple Pain Sites No             Capillary Blood Glucose: No results found for this or any previous visit (from the past 24 hour(s)).    Social History   Tobacco Use  Smoking Status Never  Smokeless Tobacco Never    Goals Met:  Independence with exercise equipment Exercise tolerated well No report of concerns or symptoms today Strength training completed today  Goals Unmet:  Not Applicable  Comments: check out at 10:30   Dr. Carlyle Dolly is Medical Director for [name]

## 2023-03-09 NOTE — Progress Notes (Signed)
Discharge Progress Report  Patient Details  Name: Sheila Keller MRN: [id number] Date of Birth: [birth year] Referring Provider:   Flowsheet Row CARDIAC REHAB PHASE II ORIENTATION from 12/16/2022 in [place]  Referring Provider Dr. Tenny Craw        Number of Visits: 36  Reason for Discharge:  Patient reached a stable level of exercise. Patient independent in their exercise. Patient has met program and personal goals.  Smoking History:  Social History   Tobacco Use  Smoking Status Never  Smokeless Tobacco Never    Diagnosis:  S/P CABG x 3  ADL UCSD:   Initial Exercise Prescription:  Initial Exercise Prescription - 12/16/22 1300       Date of Initial Exercise RX and Referring Provider   Date 12/16/22    Referring Provider Dr. Tenny Craw    Expected Discharge Date 03/11/23      NuStep   Level 1    SPM 80    Minutes 22      Arm Ergometer   Level 1    RPM 45    Minutes 17      Prescription Details   Frequency (times per week) 3    Duration Progress to 30 minutes of continuous aerobic without signs/symptoms of physical distress      Intensity   THRR 40-80% of Max Heartrate 58-115    Ratings of Perceived Exertion 11-13    Perceived Dyspnea 0-4      Resistance Training   Training Prescription Yes    Weight 3    Reps 10-15             Discharge Exercise Prescription (Final Exercise Prescription Changes):  Exercise Prescription Changes - 02/28/23 1000       Response to Exercise   Blood Pressure (Admit) 116/64    Blood Pressure (Exercise) 106/62    Blood Pressure (Exit) 102/64    Heart Rate (Admit) 99 bpm    Heart Rate (Exercise) 115 bpm    Heart Rate (Exit) 90 bpm    Rating of Perceived Exertion (Exercise) 12    Duration Continue with 30 min of aerobic exercise without signs/symptoms of physical distress.    Intensity THRR unchanged      Progression   Progression Continue to progress workloads to maintain intensity without  signs/symptoms of physical distress.      Resistance Training   Training Prescription Yes    Weight 2    Reps 10-15    Time 10 Minutes      NuStep   Level 3    SPM 99    Minutes 39    METs 3.2             Functional Capacity:  6 Minute Walk     Row Name 12/16/22 1306 03/09/23 1038       6 Minute Walk   Phase Initial Discharge    Distance 1100 feet 1350 feet    Walk Time 6 minutes 6 minutes    # of Rest Breaks 0 0    MPH 2.08 2.55    METS 2.83 2.48    RPE 12 11    VO2 Peak 9.92 8.7    Symptoms No No    Resting HR 84 bpm 81 bpm    Resting BP 92/60 108/62    Resting Oxygen Saturation  97 % 98 %    Exercise Oxygen Saturation  during 6 min walk 97 % 98 %    Max  Ex. HR 98 bpm 100 bpm    Max Ex. BP 110/60 116/67    2 Minute Post BP 106/60 100/60             Psychological, QOL, Others - Outcomes: PHQ 2/9:    03/09/2023   11:32 AM 02/17/2023    8:11 AM 12/16/2022    1:02 PM  Depression screen PHQ 2/9  Decreased Interest 0 0 0  Down, Depressed, Hopeless 0 0 0  PHQ - 2 Score 0 0 0  Altered sleeping 0  0  Tired, decreased energy 0  0  Change in appetite 0  0  Feeling bad or failure about yourself  0  0  Trouble concentrating 0  0  Moving slowly or fidgety/restless 0  0  Suicidal thoughts 0  0  PHQ-9 Score 0  0  Difficult doing work/chores   Not difficult at all    Quality of Life:  Quality of Life - 03/09/23 1039       Quality of Life   Select Quality of Life      Quality of Life Scores   Health/Function Pre 21.6 %    Health/Function Post 27 %    Health/Function % Change 25 %    Socioeconomic Pre 19.5 %    Socioeconomic Post 26 %    Socioeconomic % Change  33.33 %    Psych/Spiritual Pre 24 %    Psych/Spiritual Post 30 %    Psych/Spiritual % Change 25 %    Family Pre 18 %    Family Post 25.5 %    Family % Change 41.67 %    GLOBAL Pre 21.23 %    GLOBAL Post 27.29 %    GLOBAL % Change 28.54 %             Personal Goals: Goals  established at orientation with interventions provided to work toward goal.  Personal Goals and Risk Factors at Admission - 12/16/22 1316       Core Components/Risk Factors/Patient Goals on Admission   Improve shortness of breath with ADL's Yes    Intervention Provide education, individualized exercise plan and daily activity instruction to help decrease symptoms of SOB with activities of daily living.    Expected Outcomes Short Term: Improve cardiorespiratory fitness to achieve a reduction of symptoms when performing ADLs;Long Term: Be able to perform more ADLs without symptoms or delay the onset of symptoms    Diabetes Yes    Intervention Provide education about signs/symptoms and action to take for hypo/hyperglycemia.;Provide education about proper nutrition, including hydration, and aerobic/resistive exercise prescription along with prescribed medications to achieve blood glucose in normal ranges: Fasting glucose 65-99 mg/dL    Expected Outcomes Short Term: Participant verbalizes understanding of the signs/symptoms and immediate care of hyper/hypoglycemia, proper foot care and importance of medication, aerobic/resistive exercise and nutrition plan for blood glucose control.;Long Term: Attainment of HbA1C < 7%.    Hypertension Yes    Intervention Provide education on lifestyle modifcations including regular physical activity/exercise, weight management, moderate sodium restriction and increased consumption of fresh fruit, vegetables, and low fat dairy, alcohol moderation, and smoking cessation.;Monitor prescription use compliance.    Expected Outcomes Short Term: Continued assessment and intervention until BP is < 140/84mm HG in hypertensive participants. < 130/58mm HG in hypertensive participants with diabetes, heart failure or chronic kidney disease.;Long Term: Maintenance of blood pressure at goal levels.    Lipids Yes    Intervention Provide education and support for  participant on nutrition &  aerobic/resistive exercise along with prescribed medications to achieve LDL 70mg , HDL >40mg .    Expected Outcomes Short Term: Participant states understanding of desired cholesterol values and is compliant with medications prescribed. Participant is following exercise prescription and nutrition guidelines.;Long Term: Cholesterol controlled with medications as prescribed, with individualized exercise RX and with personalized nutrition plan. Value goals: LDL < 70mg , HDL > 40 mg.              Personal Goals Discharge:  Goals and Risk Factor Review     Row Name 12/27/22 1021 01/24/23 1026 02/21/23 1029 03/09/23 1137       Core Components/Risk Factors/Patient Goals Review   Personal Goals Review Improve shortness of breath with ADL's;Diabetes;Lipids;Hypertension;Other Improve shortness of breath with ADL's;Diabetes;Lipids;Hypertension;Other Improve shortness of breath with ADL's;Diabetes;Lipids;Hypertension;Other Improve shortness of breath with ADL's;Diabetes;Lipids;Hypertension;Other    Review Patient was referred to CR with CABGx3. She has multiple risk factors for CAD and is participating in the program for risk modification. She has completed 4 sessions. Her current weight is 154.8 lbs up 0.2 bls from her orientation weight. She is doing well in the program. Her DM is managed with [place], Vania Rea, and Metformin. Her last A1C on file was 09/16/22 at 7.0%. Her blood pressure is well controlled. Her personal goals for the program are to get stronger; get her heart healthier; and return to her normal ADL's. We will continue to monitor her progress as she works towards meeting these goals. Patient has completed 16 sessions. Her current weight is 152.6 lbs down 2.2 lbs since last 30 day review. She continues to do well in the program with consistent attendance and progressions. She saw Dr. Gardiner Rhyme 2/8 for routine follow up. No changes made. She continues to have aortic stenosis with plan to do a TAVR  later when the AS progresses to severe. Her DM continues to be managed with Sherren Mocha, and Metformin. Her last A1C on file was 09/16/22 at 7.0%. Her blood pressure is at goal. Her personal goals for the program continue to be to get stronger; get her heart healthier; and return to her normal ADL's. We will continue to monitor her progress as she works towards meeting these goals. Patient has completed 28 sessions. Her current weight is 159.2 lbs gaining 6.6 lbs since last 30 day review. She continues to do well in the program with consistent attendance and progressions. She saw Jearld Fenton for a nutrition consult 3/14. She said it was very informative and she learned a lot and plans to try and modify her diet some. Her DM continues to be managed with Sherren Mocha, and Metformin. Her last A1C on file was 09/16/22 at 7.0%. Her blood pressure continues to be at goal. Her personal goals for the program continue to be to get stronger; get her heart healthier; and return to her normal ADL's. We will continue to monitor her progress as she works towards meeting these goals. Pt graduated from CR after 26 sessions. She gained 3.7 lbs while int he program. Her DM continues to be managed with Sherren Mocha, and Metformin. Her last A1C on file was 7.0% on 09/16/2022. On 02/17/2023, she saw Jearld Fenton for a nutrition consult, and she stated that she learned a lot. However, she does frequently talking about eating out at places like [employer]. It is unclear how much of Penny's teachings that she has included in her diet. Her BP and HR were WNLs while she was in the  program. She reports feeling much stronger now and that she has returned to all of her normal ADLs.    Expected Outcomes Patient will complete the program meeting both personal and program goals. Patient will complete the program meeting both personal and program goals. Patient will complete the program meeting both personal and  program goals. Pt will continue to work towards their goals post discharge.             Exercise Goals and Review:  Exercise Goals     Row Name 12/16/22 1313 01/03/23 1545 01/31/23 1039 02/28/23 1054       Exercise Goals   Increase Physical Activity Yes Yes Yes Yes    Intervention Provide advice, education, support and counseling about physical activity/exercise needs.;Develop an individualized exercise prescription for aerobic and resistive training based on initial evaluation findings, risk stratification, comorbidities and participant's personal goals. Provide advice, education, support and counseling about physical activity/exercise needs.;Develop an individualized exercise prescription for aerobic and resistive training based on initial evaluation findings, risk stratification, comorbidities and participant's personal goals. Provide advice, education, support and counseling about physical activity/exercise needs.;Develop an individualized exercise prescription for aerobic and resistive training based on initial evaluation findings, risk stratification, comorbidities and participant's personal goals. Provide advice, education, support and counseling about physical activity/exercise needs.;Develop an individualized exercise prescription for aerobic and resistive training based on initial evaluation findings, risk stratification, comorbidities and participant's personal goals.    Expected Outcomes Short Term: Attend rehab on a regular basis to increase amount of physical activity.;Long Term: Add in home exercise to make exercise part of routine and to increase amount of physical activity.;Long Term: Exercising regularly at least 3-5 days a week. Short Term: Attend rehab on a regular basis to increase amount of physical activity.;Long Term: Add in home exercise to make exercise part of routine and to increase amount of physical activity.;Long Term: Exercising regularly at least 3-5 days a week.  Short Term: Attend rehab on a regular basis to increase amount of physical activity.;Long Term: Add in home exercise to make exercise part of routine and to increase amount of physical activity.;Long Term: Exercising regularly at least 3-5 days a week. Short Term: Attend rehab on a regular basis to increase amount of physical activity.;Long Term: Add in home exercise to make exercise part of routine and to increase amount of physical activity.;Long Term: Exercising regularly at least 3-5 days a week.    Increase Strength and Stamina Yes Yes Yes Yes    Intervention Provide advice, education, support and counseling about physical activity/exercise needs.;Develop an individualized exercise prescription for aerobic and resistive training based on initial evaluation findings, risk stratification, comorbidities and participant's personal goals. Provide advice, education, support and counseling about physical activity/exercise needs.;Develop an individualized exercise prescription for aerobic and resistive training based on initial evaluation findings, risk stratification, comorbidities and participant's personal goals. Provide advice, education, support and counseling about physical activity/exercise needs.;Develop an individualized exercise prescription for aerobic and resistive training based on initial evaluation findings, risk stratification, comorbidities and participant's personal goals. Provide advice, education, support and counseling about physical activity/exercise needs.;Develop an individualized exercise prescription for aerobic and resistive training based on initial evaluation findings, risk stratification, comorbidities and participant's personal goals.    Expected Outcomes Short Term: Increase workloads from initial exercise prescription for resistance, speed, and METs.;Short Term: Perform resistance training exercises routinely during rehab and add in resistance training at home;Long Term: Improve  cardiorespiratory fitness, muscular endurance and strength as measured by increased  METs and functional capacity (6MWT) Short Term: Increase workloads from initial exercise prescription for resistance, speed, and METs.;Short Term: Perform resistance training exercises routinely during rehab and add in resistance training at home;Long Term: Improve cardiorespiratory fitness, muscular endurance and strength as measured by increased METs and functional capacity (6MWT) Short Term: Increase workloads from initial exercise prescription for resistance, speed, and METs.;Short Term: Perform resistance training exercises routinely during rehab and add in resistance training at home;Long Term: Improve cardiorespiratory fitness, muscular endurance and strength as measured by increased METs and functional capacity (6MWT) Short Term: Increase workloads from initial exercise prescription for resistance, speed, and METs.;Short Term: Perform resistance training exercises routinely during rehab and add in resistance training at home;Long Term: Improve cardiorespiratory fitness, muscular endurance and strength as measured by increased METs and functional capacity (6MWT)    Able to understand and use rate of perceived exertion (RPE) scale Yes Yes Yes Yes    Intervention Provide education and explanation on how to use RPE scale Provide education and explanation on how to use RPE scale Provide education and explanation on how to use RPE scale Provide education and explanation on how to use RPE scale    Expected Outcomes Short Term: Able to use RPE daily in rehab to express subjective intensity level;Long Term:  Able to use RPE to guide intensity level when exercising independently Short Term: Able to use RPE daily in rehab to express subjective intensity level;Long Term:  Able to use RPE to guide intensity level when exercising independently Short Term: Able to use RPE daily in rehab to express subjective intensity level;Long Term:   Able to use RPE to guide intensity level when exercising independently Short Term: Able to use RPE daily in rehab to express subjective intensity level;Long Term:  Able to use RPE to guide intensity level when exercising independently    Knowledge and understanding of Target Heart Rate Range (THRR) Yes Yes Yes Yes    Intervention Provide education and explanation of THRR including how the numbers were predicted and where they are located for reference Provide education and explanation of THRR including how the numbers were predicted and where they are located for reference Provide education and explanation of THRR including how the numbers were predicted and where they are located for reference Provide education and explanation of THRR including how the numbers were predicted and where they are located for reference    Expected Outcomes Long Term: Able to use THRR to govern intensity when exercising independently;Short Term: Able to state/look up THRR;Short Term: Able to use daily as guideline for intensity in rehab Long Term: Able to use THRR to govern intensity when exercising independently;Short Term: Able to state/look up THRR;Short Term: Able to use daily as guideline for intensity in rehab Long Term: Able to use THRR to govern intensity when exercising independently;Short Term: Able to state/look up THRR;Short Term: Able to use daily as guideline for intensity in rehab Long Term: Able to use THRR to govern intensity when exercising independently;Short Term: Able to state/look up THRR;Short Term: Able to use daily as guideline for intensity in rehab    Able to check pulse independently Yes Yes Yes Yes    Intervention Provide education and demonstration on how to check pulse in carotid and radial arteries.;Review the importance of being able to check your own pulse for safety during independent exercise Provide education and demonstration on how to check pulse in carotid and radial arteries.;Review the  importance of being able to check  your own pulse for safety during independent exercise Provide education and demonstration on how to check pulse in carotid and radial arteries.;Review the importance of being able to check your own pulse for safety during independent exercise Provide education and demonstration on how to check pulse in carotid and radial arteries.;Review the importance of being able to check your own pulse for safety during independent exercise    Expected Outcomes Short Term: Able to explain why pulse checking is important during independent exercise;Long Term: Able to check pulse independently and accurately Short Term: Able to explain why pulse checking is important during independent exercise;Long Term: Able to check pulse independently and accurately Short Term: Able to explain why pulse checking is important during independent exercise;Long Term: Able to check pulse independently and accurately Short Term: Able to explain why pulse checking is important during independent exercise;Long Term: Able to check pulse independently and accurately    Understanding of Exercise Prescription Yes Yes Yes Yes    Intervention Provide education, explanation, and written materials on patient's individual exercise prescription Provide education, explanation, and written materials on patient's individual exercise prescription Provide education, explanation, and written materials on patient's individual exercise prescription Provide education, explanation, and written materials on patient's individual exercise prescription    Expected Outcomes Short Term: Able to explain program exercise prescription;Long Term: Able to explain home exercise prescription to exercise independently Short Term: Able to explain program exercise prescription;Long Term: Able to explain home exercise prescription to exercise independently Short Term: Able to explain program exercise prescription;Long Term: Able to explain home  exercise prescription to exercise independently Short Term: Able to explain program exercise prescription;Long Term: Able to explain home exercise prescription to exercise independently             Exercise Goals Re-Evaluation:  Exercise Goals Re-Evaluation     Row Name 01/03/23 1545 01/31/23 1039 02/28/23 1054         Exercise Goal Re-Evaluation   Exercise Goals Review Increase Physical Activity;Able to understand and use rate of perceived exertion (RPE) scale;Increase Strength and Stamina;Knowledge and understanding of Target Heart Rate Range (THRR);Able to check pulse independently;Understanding of Exercise Prescription Increase Strength and Stamina;Increase Physical Activity;Able to understand and use rate of perceived exertion (RPE) scale;Knowledge and understanding of Target Heart Rate Range (THRR);Able to check pulse independently;Understanding of Exercise Prescription Increase Strength and Stamina;Increase Physical Activity;Able to understand and use rate of perceived exertion (RPE) scale;Knowledge and understanding of Target Heart Rate Range (THRR);Able to check pulse independently;Understanding of Exercise Prescription     Comments Pt has comepleted 8 sessions of CR. She stated that when using the arms on the stepper she felt it was pulling at her chest so we lowered the arms and told her to just use her legs. She just started using the arms again the past session and feels fine. She has increased her workload each class by increasing her SPM. She is currently exercising at 2.63 METs on the stepper. Will continue to monitor and progress as able. Pt has completed 20 sessions of cardiac rehab. She is improving since she started the program and is egar to increase her workload. She continues to use her arms on the stepper and no longer states that is feels like her chest was pulling. She ask questions throughout the week about the stepper and is egar to be informed. She is currently  exercising at 2.84 METs on the stepper. Will continue to monitor and progress as able. Pt has completed 32 sessions  of CR. She continues to progress with her workloads and enjoys her sessions. Her chest continues to get stronger and she no longer has any complaints about the stepper on her chest. She continues to push herself in rehab. She is currently exercising at 3.20 METs on the stepper. She will be graduating the program on 4/5, so we will discuss with her what her exercise plans are post CR. Will continue to monitor and progress as able.     Expected Outcomes Through exercise at rehab and home, the patient will meet their stated goals Through exercise at rehab and home, the patient will meet their stated goals Through exercise at rehab and home, the patient will meet their stated goals              Nutrition & Weight - Outcomes:  Pre Biometrics - 12/16/22 1314       Pre Biometrics   Height 5' 3.5" (1.613 m)    Weight 154 lb 1.6 oz (69.9 kg)    Waist Circumference 38 inches    Hip Circumference 40 inches    Waist to Hip Ratio 0.95 %    BMI (Calculated) 26.87    Triceps Skinfold 18 mm    % Body Fat 38 %    Grip Strength 12.6 kg    Flexibility 0 in    Single Leg Stand 0 seconds              Nutrition:  Nutrition Therapy & Goals - 12/27/22 1026       Personal Nutrition Goals   Comments Patient scored 43 on her diet assessment. We offer 2 educational sessions on heart healthy nutrition with handouts and assistance with RD referral if patient is interested.      Intervention Plan   Intervention Nutrition handout(s) given to patient.    Expected Outcomes Short Term Goal: Understand basic principles of dietary content, such as calories, fat, sodium, cholesterol and nutrients.             Nutrition Discharge:  Nutrition Assessments - 03/09/23 1132       MEDFICTS Scores   Pre Score 43    Post Score 38    Score Difference -5             Education  Questionnaire Score:  Knowledge Questionnaire Score - 03/09/23 1130       Knowledge Questionnaire Score   Pre Score 10/24    Post Score 19/24             Goals reviewed with patient; copy given to patient. Pt graduated from CR after 36 sessions. She was able to improve her walk test distance by 22.73%, and her MET level at discharge was 3.53. She states that she will continue to exercise by getting a membership at [employer].

## 2023-03-11 ENCOUNTER — Encounter (HOSPITAL_COMMUNITY): Payer: Medicare Other

## 2023-03-21 DIAGNOSIS — Z951 Presence of aortocoronary bypass graft: Secondary | ICD-10-CM | POA: Diagnosis not present

## 2023-03-21 DIAGNOSIS — I10 Essential (primary) hypertension: Secondary | ICD-10-CM | POA: Diagnosis not present

## 2023-03-21 DIAGNOSIS — I25119 Atherosclerotic heart disease of native coronary artery with unspecified angina pectoris: Secondary | ICD-10-CM | POA: Diagnosis not present

## 2023-03-21 DIAGNOSIS — E7800 Pure hypercholesterolemia, unspecified: Secondary | ICD-10-CM | POA: Diagnosis not present

## 2023-03-21 DIAGNOSIS — I350 Nonrheumatic aortic (valve) stenosis: Secondary | ICD-10-CM | POA: Diagnosis not present

## 2023-03-22 ENCOUNTER — Ambulatory Visit
Admission: RE | Admit: 2023-03-22 | Discharge: 2023-03-22 | Disposition: A | Discharge status: Home / Self Care | Payer: Medicare Other | Source: Ambulatory Visit | Attending: Family Medicine | Admitting: Family Medicine

## 2023-03-22 DIAGNOSIS — Z1231 Encounter for screening mammogram for malignant neoplasm of breast: Secondary | ICD-10-CM

## 2023-03-24 ENCOUNTER — Encounter: Payer: Medicare Other | Attending: Family Medicine | Admitting: Nutrition

## 2023-03-24 ENCOUNTER — Encounter: Payer: Self-pay | Admitting: Nutrition

## 2023-03-24 VITALS — Ht 63.5 in | Wt 158.0 lb

## 2023-03-24 DIAGNOSIS — E118 Type 2 diabetes mellitus with unspecified complications: Secondary | ICD-10-CM

## 2023-03-24 DIAGNOSIS — I2510 Atherosclerotic heart disease of native coronary artery without angina pectoris: Secondary | ICD-10-CM

## 2023-03-24 DIAGNOSIS — I10 Essential (primary) hypertension: Secondary | ICD-10-CM | POA: Diagnosis not present

## 2023-03-24 DIAGNOSIS — Z951 Presence of aortocoronary bypass graft: Secondary | ICD-10-CM

## 2023-03-24 NOTE — Progress Notes (Signed)
Medical Nutrition Therapy  Appointment Start time:  1400  Appointment End time:  1430   Primary concerns today: Dm Type 2, Heart disease , s/[ CABG x 3 Referral diagnosis: E11.8, I51.9 Preferred learning style: No preference  Learning readiness:   Ready    NUTRITION ASSESSMENT  Changes:you made Has cut out eating after 6 pm. Has been getting up earlier and eating breakfast and not skipping meals. Drinking water and trying to get more exercise. Does group exercise twice a week. She is having work done on her house and she can't walk in her yard or clean up til they are finished. Has been eating more fruits and vegetables. Working on cutting out sweets. BS have improved. FBS 82-140 mg/dl.  Still on Metformin 1000 mg BID, Jardiance, Januvia. PCP was going to put her on GLipizide also, but patient wants to make changes with her diet and activity to improve blood sugars instead.  She is applying Lifestyle Medicine and is making better food choices with eating more foods from a garden. Has cut out a lot of processed and high fat, high salt and sugary foods. She is willing to work on lifestyle medicine to help improve her DM and reduce complications from her chronic conditions.   Anthropometrics   Wt Readings from Last 3 Encounters:  03/24/23 158 lb (71.7 kg)  02/28/23 158 lb 11.7 oz (72 kg)  02/17/23 151 lb (68.5 kg)   Ht Readings from Last 3 Encounters:  03/24/23 5' 3.5" (1.613 m)  02/17/23 5' 3.5" (1.613 m)  01/13/23 5' 3.5" (1.613 m)   Body mass index is 27.55 kg/m. @ Facility age limit for growth %iles is 20 years. Facility age limit for growth %iles is 20 years.     Clinical Medical Hx: CAD, Type 2 DM, HTN, CABG x 3 Medications: Jardiance, Metformin and Januvia. Labs:  A1C had gone up to 7.8%.     Latest Ref Rng & Units 10/13/2022   12:00 PM 09/26/2022    9:59 AM 09/24/2022    1:02 AM  CMP  Glucose 70 - 99 mg/dL 161  096  045   BUN 8 - 27 mg/dL Creatinine 0.57 - 1.00 mg/dL 4.09  8.11  9.14   Sodium 134 - 144 mmol/L 144  139  139   Potassium 3.5 - 5.2 mmol/L 5.2  3.6  3.9   Chloride 96 - 106 mmol/L 106  110  113   CO2 20 - 29 mmol/L Calcium 8.7 - 10.3 mg/dL 9.7  8.5  7.9   Total Protein 6.0 - 8.5 g/dL 6.6     Total Bilirubin 0.0 - 1.2 mg/dL 0.4     Alkaline Phos 44 - 121 IU/L 80     AST 0 - 40 IU/L 21     ALT 0 - 32 IU/L 10     Lipid Panel     Component Value Date/Time   CHOL 110 10/13/2022 1201   TRIG 145 10/13/2022 1201   HDL 43 10/13/2022 1201   CHOLHDL 2.6 10/13/2022 1201   LDLCALC 42 10/13/2022 1201   LABVLDL 25 10/13/2022 1201    Notable Signs/Symptoms: no symtoms  Lifestyle & Dietary Hx LIves by herseft  Estimated daily fluid intake: 16 oz of water Supplements:  Sleep: giid Stress / self-care:  Current average weekly physical activity: CR and walks some  24-Hr Dietary Recall First Meal: Oatmeal and fruit  Lunch more vegetables and fruit, water Dinner: meat and vegetables and fruit Cut out snacks and junk food. Drinking water  Estimated Energy Needs Calories: 1200 Carbohydrate: 135g Protein: 90g Fat: 33g   NUTRITION DIAGNOSIS  NB-1.1 Food and nutrition-related knowledge deficit As related to Diabetes Type 2.  As evidenced by A1C 7.0%.   NUTRITION INTERVENTION  Nutrition education (E-1) on the following topics:  Nutrition and Diabetes education provided on My Plate, CHO counting, meal planning, portion sizes, timing of meals, avoiding snacks between meals unless having a low blood sugar, target ranges for A1C and blood sugars, signs/symptoms and treatment of hyper/hypoglycemia, monitoring blood sugars, taking medications as prescribed, benefits of exercising 30 minutes per day and prevention of complications of DM.  Lifestyle Medicine  - Whole Food, Plant Predominant Nutrition is highly recommended: Eat Plenty of vegetables, Mushrooms, fruits, Legumes, Whole Grains, Nuts, seeds in  lieu of processed meats, processed snacks/pastries red meat, poultry, eggs.    -It is better to avoid simple carbohydrates including: Cakes, Sweet Desserts, Ice Cream, Soda (diet and regular), Sweet Tea, Candies, Chips, Cookies, Store Bought Juices, Alcohol in Excess of  1-2 drinks a day, Lemonade,  Artificial Sweeteners, Doughnuts, Coffee Creamers, "Sugar-free" Products, etc, etc.  This is not a complete list.....  Exercise: If you are able: 30 -60 minutes a day ,4 days a week, or 150 minutes a week.  The longer the better.  Combine stretch, strength, and aerobic activities.  If you were told in the past that you have high risk for cardiovascular diseases, you may seek evaluation by your heart doctor prior to initiating moderate to intense exercise programs.   Handouts Provided Include  Lifestyle Medicine   Learning Style & Readiness for Change Teaching method utilized: Visual & Auditory  Demonstrated degree of understanding via: Teach Back  Barriers to learning/adherence to lifestyle change: none  Goals Established by Pt .Goals  Continue the great job on eating more whole plant based foods of fruits, vegetables, and whole grains. Increase walking as tolerated. Keep drinking water Eat meals on time and avoid snacks unless blood sugar is low Get A1C to 6.5% or below   MONITORING & EVALUATION Dietary intake, weekly physical activity, and blood sugars in 1 month. Recommend to give her 6 months of changes with lifestyle medicine to improve her DM and CVD before adding any further DM Medications.  Next Steps  Patient is to work on following whole plant based lifestyle .Marland Kitchen

## 2023-03-24 NOTE — Patient Instructions (Addendum)
Goals  Continue the great job on eating more whole plant based foods of fruits, vegetables, and whole grains. Increase walking as tolerated. Keep drinking water Eat meals on time and avoid snacks unless blood sugar is low Get A1C to 6.5% or below.

## 2023-04-06 ENCOUNTER — Ambulatory Visit (HOSPITAL_COMMUNITY): Payer: Medicare Other | Attending: Cardiology

## 2023-04-06 DIAGNOSIS — I350 Nonrheumatic aortic (valve) stenosis: Secondary | ICD-10-CM | POA: Diagnosis not present

## 2023-04-06 LAB — ECHOCARDIOGRAM COMPLETE
AR max vel: 1.08 cm2
AV Area VTI: 1.12 cm2
AV Area mean vel: 1.1 cm2
AV Mean grad: 21 mmHg
AV Peak grad: 36.2 mmHg
Ao pk vel: 3.01 m/s
Area-P 1/2: 3.77 cm2
S' Lateral: 2.1 cm

## 2023-04-08 ENCOUNTER — Other Ambulatory Visit: Payer: Self-pay | Admitting: Cardiology

## 2023-04-09 ENCOUNTER — Other Ambulatory Visit: Payer: Self-pay

## 2023-04-09 ENCOUNTER — Emergency Department (HOSPITAL_COMMUNITY)
Admission: EM | Admit: 2023-04-09 | Discharge: 2023-04-10 | Disposition: A | Discharge status: Home / Self Care | Payer: Medicare Other | Attending: Emergency Medicine | Admitting: Emergency Medicine

## 2023-04-09 DIAGNOSIS — M25462 Effusion, left knee: Secondary | ICD-10-CM | POA: Insufficient documentation

## 2023-04-09 DIAGNOSIS — I2510 Atherosclerotic heart disease of native coronary artery without angina pectoris: Secondary | ICD-10-CM | POA: Insufficient documentation

## 2023-04-09 DIAGNOSIS — Z7982 Long term (current) use of aspirin: Secondary | ICD-10-CM | POA: Insufficient documentation

## 2023-04-09 DIAGNOSIS — M7989 Other specified soft tissue disorders: Secondary | ICD-10-CM | POA: Diagnosis not present

## 2023-04-09 DIAGNOSIS — Z7984 Long term (current) use of oral hypoglycemic drugs: Secondary | ICD-10-CM | POA: Diagnosis not present

## 2023-04-09 DIAGNOSIS — I10 Essential (primary) hypertension: Secondary | ICD-10-CM | POA: Insufficient documentation

## 2023-04-09 DIAGNOSIS — Z79899 Other long term (current) drug therapy: Secondary | ICD-10-CM | POA: Insufficient documentation

## 2023-04-09 DIAGNOSIS — E119 Type 2 diabetes mellitus without complications: Secondary | ICD-10-CM | POA: Diagnosis not present

## 2023-04-09 DIAGNOSIS — M25562 Pain in left knee: Secondary | ICD-10-CM | POA: Diagnosis not present

## 2023-04-09 NOTE — ED Triage Notes (Signed)
Pt c/o of left leg pain that began today, denies any injury. States the pain has gotten worse throughout day and is now unable to ambulate

## 2023-04-10 ENCOUNTER — Emergency Department (HOSPITAL_COMMUNITY): Payer: Medicare Other

## 2023-04-10 ENCOUNTER — Ambulatory Visit (HOSPITAL_COMMUNITY)
Admission: RE | Admit: 2023-04-10 | Discharge: 2023-04-10 | Disposition: A | Discharge status: Home / Self Care | Payer: Medicare Other | Source: Ambulatory Visit | Attending: Emergency Medicine | Admitting: Emergency Medicine

## 2023-04-10 ENCOUNTER — Other Ambulatory Visit (HOSPITAL_COMMUNITY): Payer: Self-pay | Admitting: Emergency Medicine

## 2023-04-10 DIAGNOSIS — M79606 Pain in leg, unspecified: Secondary | ICD-10-CM | POA: Diagnosis not present

## 2023-04-10 DIAGNOSIS — M25462 Effusion, left knee: Secondary | ICD-10-CM

## 2023-04-10 DIAGNOSIS — M25562 Pain in left knee: Secondary | ICD-10-CM | POA: Diagnosis not present

## 2023-04-10 DIAGNOSIS — M79605 Pain in left leg: Secondary | ICD-10-CM | POA: Diagnosis not present

## 2023-04-10 MED ORDER — OXYCODONE-ACETAMINOPHEN 5-325 MG PO TABS
1.0000 | ORAL_TABLET | Freq: Once | ORAL | Status: AC
  Administered 2023-04-10: 1 via ORAL
  Filled 2023-04-10: qty 1

## 2023-04-10 MED ORDER — RIVAROXABAN 15 MG PO TABS
15.0000 mg | ORAL_TABLET | Freq: Once | ORAL | Status: AC
  Administered 2023-04-10: 15 mg via ORAL
  Filled 2023-04-10: qty 1

## 2023-04-10 NOTE — ED Provider Notes (Signed)
[place] EMERGENCY DEPARTMENT AT [facility] Provider Note   CSN: [id number] Arrival date & time: 04/09/23  2326     History  Chief Complaint  Patient presents with   Leg Pain    Sheila Keller is a 77 y.o. female.  The history is provided by the patient.  Leg Pain She has history of hypertension, diabetes, hyperlipidemia, coronary artery disease and comes in complaining of pain in her left knee which started this morning and has gotten worse through the day.  It has gotten to the point where she is unable to put any weight on her left leg.  She denies any trauma or unusual activity.  She denies prior knee problems.   Home Medications Prior to Admission medications   Medication Sig Start Date End Date Taking? Authorizing Provider  aspirin EC 325 MG tablet TAKE 1 TABLET BY MOUTH EVERY DAY 02/28/23   Little Ishikawa, MD  atorvastatin (LIPITOR) 80 MG tablet TAKE 1 TABLET BY MOUTH EVERY DAY 04/08/23   Little Ishikawa, MD  Cholecalciferol (VITAMIN D3) 2000 units TABS Take 2,000 Units by mouth every morning.    [provider]  docusate sodium (COLACE) 100 MG capsule Take 100 mg by mouth daily.    [provider]  empagliflozin (JARDIANCE) 25 MG TABS tablet Take 25 mg by mouth daily.    [provider]  furosemide (LASIX) 20 MG tablet Take 0.5 tablets (10 mg total) by mouth daily. Take 1/2 tablet of the 20 mg per day (10 mg po x7 days) Patient not taking: Reported on 12/15/2022 10/14/22   Enter, Waverly Ferrari, MD  metFORMIN (GLUCOPHAGE) 1000 MG tablet Take 1,000 mg by mouth 2 (two) times daily. 10/09/22   [provider]  metFORMIN (GLUCOPHAGE) 500 MG tablet Take 1,000 mg by mouth 2 (two) times daily with a meal. 03/15/16   [provider]  metoprolol tartrate (LOPRESSOR) 25 MG tablet TAKE 1 TABLET BY MOUTH TWICE A DAY 04/08/23   Little Ishikawa, MD  risedronate (ACTONEL) 150 MG tablet PLEASE SEE ATTACHED FOR DETAILED  DIRECTIONS Patient not taking: Reported on 01/13/2023 07/21/22   Patton Salles, MD  sitaGLIPtin (JANUVIA) 100 MG tablet Take 100 mg by mouth daily.    [provider]      Allergies    Bee venom, Farxiga [dapagliflozin], and Penicillins    Review of Systems   Review of Systems  All other systems reviewed and are negative.   Physical Exam Updated Vital Signs BP (!) 156/81 (BP Location: Left Arm)   Pulse 92   Temp 98 F (36.7 C) (Oral)   Resp 19   Ht 5\' 3"  (1.6 m)   Wt 71 kg   LMP  (LMP Unknown)   SpO2 99%   BMI 27.73 kg/m  Physical Exam Vitals and nursing note reviewed.   77 year old female, resting comfortably and in no acute distress. Vital signs are significant for elevated blood pressure. Oxygen saturation is 99%, which is normal. Head is normocephalic and atraumatic. PERRLA, EOMI.  Back is nontender and there is no CVA tenderness. Lungs are clear without rales, wheezes, or rhonchi. Chest is nontender. Heart has regular rate and rhythm without murmur. Abdomen is soft, flat, nontender. Extremities: There is a small effusion present in the left knee with marked tenderness in the popliteal area.  There is no instability on valgus or varus stress.  Anterior drawer sign is negative.  McMurray's test  is negative but there is pain on passive flexion.  Left calf circumference is 1 cm greater than right calf circumference but there is no calf tenderness and negative Homans' sign. Skin is warm and dry without rash. Neurologic: Mental status is normal, cranial nerves are intact, moves all extremities equally.  ED Results / Procedures / Treatments    Radiology DG Knee Complete 4 Views Left  Result Date: 04/10/2023 CLINICAL DATA:  Knee pain, swelling EXAM: LEFT KNEE - COMPLETE 4+ VIEW COMPARISON:  None Available. FINDINGS: No acute bony abnormality. Specifically, no fracture, subluxation, or dislocation. Joint spaces maintained. Small joint effusion. Vascular  calcifications. IMPRESSION: Small joint effusion.  No acute bony abnormality. Electronically Signed   By: Charlett Nose M.D.   On: 04/10/2023 02:39    Procedures Procedures    Medications Ordered in ED Medications  Rivaroxaban (XARELTO) tablet 15 mg (has no administration in time range)  oxyCODONE-acetaminophen (PERCOCET/ROXICET) 5-325 MG per tablet 1 tablet (1 tablet Oral Given 04/10/23 0220)    ED Course/ Medical Decision Making/ A&P                             Medical Decision Making Amount and/or Complexity of Data Reviewed Radiology: ordered.  Risk Prescription drug management.   Left knee pain with exam strongly suggestive of Baker's cyst.  Doubt inflammatory arthritis such as gout or pseudogout.  Low index of suspicion for DVT, but cannot rule that out completely without ultrasound.  I have ordered a knee x-ray and I have ordered a dose of oxycodone-acetaminophen for pain.  X-rays show small effusion but otherwise unremarkable.  This is consistent with my physical exam.  I have independently viewed the images, and agree with the radiologist's interpretation.  I have ordered a dose of rivaroxaban and ordered an outpatient venous ultrasound for the morning.  If negative, she will need to follow-up with her orthopedic physician to consider steroid injection of Baker's cyst.  Final Clinical Impression(s) / ED Diagnoses Final diagnoses:  Pain and swelling of left knee    Rx / DC Orders ED Discharge Orders          Ordered    US Venous Img Lower Unilateral Left        04/10/23 0258              Dione Booze, MD 04/10/23 [id number]

## 2023-04-10 NOTE — Discharge Instructions (Addendum)
Please return in the morning for an ultrasound to make sure you do not have a blood clot in your leg.  If the ultrasound is negative, you will need to follow-up with your orthopedic physician who may consider a steroid injection.  In the meantime, apply ice for 30 minutes at a time, 4 times a day.  You may take acetaminophen as needed for pain.

## 2023-04-11 DIAGNOSIS — M25562 Pain in left knee: Secondary | ICD-10-CM | POA: Diagnosis not present

## 2023-04-13 ENCOUNTER — Telehealth: Payer: Self-pay | Admitting: Cardiology

## 2023-04-13 DIAGNOSIS — I350 Nonrheumatic aortic (valve) stenosis: Secondary | ICD-10-CM

## 2023-04-13 NOTE — Telephone Encounter (Signed)
Patient reports she took 2 metoprolol tartrate 25mg  this morning at 8am instead of 1. Per last visit, BP was 130s/70s and pulse 90s. Advised that her dose is low, med is short-acting and she should be fine. Advised that she check BP/pulse around 10am. Advised would be OK to take 8pm dose -- as med is short acting.   Also provided her echo results while on the phone. Mailed copy of report per request. Repeat echo ordered.

## 2023-04-13 NOTE — Telephone Encounter (Signed)
Pt c/o medication issue:  1. Name of Medication:   metoprolol tartrate (LOPRESSOR) 25 MG tablet    2. How are you currently taking this medication (dosage and times per day)?   3. Are you having a reaction (difficulty breathing--STAT)? no  4. What is your medication issue? Pt states she took two of these tablets this morning and she is scared she has "messed up her heart". Please advise.

## 2023-04-20 DIAGNOSIS — E7800 Pure hypercholesterolemia, unspecified: Secondary | ICD-10-CM | POA: Diagnosis not present

## 2023-04-20 DIAGNOSIS — Z951 Presence of aortocoronary bypass graft: Secondary | ICD-10-CM | POA: Diagnosis not present

## 2023-04-20 DIAGNOSIS — I25119 Atherosclerotic heart disease of native coronary artery with unspecified angina pectoris: Secondary | ICD-10-CM | POA: Diagnosis not present

## 2023-04-20 DIAGNOSIS — I350 Nonrheumatic aortic (valve) stenosis: Secondary | ICD-10-CM | POA: Diagnosis not present

## 2023-04-20 DIAGNOSIS — I10 Essential (primary) hypertension: Secondary | ICD-10-CM | POA: Diagnosis not present

## 2023-04-21 DIAGNOSIS — Z1211 Encounter for screening for malignant neoplasm of colon: Secondary | ICD-10-CM | POA: Diagnosis not present

## 2023-05-17 ENCOUNTER — Encounter: Payer: Medicare Other | Attending: Family Medicine | Admitting: Nutrition

## 2023-05-17 ENCOUNTER — Encounter: Payer: Self-pay | Admitting: Nutrition

## 2023-05-17 VITALS — Ht 63.5 in | Wt 158.8 lb

## 2023-05-17 DIAGNOSIS — E118 Type 2 diabetes mellitus with unspecified complications: Secondary | ICD-10-CM | POA: Diagnosis not present

## 2023-05-17 DIAGNOSIS — I10 Essential (primary) hypertension: Secondary | ICD-10-CM | POA: Diagnosis not present

## 2023-05-17 DIAGNOSIS — Z951 Presence of aortocoronary bypass graft: Secondary | ICD-10-CM | POA: Diagnosis not present

## 2023-05-17 DIAGNOSIS — I2510 Atherosclerotic heart disease of native coronary artery without angina pectoris: Secondary | ICD-10-CM | POA: Insufficient documentation

## 2023-05-17 NOTE — Patient Instructions (Addendum)
Goals  Eat breakfast daily-- Go to Cardiac  rehab program to see about signing up for the extra program. Go to the [facility] to see about joining. Cut out sweets, salty foods Eat more whole plant based foods of fruits, vegetables and whole grains. Keep drinking water- no more gingerale Keep walking every day. Talk to MD about being referred to a therapist.

## 2023-05-17 NOTE — Progress Notes (Signed)
Medical Nutrition Therapy  Appointment Start time:  1000  Appointment End time:  1030  Primary concerns today: Dm Type 2, Heart disease , s/[ CABG x 3 Referral diagnosis: E11.8, I51.9 Preferred learning style: No preference  Learning readiness:   Ready    NUTRITION ASSESSMENT  DM Follow uop PCP: Dr. Wynelle Link. Last A1C 7%. Scheduled to get her next one at next MD visit in a few weeks. Changes:you made; Has gotten off track recently. Has been experience a lot of anxiety and is depressed over losing friends with  medical issues. Feels overwhelmed at times. Is afraid of having certain health issues due to others around her having health issues. Has been craving sweets and salty foods. Has been getting up in the middle of the night to eat sweets or salt on watermelon.  Still walking most days.  Checking BS and they are usually 115-120's in the am. Hasn't been eating as much fruits, vegetables and whole grains. Willing to get back on track.  She is willing to work on lifestyle medicine to help improve her DM and reduce complications from her chronic conditions.   Anthropometrics   Wt Readings from Last 3 Encounters:  04/09/23 156 lb 8.4 oz (71 kg)  03/24/23 158 lb (71.7 kg)  02/28/23 158 lb 11.7 oz (72 kg)   Ht Readings from Last 3 Encounters:  04/09/23 5\' 3"  (1.6 m)  03/24/23 5' 3.5" (1.613 m)  02/17/23 5' 3.5" (1.613 m)   There is no height or weight on file to calculate BMI. @BMIFA @ Facility age limit for growth %iles is 20 years. Facility age limit for growth %iles is 20 years.     Clinical Medical Hx: CAD, Type 2 DM, HTN, CABG x 3 Medications: Jardiance, Metformin and Januvia. Labs:  A1C had gone up to 7.8%.     Latest Ref Rng & Units 10/13/2022   12:00 PM 09/26/2022    9:59 AM 09/24/2022    1:02 AM  CMP  Glucose 70 - 99 mg/dL 161  096  045   BUN 8 - 27 mg/dL 19  18  11    Creatinine 0.57 - 1.00 mg/dL 4.09  8.11  9.14   Sodium 134 - 144 mmol/L 144  139  139   Potassium  3.5 - 5.2 mmol/L 5.2  3.6  3.9   Chloride 96 - 106 mmol/L 106  110  113   CO2 20 - 29 mmol/L 20  20  20    Calcium 8.7 - 10.3 mg/dL 9.7  8.5  7.9   Total Protein 6.0 - 8.5 g/dL 6.6     Total Bilirubin 0.0 - 1.2 mg/dL 0.4     Alkaline Phos 44 - 121 IU/L 80     AST 0 - 40 IU/L 21     ALT 0 - 32 IU/L 10     Lipid Panel     Component Value Date/Time   CHOL 110 10/13/2022 1201   TRIG 145 10/13/2022 1201   HDL 43 10/13/2022 1201   CHOLHDL 2.6 10/13/2022 1201   LDLCALC 42 10/13/2022 1201   LABVLDL 25 10/13/2022 1201    Notable Signs/Symptoms: no symtoms  Lifestyle & Dietary Hx LIves by herseft  Estimated daily fluid intake: 16 oz of water Supplements:  Sleep: giid Stress / self-care:  Current average weekly physical activity: CR and walks some  24-Hr Dietary Recall First Meal: Oatmeal and fruit Lunch more vegetables and fruit, water Dinner: meat and vegetables and fruit Sweets or  fruit, craving salty and sweet foods. Drinking water, gingerale  Estimated Energy Needs Calories: 1200 Carbohydrate: 135g Protein: 90g Fat: 33g   NUTRITION DIAGNOSIS  NB-1.1 Food and nutrition-related knowledge deficit As related to Diabetes Type 2.  As evidenced by A1C 7.0%.   NUTRITION INTERVENTION  Nutrition education (E-1) on the following topics:  Nutrition and Diabetes education provided on My Plate, CHO counting, meal planning, portion sizes, timing of meals, avoiding snacks between meals unless having a low blood sugar, target ranges for A1C and blood sugars, signs/symptoms and treatment of hyper/hypoglycemia, monitoring blood sugars, taking medications as prescribed, benefits of exercising 30 minutes per day and prevention of complications of DM.  Lifestyle Medicine  - Whole Food, Plant Predominant Nutrition is highly recommended: Eat Plenty of vegetables, Mushrooms, fruits, Legumes, Whole Grains, Nuts, seeds in lieu of processed meats, processed snacks/pastries red meat, poultry,  eggs.    -It is better to avoid simple carbohydrates including: Cakes, Sweet Desserts, Ice Cream, Soda (diet and regular), Sweet Tea, Candies, Chips, Cookies, Store Bought Juices, Alcohol in Excess of  1-2 drinks a day, Lemonade,  Artificial Sweeteners, Doughnuts, Coffee Creamers, "Sugar-free" Products, etc, etc.  This is not a complete list.....  Exercise: If you are able: 30 -60 minutes a day ,4 days a week, or 150 minutes a week.  The longer the better.  Combine stretch, strength, and aerobic activities.  If you were told in the past that you have high risk for cardiovascular diseases, you may seek evaluation by your heart doctor prior to initiating moderate to intense exercise programs.   Handouts Provided Include  Lifestyle Medicine   Learning Style & Readiness for Change Teaching method utilized: Visual & Auditory  Demonstrated degree of understanding via: Teach Back  Barriers to learning/adherence to lifestyle change: none  Goals Established by Pt   Eat breakfast daily-- Go to Cardiac  rehab program to see about signing up for the extra program. Go to the [facility] to see about joining. Cut out sweets, salty foods Eat more whole plant based foods of fruits, vegetables and whole grains. Keep drinking water- no more gingerale Keep walking every day. Talk to MD about being referred to a therapist.   MONITORING & EVALUATION Dietary intake, weekly physical activity, and blood sugars in 1 month. Recommend to give her 6 months of changes with lifestyle medicine to improve her DM and CVD before adding any further DM Medications.  Next Steps  Patient is to work on following whole plant based lifestyle .Marland Kitchen

## 2023-05-21 DIAGNOSIS — I25119 Atherosclerotic heart disease of native coronary artery with unspecified angina pectoris: Secondary | ICD-10-CM | POA: Diagnosis not present

## 2023-05-21 DIAGNOSIS — I10 Essential (primary) hypertension: Secondary | ICD-10-CM | POA: Diagnosis not present

## 2023-05-21 DIAGNOSIS — I350 Nonrheumatic aortic (valve) stenosis: Secondary | ICD-10-CM | POA: Diagnosis not present

## 2023-05-21 DIAGNOSIS — E7800 Pure hypercholesterolemia, unspecified: Secondary | ICD-10-CM | POA: Diagnosis not present

## 2023-05-21 DIAGNOSIS — Z951 Presence of aortocoronary bypass graft: Secondary | ICD-10-CM | POA: Diagnosis not present

## 2023-05-24 DIAGNOSIS — H43811 Vitreous degeneration, right eye: Secondary | ICD-10-CM | POA: Diagnosis not present

## 2023-05-24 DIAGNOSIS — H26491 Other secondary cataract, right eye: Secondary | ICD-10-CM | POA: Diagnosis not present

## 2023-05-24 DIAGNOSIS — H04123 Dry eye syndrome of bilateral lacrimal glands: Secondary | ICD-10-CM | POA: Diagnosis not present

## 2023-05-24 DIAGNOSIS — Z961 Presence of intraocular lens: Secondary | ICD-10-CM | POA: Diagnosis not present

## 2023-05-31 DIAGNOSIS — L989 Disorder of the skin and subcutaneous tissue, unspecified: Secondary | ICD-10-CM | POA: Diagnosis not present

## 2023-05-31 DIAGNOSIS — E1165 Type 2 diabetes mellitus with hyperglycemia: Secondary | ICD-10-CM | POA: Diagnosis not present

## 2023-06-20 DIAGNOSIS — I25119 Atherosclerotic heart disease of native coronary artery with unspecified angina pectoris: Secondary | ICD-10-CM | POA: Diagnosis not present

## 2023-06-20 DIAGNOSIS — I10 Essential (primary) hypertension: Secondary | ICD-10-CM | POA: Diagnosis not present

## 2023-06-20 DIAGNOSIS — Z951 Presence of aortocoronary bypass graft: Secondary | ICD-10-CM | POA: Diagnosis not present

## 2023-06-20 DIAGNOSIS — E7800 Pure hypercholesterolemia, unspecified: Secondary | ICD-10-CM | POA: Diagnosis not present

## 2023-06-20 DIAGNOSIS — I350 Nonrheumatic aortic (valve) stenosis: Secondary | ICD-10-CM | POA: Diagnosis not present

## 2023-07-21 DIAGNOSIS — Z951 Presence of aortocoronary bypass graft: Secondary | ICD-10-CM | POA: Diagnosis not present

## 2023-07-21 DIAGNOSIS — I350 Nonrheumatic aortic (valve) stenosis: Secondary | ICD-10-CM | POA: Diagnosis not present

## 2023-07-21 DIAGNOSIS — I25119 Atherosclerotic heart disease of native coronary artery with unspecified angina pectoris: Secondary | ICD-10-CM | POA: Diagnosis not present

## 2023-07-21 DIAGNOSIS — I10 Essential (primary) hypertension: Secondary | ICD-10-CM | POA: Diagnosis not present

## 2023-07-21 DIAGNOSIS — E7800 Pure hypercholesterolemia, unspecified: Secondary | ICD-10-CM | POA: Diagnosis not present

## 2023-07-28 NOTE — Progress Notes (Signed)
Cardiology Office Note:    Date:  07/29/2023   ID:  Sheila Keller, DOB [birth year], MRN [id number]  PCP:  Deatra James, MD  Cardiologist:  Little Ishikawa, MD  Electrophysiologist:  None   Referring MD: Deatra James, MD   Chief Complaint  Patient presents with   Coronary Artery Disease    History of Present Illness:    Sheila Keller is a 77 y.o. female with a hx of CAD status post CABG, aortic stenosis, hypertension, diabetes, hyperlipidemia who presents for follow-up. TTE on 03/05/2020 showed hyperdynamic LV systolic function, mild concentric LVH, grade 1 diastolic dysfunction, normal RV function, normal PASP, mild to moderate aortic stenosis.  Echocardiogram 03/17/2021 showed LVEF 70 to 75%, grade 1 diastolic dysfunction, normal RV function, moderate aortic stenosis.  Echocardiogram 03/30/2022 showed EF 70 to 75%, grade 1 diastolic dysfunction, LV mid cavitary gradient 22 mmHg (43 mmHg with Valsalva), normal RV function, moderate aortic stenosis (V-max 3 m/s, mean gradient 24 mmHg, AVA 0.8 cm, DI 0.34).  Cardiac PET scan on 08/24/2022 showed high risk findings, including lateral ischemia, decreased EF with stress, TID, severe three-vessel coronary calcifications, though absolute blood flows were normal.  LHC 09/01/2022 showed 80% distal left main stenosis.  She underwent CABG x3 on 09/20/2022 (LIMA to LAD, SVG-OM1, SVG-OM 2).  Echocardiogram 04/06/2023 showed EF 65 to 70%, grade 1 diastolic dysfunction, normal RV function, moderate aortic stenosis.   Since last clinic vist, she reports she is doing well.  Reports occasional shortness of breath.  Denies any chest pain, lightheadedness, syncope, lower extremity edema, or palpitations.  Works out twice per week 45-60 minutes.  This  Wt Readings from Last 3 Encounters:  07/29/23 161 lb 9.6 oz (73.3 kg)  05/17/23 158 lb 12.8 oz (72 kg)  04/09/23 156 lb 8.4 oz (71 kg)      Past Medical History:  Diagnosis Date   Anxiety    Breast lump  08/2017   [facility]    Chronic cystitis    Diabetes mellitus    type 2   Elevated cholesterol    Heart murmur    Aortic valve stenosis.  Sees cardiologist with [facility], no current problems per patient   Hypertension    Mild dysplasia of cervix (CIN I)    Osteopenia 02/2018   T score -1.8 FRAX 11% / 2.2%    Past Surgical History:  Procedure Laterality Date   BREAST BIOPSY Left    CATARACT EXTRACTION Bilateral    x2    CERVICAL BIOPSY  W/ LOOP ELECTRODE EXCISION  2009   CHOLECYSTECTOMY     colonoscopy     COLPOSCOPY     CORONARY ARTERY BYPASS GRAFT N/A 09/20/2022   Procedure: CORONARY ARTERY BYPASS GRAFTING (CABG) X3 USING ENDOSCOPICALLY HARVESTED RIGHT GREATER SAPHENOUS VEIN;  Surgeon: Lyn Hollingshead, MD;  Location: [facility] OR;  Service: Open Heart Surgery;  Laterality: N/A;   DILATION AND CURETTAGE OF UTERUS     IR THORACENTESIS ASP PLEURAL SPACE W/IMG GUIDE  10/08/2022   LEFT HEART CATH AND CORONARY ANGIOGRAPHY N/A 09/01/2022   Procedure: LEFT HEART CATH AND CORONARY ANGIOGRAPHY;  Surgeon: Lyn Records, MD;  Location: [facility] INVASIVE CV LAB;  Service: Cardiovascular;  Laterality: N/A;   TEE WITHOUT CARDIOVERSION N/A 09/20/2022   Procedure: TRANSESOPHAGEAL ECHOCARDIOGRAM (TEE);  Surgeon: Lyn Hollingshead, MD;  Location: [facility];  Service: Open Heart Surgery;  Laterality: N/A;   TUBAL LIGATION     vocal cord  surgery     nodules excised    Current Medications: Current Meds  Medication Sig   atorvastatin (LIPITOR) 80 MG tablet TAKE 1 TABLET BY MOUTH EVERY DAY   Cholecalciferol (VITAMIN D3) 2000 units TABS Take 2,000 Units by mouth every morning.   docusate sodium (COLACE) 100 MG capsule Take 100 mg by mouth daily.   empagliflozin (JARDIANCE) 25 MG TABS tablet Take 25 mg by mouth daily.   metFORMIN (GLUCOPHAGE) 1000 MG tablet Take 1,000 mg by mouth 2 (two) times daily.   metoprolol tartrate (LOPRESSOR) 25 MG tablet TAKE 1 TABLET BY MOUTH TWICE A DAY    sitaGLIPtin (JANUVIA) 100 MG tablet Take 100 mg by mouth daily.   [DISCONTINUED] aspirin EC 325 MG tablet TAKE 1 TABLET BY MOUTH EVERY DAY     Allergies:   Bee venom, Farxiga [dapagliflozin], and Penicillins   Social History   Socioeconomic History   Marital status: Divorced    Spouse name: Not on file   Number of children: Not on file   Years of education: Not on file   Highest education level: Not on file  Occupational History   Not on file  Tobacco Use   Smoking status: Never   Smokeless tobacco: Never  Vaping Use   Vaping status: Never Used  Substance and Sexual Activity   Alcohol use: No    Alcohol/week: 0.0 standard drinks of alcohol   Drug use: No   Sexual activity: Not Currently    Birth control/protection: Post-menopausal, Surgical    Comment: First sexual encounter at age 17. Less than 5 partners in her life.  Tubal Ligation  Other Topics Concern   Not on file  Social History Narrative   Not on file   Social Determinants of Health   Financial Resource Strain: Not on file  Food Insecurity: Not on file  Transportation Needs: Not on file  Physical Activity: Not on file  Stress: Not on file  Social Connections: Unknown (04/20/2022)   Received from [facility]   Social Network    Social Network: Not on file     Family History: The patient's family history includes Breast cancer in her maternal aunt and mother; Diabetes in her mother and son; Heart attack in her son; Heart disease in her mother; Hypertension in her mother; Lung cancer in her father; Stroke in her mother.  ROS:   Please see the history of present illness.    All other systems reviewed and are negative.  EKGs/Labs/Other Studies Reviewed:    The following studies were reviewed today:   EKG:    07/29/2023: Normal sinus rhythm, incomplete right bundle branch block, rate 98, T wave inversions in V3/4 10/13/2022: Normal sinus rhythm, rate 78, right axis deviation, T wave  inversions in inferior and precordial leads 08/26/2022: Sinus tachycardia, rate 109, no ST abnormalities 04/06/22:  Normal sinus rhythm, rate 101, no ST abnormalities 02/18/2020: normal sinus rhythm, rate 108, right axis deviation, no ST abnormalities, Q waves in III, aVF 04/01/2021: Sinus rhythm. Rate 88 bpm. No ST abnormalities.  Recent Labs: 09/26/2022: Magnesium 1.9 10/13/2022: ALT 10; BUN 19; Creatinine, Ser 0.84; Hemoglobin 9.6; Platelets 364; Potassium 5.2; Sodium 144  Recent Lipid Panel    Component Value Date/Time   CHOL 110 10/13/2022 1201   TRIG 145 10/13/2022 1201   HDL 43 10/13/2022 1201   CHOLHDL 2.6 10/13/2022 1201   LDLCALC 42 10/13/2022 1201    Physical Exam:    VS:  BP 118/80  Pulse 98   Ht 5\' 3"  (1.6 m)   Wt 161 lb 9.6 oz (73.3 kg)   LMP  (LMP Unknown)   SpO2 98%   BMI 28.63 kg/m     Wt Readings from Last 3 Encounters:  07/29/23 161 lb 9.6 oz (73.3 kg)  05/17/23 158 lb 12.8 oz (72 kg)  04/09/23 156 lb 8.4 oz (71 kg)     GEN:  in no acute distress HEENT: Normal NECK: No JVD CARDIAC: RRR, 3/6 systolic heart murmur loudest at the RUSB RESPIRATORY: CTAB ABDOMEN: Soft, non-tender, non-distended MUSCULOSKELETAL:  No edema; No deformity  SKIN: Warm and dry NEUROLOGIC:  Alert and oriented x 3 PSYCHIATRIC:  Normal affect   ASSESSMENT:    1. Coronary artery disease involving native coronary artery of native heart without angina pectoris   2. Aortic valve stenosis, etiology of cardiac valve disease unspecified   3. Essential hypertension   4. Hyperlipidemia, unspecified hyperlipidemia type      PLAN:    CAD: Calcium score 1176 (95th percentile) on 05/25/2022.  Denied any chest pain or dyspnea.  Cardiac PET scan on 08/24/2022 showed high risk findings, including lateral ischemia, decreased EF with stress, TID, severe three-vessel coronary calcifications, though absolute blood flows were normal. LHC 09/01/2022 showed 80% distal left main stenosis.  She  underwent CABG x3 on 09/20/2022 (LIMA to LAD, SVG-OM1, SVG-OM 2). -Continue aspirin  -Continue atorvastatin 80 mg daily -Completed Cardiac rehab  Aortic stenosis: Echocardiogram 03/17/2021 showed moderate aortic stenosis (V-max 3 m/s, mean gradient 20 mmHg, AVA 1.0 cm, DI 0.3).  Echocardiogram 03/30/2022 showed moderate aortic stenosis (V-max 3.0 m/s, mean gradient 24 mmHg, AVA 0.8 cm, DI 0.34).  Dr Delia Chimes elected not to perform AVR at time of CABG, recommended TAVR evaluation when AS progresses to severe.  Repeat echo 04/2023 showed moderate aortic stenosis (V-max 3.0 m/s, mean gradient 21 mmHg, AVA 1.1 cm, DI 0.30)  Hypertension: On metoprolol 25 mg twice daily.  Appears controlled  Hyperlipidemia: On atorvastatin 80 mg daily.  LDL 54 on 02/25/2023  Type 2 diabetes: On Metformin, Januvia, Jardiance.  A1c 8.3% on 05/31/2023   RTC in 6 months    Medication Adjustments/Labs and Tests Ordered: Current medicines are reviewed at length with the patient today.  Concerns regarding medicines are outlined above.  Orders Placed This Encounter  Procedures   EKG 12-Lead   Meds ordered this encounter  Medications   aspirin EC 81 MG tablet    Sig: Take 4 tablets (325 mg total) by mouth daily.    Dispense:  90 tablet    Refill:  3    Patient Instructions  Medication Instructions:  DECREASE aspirin to 81 mg daily  *If you need a refill on your cardiac medications before your next appointment, please call your pharmacy*   Follow-Up: At [facility], you and your health needs are our priority.  As part of our continuing mission to provide you with exceptional heart care, we have created designated Provider Care Teams.  These Care Teams include your primary Cardiologist (physician) and Advanced Practice Providers (APPs -  Physician Assistants and Nurse Practitioners) who all work together to provide you with the care you need, when you need it.    Your next appointment:   6  month(s)  Provider:   Little Ishikawa, MD       Signed, Little Ishikawa, MD  07/29/2023 9:10 AM    [facility]

## 2023-07-29 ENCOUNTER — Ambulatory Visit: Payer: Medicare Other | Attending: Cardiology | Admitting: Cardiology

## 2023-07-29 VITALS — BP 118/80 | HR 98 | Ht 63.0 in | Wt 161.6 lb

## 2023-07-29 DIAGNOSIS — I10 Essential (primary) hypertension: Secondary | ICD-10-CM

## 2023-07-29 DIAGNOSIS — I350 Nonrheumatic aortic (valve) stenosis: Secondary | ICD-10-CM

## 2023-07-29 DIAGNOSIS — E785 Hyperlipidemia, unspecified: Secondary | ICD-10-CM | POA: Diagnosis not present

## 2023-07-29 DIAGNOSIS — I2510 Atherosclerotic heart disease of native coronary artery without angina pectoris: Secondary | ICD-10-CM

## 2023-07-29 MED ORDER — ASPIRIN 81 MG PO TBEC: 325.0000 mg | DELAYED_RELEASE_TABLET | Freq: Every day | ORAL | 3 refills | Status: DC

## 2023-07-29 NOTE — Patient Instructions (Addendum)
Medication Instructions:  DECREASE aspirin to 81 mg daily  *If you need a refill on your cardiac medications before your next appointment, please call your pharmacy*   Follow-Up: At [facility], you and your health needs are our priority.  As part of our continuing mission to provide you with exceptional heart care, we have created designated Provider Care Teams.  These Care Teams include your primary Cardiologist (physician) and Advanced Practice Providers (APPs -  Physician Assistants and Nurse Practitioners) who all work together to provide you with the care you need, when you need it.    Your next appointment:   6 month(s)  Provider:   Little Ishikawa, MD

## 2023-08-01 ENCOUNTER — Telehealth: Payer: Self-pay | Admitting: Cardiology

## 2023-08-01 MED ORDER — ASPIRIN 81 MG PO TBEC: 81.0000 mg | DELAYED_RELEASE_TABLET | Freq: Every day | ORAL | 3 refills | Status: DC

## 2023-08-01 NOTE — Telephone Encounter (Signed)
Pt c/o medication issue:  1. Name of Medication:   aspirin EC 81 MG tablet    2. How are you currently taking this medication (dosage and times per day)?   Take 4 tablets (325 mg total) by mouth daily.    3. Are you having a reaction (difficulty breathing--STAT)? No  4. What is your medication issue? Pt states that at 8/23 visit medication instructions wee changed to, take 1 tablet by mouth daily. Pt needs this change sent to pharmacy on file. Please advise

## 2023-08-01 NOTE — Telephone Encounter (Signed)
Call to patient to clarify what dose and send to pharmacy  Per OV notes. DECREASE aspirin to 81 mg daily . New RX sent as updated

## 2023-08-02 ENCOUNTER — Ambulatory Visit: Payer: Medicare Other | Admitting: Nutrition

## 2023-08-31 DIAGNOSIS — E1165 Type 2 diabetes mellitus with hyperglycemia: Secondary | ICD-10-CM | POA: Diagnosis not present

## 2023-08-31 DIAGNOSIS — I10 Essential (primary) hypertension: Secondary | ICD-10-CM | POA: Diagnosis not present

## 2023-08-31 DIAGNOSIS — E119 Type 2 diabetes mellitus without complications: Secondary | ICD-10-CM | POA: Diagnosis not present

## 2023-08-31 DIAGNOSIS — I350 Nonrheumatic aortic (valve) stenosis: Secondary | ICD-10-CM | POA: Diagnosis not present

## 2023-08-31 DIAGNOSIS — E785 Hyperlipidemia, unspecified: Secondary | ICD-10-CM | POA: Diagnosis not present

## 2023-08-31 DIAGNOSIS — I2510 Atherosclerotic heart disease of native coronary artery without angina pectoris: Secondary | ICD-10-CM | POA: Diagnosis not present

## 2023-08-31 DIAGNOSIS — Z951 Presence of aortocoronary bypass graft: Secondary | ICD-10-CM | POA: Diagnosis not present

## 2023-09-14 ENCOUNTER — Other Ambulatory Visit: Payer: Self-pay | Admitting: Family Medicine

## 2023-09-14 DIAGNOSIS — Z1231 Encounter for screening mammogram for malignant neoplasm of breast: Secondary | ICD-10-CM

## 2023-09-19 DIAGNOSIS — Z23 Encounter for immunization: Secondary | ICD-10-CM | POA: Diagnosis not present

## 2023-11-21 DIAGNOSIS — H6123 Impacted cerumen, bilateral: Secondary | ICD-10-CM | POA: Diagnosis not present

## 2023-12-20 ENCOUNTER — Telehealth: Payer: Self-pay | Admitting: Cardiology

## 2023-12-20 NOTE — Telephone Encounter (Signed)
 Called and spoke to patient. Verified name and DOB. Patient was informed that her ECHO was scheduled for 04/06/24. She has an appointment with Dr Kate for 2/19. Patient would like to know if her ECHO can be scheduled sooner so that he will have for her appointment on 2/19?

## 2023-12-20 NOTE — Telephone Encounter (Signed)
 Patient is calling saying that she was told she needed an echo done before her appt on 2/19

## 2023-12-22 NOTE — Telephone Encounter (Signed)
Her echo should be 1 year from her prior echo, which was in  May 2024.  So would wait until May 2025 for echo

## 2023-12-23 ENCOUNTER — Encounter: Payer: Self-pay | Admitting: *Deleted

## 2023-12-23 NOTE — Telephone Encounter (Signed)
Called and made patient aware ECHO scheduled 04/06/2024. Per Dr. Bjorn Pippin it should be done 1 year from her prior echo which was May 2024. Patient verbalized understanding.

## 2024-01-17 ENCOUNTER — Other Ambulatory Visit: Payer: Self-pay | Admitting: Cardiology

## 2024-01-22 NOTE — Progress Notes (Deleted)
 Cardiology Office Note:    Date:  01/22/2024   ID:  Sheila Keller, DOB 1946/07/08, MRN [id number]  PCP:  Deatra James, MD  Cardiologist:  Little Ishikawa, MD  Electrophysiologist:  None   Referring MD: Deatra James, MD   No chief complaint on file.   History of Present Illness:    Sheila Keller is a 78 y.o. female with a hx of CAD status post CABG, aortic stenosis, hypertension, diabetes, hyperlipidemia who presents for follow-up. TTE on 03/05/2020 showed hyperdynamic LV systolic function, mild concentric LVH, grade 1 diastolic dysfunction, normal RV function, normal PASP, mild to moderate aortic stenosis.  Echocardiogram 03/17/2021 showed LVEF 70 to 75%, grade 1 diastolic dysfunction, normal RV function, moderate aortic stenosis.  Echocardiogram 03/30/2022 showed EF 70 to 75%, grade 1 diastolic dysfunction, LV mid cavitary gradient 22 mmHg (43 mmHg with Valsalva), normal RV function, moderate aortic stenosis (V-max 3 m/s, mean gradient 24 mmHg, AVA 0.8 cm, DI 0.34).  Cardiac PET scan on 08/24/2022 showed high risk findings, including lateral ischemia, decreased EF with stress, TID, severe three-vessel coronary calcifications, though absolute blood flows were normal.  LHC 09/01/2022 showed 80% distal left main stenosis.  She underwent CABG x3 on 09/20/2022 (LIMA to LAD, SVG-OM1, SVG-OM 2).  Echocardiogram 04/06/2023 showed EF 65 to 70%, grade 1 diastolic dysfunction, normal RV function, moderate aortic stenosis.   Since last clinic vist,  she reports she is doing well.  Reports occasional shortness of breath.  Denies any chest pain, lightheadedness, syncope, lower extremity edema, or palpitations.  Works out twice per week 45-60 minutes.  This  Wt Readings from Last 3 Encounters:  07/29/23 161 lb 9.6 oz (73.3 kg)  05/17/23 158 lb 12.8 oz (72 kg)  04/09/23 156 lb 8.4 oz (71 kg)      Past Medical History:  Diagnosis Date   Anxiety    Breast lump 08/2017   no cancer/ [facility]  [place]   Chronic cystitis    Diabetes mellitus    type 2   Elevated cholesterol    Heart murmur    Aortic valve stenosis.  Sees cardiologist with [facility], no current problems per patient   Hypertension    Mild dysplasia of cervix (CIN I)    Osteopenia 02/2018   T score -1.8 FRAX 11% / 2.2%    Past Surgical History:  Procedure Laterality Date   BREAST BIOPSY Left    CATARACT EXTRACTION Bilateral    x2    CERVICAL BIOPSY  W/ LOOP ELECTRODE EXCISION  2009   CHOLECYSTECTOMY     colonoscopy     COLPOSCOPY     CORONARY ARTERY BYPASS GRAFT N/A 09/20/2022   Procedure: CORONARY ARTERY BYPASS GRAFTING (CABG) X3 USING ENDOSCOPICALLY HARVESTED RIGHT GREATER SAPHENOUS VEIN;  Surgeon: Lyn Hollingshead, MD;  Location: [facility] OR;  Service: Open Heart Surgery;  Laterality: N/A;   DILATION AND CURETTAGE OF UTERUS     IR THORACENTESIS ASP PLEURAL SPACE W/IMG GUIDE  10/08/2022   LEFT HEART CATH AND CORONARY ANGIOGRAPHY N/A 09/01/2022   Procedure: LEFT HEART CATH AND CORONARY ANGIOGRAPHY;  Surgeon: Lyn Records, MD;  Location: [facility] INVASIVE CV LAB;  Service: Cardiovascular;  Laterality: N/A;   TEE WITHOUT CARDIOVERSION N/A 09/20/2022   Procedure: TRANSESOPHAGEAL ECHOCARDIOGRAM (TEE);  Surgeon: Lyn Hollingshead, MD;  Location: [facility] OR;  Service: Open Heart Surgery;  Laterality: N/A;   TUBAL LIGATION     vocal cord surgery     nodules  excised    Current Medications: No outpatient medications have been marked as taking for the 01/25/24 encounter (Appointment) with Little Ishikawa, MD.     Allergies:   Bee venom, Marcelline Deist [dapagliflozin], and Penicillins   Social History   Socioeconomic History   Marital status: Divorced    Spouse name: Not on file   Number of children: Not on file   Years of education: Not on file   Highest education level: Not on file  Occupational History   Not on file  Tobacco Use   Smoking status: Never   Smokeless tobacco: Never  Vaping Use   Vaping status: Never  Used  Substance and Sexual Activity   Alcohol use: No    Alcohol/week: 0.0 standard drinks of alcohol   Drug use: No   Sexual activity: Not Currently    Birth control/protection: Post-menopausal, Surgical    Comment: First sexual encounter at age 54. Less than 5 partners in her life.  Tubal Ligation  Other Topics Concern   Not on file  Social History Narrative   Not on file   Social Drivers of Health   Financial Resource Strain: Not on file  Food Insecurity: Low Risk  (11/21/2023)   Received from [facility]   Hunger Vital Sign    Worried About Running Out of Food in the Last Year: Never true    Ran Out of Food in the Last Year: Never true  Transportation Needs: No Transportation Needs (11/21/2023)   Received from [facility]    In the past 12 months, has lack of reliable transportation kept you from medical appointments, meetings, work or from getting things needed for daily living? : No  Physical Activity: Not on file  Stress: Not on file  Social Connections: Unknown (04/20/2022)   Received from [facility]   Social Network    Social Network: Not on file     Family History: The patient's family history includes Breast cancer in her maternal aunt and mother; Diabetes in her mother and son; Heart attack in her son; Heart disease in her mother; Hypertension in her mother; Lung cancer in her father; Stroke in her mother.  ROS:   Please see the history of present illness.    All other systems reviewed and are negative.  EKGs/Labs/Other Studies Reviewed:    The following studies were reviewed today:   EKG:    07/29/2023: Normal sinus rhythm, incomplete right bundle branch block, rate 98, T wave inversions in V3/4 10/13/2022: Normal sinus rhythm, rate 78, right axis deviation, T wave inversions in inferior and precordial leads 08/26/2022: Sinus tachycardia, rate 109, no ST abnormalities 04/06/22:  Normal sinus rhythm, rate 101, no ST  abnormalities 02/18/2020: normal sinus rhythm, rate 108, right axis deviation, no ST abnormalities, Q waves in III, aVF 04/01/2021: Sinus rhythm. Rate 88 bpm. No ST abnormalities.  Recent Labs: No results found for requested labs within last 365 days.  Recent Lipid Panel    Component Value Date/Time   CHOL 110 10/13/2022 1201   TRIG 145 10/13/2022 1201   HDL 43 10/13/2022 1201   CHOLHDL 2.6 10/13/2022 1201   LDLCALC 42 10/13/2022 1201    Physical Exam:    VS:  LMP  (LMP Unknown)     Wt Readings from Last 3 Encounters:  07/29/23 161 lb 9.6 oz (73.3 kg)  05/17/23 158 lb 12.8 oz (72 kg)  04/09/23 156 lb 8.4 oz (71 kg)  GEN:  in no acute distress HEENT: Normal NECK: No JVD CARDIAC: RRR, 3/6 systolic heart murmur loudest at the RUSB RESPIRATORY: CTAB ABDOMEN: Soft, non-tender, non-distended MUSCULOSKELETAL:  No edema; No deformity  SKIN: Warm and dry NEUROLOGIC:  Alert and oriented x 3 PSYCHIATRIC:  Normal affect   ASSESSMENT:    No diagnosis found.    PLAN:    CAD: Calcium score 1176 (95th percentile) on 05/25/2022.  Denied any chest pain or dyspnea.  Cardiac PET scan on 08/24/2022 showed high risk findings, including lateral ischemia, decreased EF with stress, TID, severe three-vessel coronary calcifications, though absolute blood flows were normal. LHC 09/01/2022 showed 80% distal left main stenosis.  She underwent CABG x3 on 09/20/2022 (LIMA to LAD, SVG-OM1, SVG-OM 2). -Continue aspirin  -Continue atorvastatin 80 mg daily  Aortic stenosis: Echocardiogram 03/17/2021 showed moderate aortic stenosis (V-max 3 m/s, mean gradient 20 mmHg, AVA 1.0 cm, DI 0.3).  Echocardiogram 03/30/2022 showed moderate aortic stenosis (V-max 3.0 m/s, mean gradient 24 mmHg, AVA 0.8 cm, DI 0.34).  Dr Delia Chimes elected not to perform AVR at time of CABG, recommended TAVR evaluation when AS progresses to severe.  Repeat echo 04/2023 showed moderate aortic stenosis (V-max 3.0 m/s, mean gradient 21 mmHg,  AVA 1.1 cm, DI 0.30) -Repeat echo 04/2024 for monitoring  Hypertension: On metoprolol 25 mg twice daily.  Appears controlled  Hyperlipidemia: On atorvastatin 80 mg daily.  LDL 54 on 02/25/2023  Type 2 diabetes: On Metformin, Januvia, Jardiance.  A1c 8.3% on 05/31/2023   RTC in 6 months***    Medication Adjustments/Labs and Tests Ordered: Current medicines are reviewed at length with the patient today.  Concerns regarding medicines are outlined above.  No orders of the defined types were placed in this encounter.  No orders of the defined types were placed in this encounter.   There are no Patient Instructions on file for this visit.      Signed, Little Ishikawa, MD  01/22/2024 2:44 PM    [facility]

## 2024-01-25 ENCOUNTER — Ambulatory Visit: Payer: Medicare Other | Admitting: Cardiology

## 2024-02-16 ENCOUNTER — Encounter: Payer: Self-pay | Admitting: Cardiology

## 2024-02-16 ENCOUNTER — Ambulatory Visit: Attending: Cardiology | Admitting: Cardiology

## 2024-02-16 VITALS — BP 144/76 | HR 83 | Ht 63.5 in | Wt 160.0 lb

## 2024-02-16 DIAGNOSIS — I10 Essential (primary) hypertension: Secondary | ICD-10-CM

## 2024-02-16 DIAGNOSIS — I2510 Atherosclerotic heart disease of native coronary artery without angina pectoris: Secondary | ICD-10-CM | POA: Diagnosis not present

## 2024-02-16 DIAGNOSIS — E785 Hyperlipidemia, unspecified: Secondary | ICD-10-CM

## 2024-02-16 DIAGNOSIS — I350 Nonrheumatic aortic (valve) stenosis: Secondary | ICD-10-CM | POA: Diagnosis not present

## 2024-02-16 NOTE — Progress Notes (Signed)
 Cardiology Office Note:    Date:  02/16/2024   ID:  Sheila Keller, DOB 1946/10/02, MRN [id number]  PCP:  Deatra James, MD  Cardiologist:  Little Ishikawa, MD  Electrophysiologist:  None   Referring MD: Deatra James, MD   Chief Complaint  Patient presents with   Coronary Artery Disease    History of Present Illness:    Sheila Keller is a 78 y.o. female with a hx of CAD status post CABG, aortic stenosis, hypertension, diabetes, hyperlipidemia who presents for follow-up. TTE on 03/05/2020 showed hyperdynamic LV systolic function, mild concentric LVH, grade 1 diastolic dysfunction, normal RV function, normal PASP, mild to moderate aortic stenosis.  Echocardiogram 03/17/2021 showed LVEF 70 to 75%, grade 1 diastolic dysfunction, normal RV function, moderate aortic stenosis.  Echocardiogram 03/30/2022 showed EF 70 to 75%, grade 1 diastolic dysfunction, LV mid cavitary gradient 22 mmHg (43 mmHg with Valsalva), normal RV function, moderate aortic stenosis (V-max 3 m/s, mean gradient 24 mmHg, AVA 0.8 cm, DI 0.34).  Cardiac PET scan on 08/24/2022 showed high risk findings, including lateral ischemia, decreased EF with stress, TID, severe three-vessel coronary calcifications, though absolute blood flows were normal.  LHC 09/01/2022 showed 80% distal left main stenosis.  She underwent CABG x3 on 09/20/2022 (LIMA to LAD, SVG-OM1, SVG-OM 2).  Echocardiogram 04/06/2023 showed EF 65 to 70%, grade 1 diastolic dysfunction, normal RV function, moderate aortic stenosis.   Since last clinic vist, she reports has been doing well.  Does report some dyspnea on exertion.  Denies any chest pain, lightheadedness, syncope, lower extremity edema, or palpitations.  Exercises 25 minutes every day.  Reports BP in 120s when she checks at home.   Wt Readings from Last 3 Encounters:  02/16/24 160 lb (72.6 kg)  07/29/23 161 lb 9.6 oz (73.3 kg)  05/17/23 158 lb 12.8 oz (72 kg)   BP Readings from Last 3 Encounters:   02/16/24 (!) 144/76  07/29/23 118/80  04/10/23 (!) 143/81      Past Medical History:  Diagnosis Date   Anxiety    Breast lump 08/2017   no cancer/ [facility]   Chronic cystitis    Diabetes mellitus    type 2   Elevated cholesterol    Heart murmur    Aortic valve stenosis.  Sees cardiologist with [facility], no current problems per patient   Hypertension    Mild dysplasia of cervix (CIN I)    Osteopenia 02/2018   T score -1.8 FRAX 11% / 2.2%    Past Surgical History:  Procedure Laterality Date   BREAST BIOPSY Left    CATARACT EXTRACTION Bilateral    x2    CERVICAL BIOPSY  W/ LOOP ELECTRODE EXCISION  2009   CHOLECYSTECTOMY     colonoscopy     COLPOSCOPY     CORONARY ARTERY BYPASS GRAFT N/A 09/20/2022   Procedure: CORONARY ARTERY BYPASS GRAFTING (CABG) X3 USING ENDOSCOPICALLY HARVESTED RIGHT GREATER SAPHENOUS VEIN;  Surgeon: Lyn Hollingshead, MD;  Location: [facility] OR;  Service: Open Heart Surgery;  Laterality: N/A;   DILATION AND CURETTAGE OF UTERUS     IR THORACENTESIS ASP PLEURAL SPACE W/IMG GUIDE  10/08/2022   LEFT HEART CATH AND CORONARY ANGIOGRAPHY N/A 09/01/2022   Procedure: LEFT HEART CATH AND CORONARY ANGIOGRAPHY;  Surgeon: Lyn Records, MD;  Location: [facility] INVASIVE CV LAB;  Service: Cardiovascular;  Laterality: N/A;   TEE WITHOUT CARDIOVERSION N/A 09/20/2022   Procedure: TRANSESOPHAGEAL ECHOCARDIOGRAM (TEE);  Surgeon: Delia Chimes,  Waverly Ferrari, MD;  Location: [facility] OR;  Service: Open Heart Surgery;  Laterality: N/A;   TUBAL LIGATION     vocal cord surgery     nodules excised    Current Medications: Current Meds  Medication Sig   aspirin EC 81 MG tablet Take 1 tablet (81 mg total) by mouth daily.   atorvastatin (LIPITOR) 80 MG tablet TAKE 1 TABLET BY MOUTH EVERY DAY   Cholecalciferol (VITAMIN D3) 2000 units TABS Take 2,000 Units by mouth every morning.   docusate sodium (COLACE) 100 MG capsule Take 100 mg by mouth daily.   empagliflozin (JARDIANCE) 25 MG TABS  tablet Take 25 mg by mouth daily.   metFORMIN (GLUCOPHAGE) 1000 MG tablet Take 1,000 mg by mouth 2 (two) times daily.   metoprolol tartrate (LOPRESSOR) 25 MG tablet TAKE 1 TABLET BY MOUTH TWICE A DAY   sitaGLIPtin (JANUVIA) 100 MG tablet Take 100 mg by mouth daily.     Allergies:   Bee venom, Farxiga [dapagliflozin], and Penicillins   Social History   Socioeconomic History   Marital status: Divorced    Spouse name: Not on file   Number of children: Not on file   Years of education: Not on file   Highest education level: Not on file  Occupational History   Not on file  Tobacco Use   Smoking status: Never   Smokeless tobacco: Never  Vaping Use   Vaping status: Never Used  Substance and Sexual Activity   Alcohol use: No    Alcohol/week: 0.0 standard drinks of alcohol   Drug use: No   Sexual activity: Not Currently    Birth control/protection: Post-menopausal, Surgical    Comment: First sexual encounter at age 38. Less than 5 partners in her life.  Tubal Ligation  Other Topics Concern   Not on file  Social History Narrative   Not on file   Social Drivers of Health   Financial Resource Strain: Not on file  Food Insecurity: Low Risk  (11/21/2023)   Received from [facility]   Hunger Vital Sign    Worried About Running Out of Food in the Last Year: Never true    Ran Out of Food in the Last Year: Never true  Transportation Needs: No Transportation Needs (11/21/2023)   Received from [facility]    In the past 12 months, has lack of reliable transportation kept you from medical appointments, meetings, work or from getting things needed for daily living? : No  Physical Activity: Not on file  Stress: Not on file  Social Connections: Unknown (04/20/2022)   Received from [facility]   Social Network    Social Network: Not on file     Family History: The patient's family history includes Breast cancer in her maternal aunt and mother;  Diabetes in her mother and son; Heart attack in her son; Heart disease in her mother; Hypertension in her mother; Lung cancer in her father; Stroke in her mother.  ROS:   Please see the history of present illness.    All other systems reviewed and are negative.  EKGs/Labs/Other Studies Reviewed:    The following studies were reviewed today:   EKG:    02/16/2024: Normal sinus rhythm, rate 81, right bundle branch block, left posterior fascicular block, T wave inversions in inferior leads and V1-4 07/29/2023: Normal sinus rhythm, incomplete right bundle branch block, rate 98, T wave inversions in V3/4 10/13/2022: Normal sinus rhythm, rate 78, right  axis deviation, T wave inversions in inferior and precordial leads 08/26/2022: Sinus tachycardia, rate 109, no ST abnormalities 04/06/22:  Normal sinus rhythm, rate 101, no ST abnormalities 02/18/2020: normal sinus rhythm, rate 108, right axis deviation, no ST abnormalities, Q waves in III, aVF 04/01/2021: Sinus rhythm. Rate 88 bpm. No ST abnormalities.  Recent Labs: No results found for requested labs within last 365 days.  Recent Lipid Panel    Component Value Date/Time   CHOL 110 10/13/2022 1201   TRIG 145 10/13/2022 1201   HDL 43 10/13/2022 1201   CHOLHDL 2.6 10/13/2022 1201   LDLCALC 42 10/13/2022 1201    Physical Exam:    VS:  BP (!) 144/76   Pulse 83   Ht 5' 3.5" (1.613 m)   Wt 160 lb (72.6 kg)   LMP  (LMP Unknown)   SpO2 96%   BMI 27.90 kg/m     Wt Readings from Last 3 Encounters:  02/16/24 160 lb (72.6 kg)  07/29/23 161 lb 9.6 oz (73.3 kg)  05/17/23 158 lb 12.8 oz (72 kg)     GEN:  in no acute distress HEENT: Normal NECK: No JVD CARDIAC: RRR, 3/6 systolic heart murmur loudest at the RUSB RESPIRATORY: CTAB ABDOMEN: Soft, non-tender, non-distended MUSCULOSKELETAL:  No edema; No deformity  SKIN: Warm and dry NEUROLOGIC:  Alert and oriented x 3 PSYCHIATRIC:  Normal affect   ASSESSMENT:    1. Coronary artery disease  involving native coronary artery of native heart without angina pectoris   2. Aortic valve stenosis, etiology of cardiac valve disease unspecified   3. Essential hypertension   4. Hyperlipidemia, unspecified hyperlipidemia type       PLAN:    CAD: Calcium score 1176 (95th percentile) on 05/25/2022.  Denied any chest pain or dyspnea.  Cardiac PET scan on 08/24/2022 showed high risk findings, including lateral ischemia, decreased EF with stress, TID, severe three-vessel coronary calcifications, though absolute blood flows were normal. LHC 09/01/2022 showed 80% distal left main stenosis.  She underwent CABG x3 on 09/20/2022 (LIMA to LAD, SVG-OM1, SVG-OM 2). -Continue aspirin  -Continue atorvastatin 80 mg daily  Aortic stenosis: Echocardiogram 03/17/2021 showed moderate aortic stenosis (V-max 3 m/s, mean gradient 20 mmHg, AVA 1.0 cm, DI 0.3).  Echocardiogram 03/30/2022 showed moderate aortic stenosis (V-max 3.0 m/s, mean gradient 24 mmHg, AVA 0.8 cm, DI 0.34).  Dr Delia Chimes elected not to perform AVR at time of CABG, recommended TAVR evaluation when AS progresses to severe.  Repeat echo 04/2023 showed moderate aortic stenosis (V-max 3.0 m/s, mean gradient 21 mmHg, AVA 1.1 cm, DI 0.30) -Repeat echo 04/2024 for monitoring  Hypertension: On metoprolol 25 mg twice daily.  Elevated in clinic today but reports has been under control when she checks at home.  Asked to check BP twice daily for next week and let us know results  Hyperlipidemia: On atorvastatin 80 mg daily.  LDL 57 on 08/31/23  Type 2 diabetes: On Metformin, Januvia, Jardiance.  A1c 7.9% on 08/31/2023   RTC in 6 months    Medication Adjustments/Labs and Tests Ordered: Current medicines are reviewed at length with the patient today.  Concerns regarding medicines are outlined above.  Orders Placed This Encounter  Procedures   EKG 12-Lead   No orders of the defined types were placed in this encounter.   Patient Instructions      Follow-Up: At [facility], you and your health needs are our priority.  As part of our continuing mission to provide  you with exceptional heart care, we have created designated Provider Care Teams.  These Care Teams include your primary Cardiologist (physician) and Advanced Practice Providers (APPs -  Physician Assistants and Nurse Practitioners) who all work together to provide you with the care you need, when you need it.    Your next appointment:   6 month(s)  Provider:   Little Ishikawa, MD     Check blood pressure 2x daily for 1 week and let us know          Signed, Little Ishikawa, MD  02/16/2024 5:35 PM    [facility]

## 2024-02-16 NOTE — Patient Instructions (Signed)
    Follow-Up: At [facility], you and your health needs are our priority.  As part of our continuing mission to provide you with exceptional heart care, we have created designated Provider Care Teams.  These Care Teams include your primary Cardiologist (physician) and Advanced Practice Providers (APPs -  Physician Assistants and Nurse Practitioners) who all work together to provide you with the care you need, when you need it.    Your next appointment:   6 month(s)  Provider:   Little Ishikawa, MD     Check blood pressure 2x daily for 1 week and let us know

## 2024-02-29 ENCOUNTER — Telehealth: Payer: Self-pay | Admitting: Cardiology

## 2024-02-29 NOTE — Telephone Encounter (Signed)
 Paper Work Dropped Off: BP Readings  Date: 02/29/2024  Location of paper:  Provider Mailbox   **patient would like for someone call her**

## 2024-03-05 NOTE — Telephone Encounter (Signed)
 Called and made patient aware that blood pressure reading will be given to provider. Patient verbalized an understanding.

## 2024-03-08 DIAGNOSIS — E1169 Type 2 diabetes mellitus with other specified complication: Secondary | ICD-10-CM | POA: Diagnosis not present

## 2024-03-08 DIAGNOSIS — E7800 Pure hypercholesterolemia, unspecified: Secondary | ICD-10-CM | POA: Diagnosis not present

## 2024-03-08 DIAGNOSIS — I10 Essential (primary) hypertension: Secondary | ICD-10-CM | POA: Diagnosis not present

## 2024-03-12 DIAGNOSIS — E7800 Pure hypercholesterolemia, unspecified: Secondary | ICD-10-CM | POA: Diagnosis not present

## 2024-03-12 DIAGNOSIS — Z951 Presence of aortocoronary bypass graft: Secondary | ICD-10-CM | POA: Diagnosis not present

## 2024-03-12 DIAGNOSIS — Z0000 Encounter for general adult medical examination without abnormal findings: Secondary | ICD-10-CM | POA: Diagnosis not present

## 2024-03-12 DIAGNOSIS — M8580 Other specified disorders of bone density and structure, unspecified site: Secondary | ICD-10-CM | POA: Diagnosis not present

## 2024-03-12 DIAGNOSIS — I2510 Atherosclerotic heart disease of native coronary artery without angina pectoris: Secondary | ICD-10-CM | POA: Diagnosis not present

## 2024-03-12 DIAGNOSIS — I350 Nonrheumatic aortic (valve) stenosis: Secondary | ICD-10-CM | POA: Diagnosis not present

## 2024-03-12 DIAGNOSIS — I10 Essential (primary) hypertension: Secondary | ICD-10-CM | POA: Diagnosis not present

## 2024-03-12 DIAGNOSIS — E1165 Type 2 diabetes mellitus with hyperglycemia: Secondary | ICD-10-CM | POA: Diagnosis not present

## 2024-03-16 ENCOUNTER — Telehealth: Payer: Self-pay | Admitting: Cardiology

## 2024-03-16 NOTE — Telephone Encounter (Signed)
 Patient returned call today requesting update/recommendations on BP log that was dropped off

## 2024-03-16 NOTE — Telephone Encounter (Signed)
 Please see documentation in 3/26 telephone encounter

## 2024-03-16 NOTE — Telephone Encounter (Signed)
 See 3/26 encounter. Patient would like a call back to discuss any feedback Dr. Bjorn Pippin had for her regarding her BP readings she submitted a few weeks ago.

## 2024-03-19 NOTE — Telephone Encounter (Signed)
 I do not think I received the blood pressure measurements

## 2024-03-22 ENCOUNTER — Ambulatory Visit
Admission: RE | Admit: 2024-03-22 | Discharge: 2024-03-22 | Disposition: A | Discharge status: Home / Self Care | Payer: Medicare Other | Source: Ambulatory Visit | Attending: Family Medicine | Admitting: Family Medicine

## 2024-03-22 ENCOUNTER — Encounter: Payer: Self-pay | Admitting: *Deleted

## 2024-03-22 DIAGNOSIS — Z1231 Encounter for screening mammogram for malignant neoplasm of breast: Secondary | ICD-10-CM

## 2024-03-26 ENCOUNTER — Other Ambulatory Visit: Payer: Self-pay | Admitting: Cardiology

## 2024-03-29 NOTE — Telephone Encounter (Signed)
 Pt calling back in to speak about bp readings

## 2024-03-29 NOTE — Telephone Encounter (Signed)
 BP looks okay, would not recommend any changes at this point

## 2024-03-29 NOTE — Telephone Encounter (Signed)
 Spoke with patient, she called to report BP readings for Dr. Alda Amas.  4/23 at 9AM--132/78, HR 92 4/23 at 10PM--130/75, HR 86 4/22 at 7:20AM--139/84, HR 91 4/22 at 11PM--137/81, HR 88 4/21 at 9:30AM--109/68, HR 97 4/21 at 11PM--141/85, HR 82 4/20 AM--146/93, HR 91 4/20 at 3:30PM--126/80, HR 87 4/19 at 5AM--120/72, HR 72 4/19 at 9PM--124/78, HR 75 4/18 at 8AM--138/84, HR 80 4/18 at 9PM--131/76, HR79 4/17 AM--144/82, HR 80 4/17 PM--120/71, HR 91 4/16 at 2:16PM--131/82, HR 90 4/16 at 10PM--127/80, HR 101  Patient states morning BP measurements were taken at same time she took AM dose of Lopressor .  Will forward to Dr. Alda Amas to review.

## 2024-04-03 ENCOUNTER — Encounter: Payer: Self-pay | Admitting: *Deleted

## 2024-04-06 ENCOUNTER — Ambulatory Visit (HOSPITAL_COMMUNITY): Payer: Medicare Other | Attending: Cardiology

## 2024-04-06 DIAGNOSIS — I350 Nonrheumatic aortic (valve) stenosis: Secondary | ICD-10-CM | POA: Diagnosis not present

## 2024-04-06 LAB — ECHOCARDIOGRAM COMPLETE
AR max vel: 0.8 cm2
AV Area VTI: 0.82 cm2
AV Area mean vel: 0.74 cm2
AV Mean grad: 23.4 mmHg
AV Peak grad: 37.9 mmHg
Ao pk vel: 3.08 m/s
Area-P 1/2: 3.45 cm2
P 1/2 time: 278 ms
S' Lateral: 2.4 cm

## 2024-04-09 ENCOUNTER — Other Ambulatory Visit: Payer: Self-pay | Admitting: *Deleted

## 2024-04-09 ENCOUNTER — Telehealth: Payer: Self-pay | Admitting: Cardiology

## 2024-04-09 DIAGNOSIS — I350 Nonrheumatic aortic (valve) stenosis: Secondary | ICD-10-CM

## 2024-04-09 NOTE — Telephone Encounter (Signed)
 Pt requesting cb for Echo results, does not use her mychart

## 2024-04-09 NOTE — Telephone Encounter (Signed)
 According to the pts result note.. she hs been notified by Dr Lavern Potash nurse.. will close this encounter.

## 2024-04-16 ENCOUNTER — Ambulatory Visit: Payer: Medicare Other | Admitting: Cardiology

## 2024-06-05 DIAGNOSIS — H04123 Dry eye syndrome of bilateral lacrimal glands: Secondary | ICD-10-CM | POA: Diagnosis not present

## 2024-06-05 DIAGNOSIS — H18413 Arcus senilis, bilateral: Secondary | ICD-10-CM | POA: Diagnosis not present

## 2024-06-05 DIAGNOSIS — H26491 Other secondary cataract, right eye: Secondary | ICD-10-CM | POA: Diagnosis not present

## 2024-06-05 DIAGNOSIS — Z961 Presence of intraocular lens: Secondary | ICD-10-CM | POA: Diagnosis not present

## 2024-06-15 DIAGNOSIS — E119 Type 2 diabetes mellitus without complications: Secondary | ICD-10-CM | POA: Diagnosis not present

## 2024-06-15 DIAGNOSIS — I10 Essential (primary) hypertension: Secondary | ICD-10-CM | POA: Diagnosis not present

## 2024-06-15 DIAGNOSIS — R350 Frequency of micturition: Secondary | ICD-10-CM | POA: Diagnosis not present

## 2024-06-20 DIAGNOSIS — N390 Urinary tract infection, site not specified: Secondary | ICD-10-CM | POA: Diagnosis not present

## 2024-07-11 ENCOUNTER — Other Ambulatory Visit: Payer: Self-pay | Admitting: Cardiology

## 2024-07-11 ENCOUNTER — Telehealth: Payer: Self-pay | Admitting: Cardiology

## 2024-07-11 NOTE — Telephone Encounter (Signed)
 would like for someone come talk to her about her test results. She calls Mutiple times and has not got a call back yet . Patient in [place]

## 2024-07-11 NOTE — Telephone Encounter (Signed)
 Patient present in clinic, asked to speak to someone to review echo results fro 04/06/24. Reviewed Dr. Alvan notes about moderate to severe aortic stenosis. Patient verbalizes understanding any new symptoms. Patient already has f/u appt scheduled for September and verbalizes no other needs.

## 2024-08-04 DIAGNOSIS — S60465A Insect bite (nonvenomous) of left ring finger, initial encounter: Secondary | ICD-10-CM | POA: Diagnosis not present

## 2024-08-04 DIAGNOSIS — L03012 Cellulitis of left finger: Secondary | ICD-10-CM | POA: Diagnosis not present

## 2024-08-19 NOTE — Progress Notes (Signed)
 Cardiology Office Note:    Date:  08/20/2024   ID:  Sheila Keller, DOB Dec 20, 1945, MRN [id number]  PCP:  Sun, Vyvyan, MD  Cardiologist:  Lonni LITTIE Nanas, MD  Electrophysiologist:  None   Referring MD: Sun, Vyvyan, MD   Chief Complaint  Patient presents with   Aortic Stenosis    History of Present Illness:    Sheila Keller is a 78 y.o. female with a hx of CAD status post CABG, aortic stenosis, hypertension, diabetes, hyperlipidemia who presents for follow-up. TTE on 03/05/2020 showed hyperdynamic LV systolic function, mild concentric LVH, grade 1 diastolic dysfunction, normal RV function, normal PASP, mild to moderate aortic stenosis.  Echocardiogram 03/17/2021 showed LVEF 70 to 75%, grade 1 diastolic dysfunction, normal RV function, moderate aortic stenosis.  Echocardiogram 03/30/2022 showed EF 70 to 75%, grade 1 diastolic dysfunction, LV mid cavitary gradient 22 mmHg (43 mmHg with Valsalva), normal RV function, moderate aortic stenosis (V-max 3 m/s, mean gradient 24 mmHg, AVA 0.8 cm, DI 0.34).  Cardiac PET scan on 08/24/2022 showed high risk findings, including lateral ischemia, decreased EF with stress, TID, severe three-vessel coronary calcifications, though absolute blood flows were normal.  LHC 09/01/2022 showed 80% distal left main stenosis.  She underwent CABG x3 on 09/20/2022 (LIMA to LAD, SVG-OM1, SVG-OM 2).  Echocardiogram 04/06/2023 showed EF 65 to 70%, grade 1 diastolic dysfunction, normal RV function, moderate aortic stenosis.  Echocardiogram 04/2024 showed moderate to severe aortic stenosis (V-max 3.1 m/s, mean gradient 23 mmHg, AVA 0.8 cm, DI 0.28).   Since last clinic visit, she reports she is doing okay.  Does report having some shortness of breath with exertion.  Denies any chest pain,  lightheadedness, syncope, lower extremity edema, or palpitations.     Wt Readings from Last 3 Encounters:  08/20/24 156 lb 6.4 oz (70.9 kg)  02/16/24 160 lb (72.6 kg)  07/29/23 161 lb  9.6 oz (73.3 kg)   BP Readings from Last 3 Encounters:  08/20/24 128/72  02/16/24 (!) 144/76  07/29/23 118/80      Past Medical History:  Diagnosis Date   Anxiety    Breast lump 08/2017   no cancer/ [facility]   Chronic cystitis    Diabetes mellitus    type 2   Elevated cholesterol    Heart murmur    Aortic valve stenosis.  Sees cardiologist with [facility], no current problems per patient   Hypertension    Mild dysplasia of cervix (CIN I)    Osteopenia 02/2018   T score -1.8 FRAX 11% / 2.2%    Past Surgical History:  Procedure Laterality Date   BREAST BIOPSY Left    CATARACT EXTRACTION Bilateral    x2    CERVICAL BIOPSY  W/ LOOP ELECTRODE EXCISION  2009   CHOLECYSTECTOMY     colonoscopy     COLPOSCOPY     CORONARY ARTERY BYPASS GRAFT N/A 09/20/2022   Procedure: CORONARY ARTERY BYPASS GRAFTING (CABG) X3 USING ENDOSCOPICALLY HARVESTED RIGHT GREATER SAPHENOUS VEIN;  Surgeon: Murriel Toribio DEL, MD;  Location: [facility] OR;  Service: Open Heart Surgery;  Laterality: N/A;   DILATION AND CURETTAGE OF UTERUS     IR THORACENTESIS ASP PLEURAL SPACE W/IMG GUIDE  10/08/2022   LEFT HEART CATH AND CORONARY ANGIOGRAPHY N/A 09/01/2022   Procedure: LEFT HEART CATH AND CORONARY ANGIOGRAPHY;  Surgeon: Claudene Victory ORN, MD;  Location: [facility] INVASIVE CV LAB;  Service: Cardiovascular;  Laterality: N/A;   TEE WITHOUT CARDIOVERSION N/A 09/20/2022  Procedure: TRANSESOPHAGEAL ECHOCARDIOGRAM (TEE);  Surgeon: Murriel Toribio DEL, MD;  Location: [facility] OR;  Service: Open Heart Surgery;  Laterality: N/A;   TUBAL LIGATION     vocal cord surgery     nodules excised    Current Medications: Current Meds  Medication Sig   aspirin  EC (ASPIRIN  LOW DOSE) 81 MG tablet TAKE 1 TABLET BY MOUTH EVERY DAY   atorvastatin  (LIPITOR ) 80 MG tablet TAKE 1 TABLET BY MOUTH EVERY DAY   Cholecalciferol (VITAMIN D3) 2000 units TABS Take 2,000 Units by mouth every morning.   docusate sodium  (COLACE) 100 MG capsule Take 100 mg  by mouth daily.   empagliflozin  (JARDIANCE ) 25 MG TABS tablet Take 25 mg by mouth daily.   metFORMIN  (GLUCOPHAGE ) 1000 MG tablet Take 1,000 mg by mouth 2 (two) times daily.   metoprolol  tartrate (LOPRESSOR ) 25 MG tablet TAKE 1 TABLET BY MOUTH TWICE A DAY   sitaGLIPtin (JANUVIA) 100 MG tablet Take 100 mg by mouth daily.     Allergies:   Bee venom, Farxiga [dapagliflozin], and Penicillins   Social History   Socioeconomic History   Marital status: Divorced    Spouse name: Not on file   Number of children: Not on file   Years of education: Not on file   Highest education level: Not on file  Occupational History   Not on file  Tobacco Use   Smoking status: Never   Smokeless tobacco: Never  Vaping Use   Vaping status: Never Used  Substance and Sexual Activity   Alcohol use: No    Alcohol/week: 0.0 standard drinks of alcohol   Drug use: No   Sexual activity: Not Currently    Birth control/protection: Post-menopausal, Surgical    Comment: First sexual encounter at age 9. Less than 5 partners in her life.  Tubal Ligation  Other Topics Concern   Not on file  Social History Narrative   Not on file   Social Drivers of Health   Financial Resource Strain: Not on file  Food Insecurity: Low Risk  (11/21/2023)   Received from [facility]   Hunger Vital Sign    Within the past 12 months, you worried that your food would run out before you got money to buy more: Never true    Within the past 12 months, the food you bought just didn't last and you didn't have money to get more. : Never true  Transportation Needs: No Transportation Needs (11/21/2023)   Received from [facility]    In the past 12 months, has lack of reliable transportation kept you from medical appointments, meetings, work or from getting things needed for daily living? : No  Physical Activity: Not on file  Stress: Not on file  Social Connections: Unknown (04/20/2022)   Received from [facility]    Social Network    Social Network: Not on file     Family History: The patient's family history includes Breast cancer in her maternal aunt and mother; Diabetes in her mother and son; Heart attack in her son; Heart disease in her mother; Hypertension in her mother; Lung cancer in her father; Stroke in her mother.  ROS:   Please see the history of present illness.    All other systems reviewed and are negative.  EKGs/Labs/Other Studies Reviewed:    The following studies were reviewed today:   EKG:    08/20/24: Normal sinus rhythm, rate 85, incomplete right bundle branch block, 02/16/2024: Normal sinus rhythm, rate  81, right bundle branch block, left posterior fascicular block, T wave inversions in inferior leads and V1-4 07/29/2023: Normal sinus rhythm, incomplete right bundle branch block, rate 98, T wave inversions in V3/4 10/13/2022: Normal sinus rhythm, rate 78, right axis deviation, T wave inversions in inferior and precordial leads 08/26/2022: Sinus tachycardia, rate 109, no ST abnormalities 04/06/22:  Normal sinus rhythm, rate 101, no ST abnormalities 02/18/2020: normal sinus rhythm, rate 108, right axis deviation, no ST abnormalities, Q waves in III, aVF 04/01/2021: Sinus rhythm. Rate 88 bpm. No ST abnormalities.  Recent Labs: No results found for requested labs within last 365 days.  Recent Lipid Panel    Component Value Date/Time   CHOL 110 10/13/2022 1201   TRIG 145 10/13/2022 1201   HDL 43 10/13/2022 1201   CHOLHDL 2.6 10/13/2022 1201   LDLCALC 42 10/13/2022 1201    Physical Exam:    VS:  BP 128/72   Pulse 75   Ht 5' 3.5 (1.613 m)   Wt 156 lb 6.4 oz (70.9 kg)   LMP  (LMP Unknown)   SpO2 97%   BMI 27.27 kg/m     Wt Readings from Last 3 Encounters:  08/20/24 156 lb 6.4 oz (70.9 kg)  02/16/24 160 lb (72.6 kg)  07/29/23 161 lb 9.6 oz (73.3 kg)     GEN:  in no acute distress HEENT: Normal NECK: No JVD CARDIAC: RRR, 3/6 systolic heart murmur loudest at the  RUSB RESPIRATORY: CTAB ABDOMEN: Soft, non-tender, non-distended MUSCULOSKELETAL:  No edema; No deformity  SKIN: Warm and dry NEUROLOGIC:  Alert and oriented x 3 PSYCHIATRIC:  Normal affect   ASSESSMENT:    1. Aortic valve stenosis, etiology of cardiac valve disease unspecified   2. Coronary artery disease involving native coronary artery of native heart without angina pectoris   3. Essential hypertension   4. Hyperlipidemia, unspecified hyperlipidemia type        PLAN:    CAD: Calcium  score 1176 (95th percentile) on 05/25/2022.  Denied any chest pain or dyspnea.  Cardiac PET scan on 08/24/2022 showed high risk findings, including lateral ischemia, decreased EF with stress, TID, severe three-vessel coronary calcifications, though absolute blood flows were normal. LHC 09/01/2022 showed 80% distal left main stenosis.  She underwent CABG x3 on 09/20/2022 (LIMA to LAD, SVG-OM1, SVG-OM 2). -Continue aspirin   -Continue atorvastatin  80 mg daily  Aortic stenosis: Echocardiogram 03/30/2022 showed moderate aortic stenosis (V-max 3.0 m/s, mean gradient 24 mmHg, AVA 0.8 cm, DI 0.34).  Dr Murriel elected not to perform AVR at time of CABG, recommended TAVR evaluation when AS progresses to severe.  Repeat echo 04/2023 showed moderate aortic stenosis (V-max 3.0 m/s, mean gradient 21 mmHg, AVA 1.1 cm, DI 0.30).  Echocardiogram 04/2024 showed moderate to severe aortic stenosis (V-max 3.1 m/s, mean gradient 23 mmHg, AVA 0.8 cm, DI 0.28). -Repeat echo 10/2024 for monitoring.  If progressing to severe we will plan referral to valve clinic  Hypertension: On metoprolol  25 mg twice daily.  Appears controlled.   Hyperlipidemia: On atorvastatin  80 mg daily.  LDL 66 on 03/08/24  Type 2 diabetes: On Metformin , Januvia, Jardiance .  A1c 8.2% on 03/08/24   RTC in 6 months    Medication Adjustments/Labs and Tests Ordered: Current medicines are reviewed at length with the patient today.  Concerns regarding medicines  are outlined above.  Orders Placed This Encounter  Procedures   EKG 12-Lead   No orders of the defined types were placed in this encounter.  Patient Instructions  Medication Instructions:  Your physician recommends that you continue on your current medications as directed. Please refer to the Current Medication list given to you today.  *If you need a refill on your cardiac medications before your next appointment, please call your pharmacy*  Lab Work: none If you have labs (blood work) drawn today and your tests are completely normal, you will receive your results only by: MyChart Message (if you have MyChart) OR A paper copy in the mail If you have any lab test that is abnormal or we need to change your treatment, we will call you to review the results.  Testing/Procedures: none  Follow-Up: At [facility], you and your health needs are our priority.  As part of our continuing mission to provide you with exceptional heart care, our providers are all part of one team.  This team includes your primary Cardiologist (physician) and Advanced Practice Providers or APPs (Physician Assistants and Nurse Practitioners) who all work together to provide you with the care you need, when you need it.  Your next appointment:   6 month(s)  Provider:   Lonni LITTIE Nanas, MD    We recommend signing up for the patient portal called MyChart.  Sign up information is provided on this After Visit Summary.  MyChart is used to connect with patients for Virtual Visits (Telemedicine).  Patients are able to view lab/test results, encounter notes, upcoming appointments, etc.  Non-urgent messages can be sent to your provider as well.   To learn more about what you can do with MyChart, go to [email or web address].   Other Instructions none              Signed, Lonni LITTIE Nanas, MD  08/20/2024 2:25 PM    [facility]

## 2024-08-20 ENCOUNTER — Encounter: Payer: Self-pay | Admitting: Cardiology

## 2024-08-20 ENCOUNTER — Ambulatory Visit: Attending: Cardiology | Admitting: Cardiology

## 2024-08-20 VITALS — BP 128/72 | HR 75 | Ht 63.5 in | Wt 156.4 lb

## 2024-08-20 DIAGNOSIS — I10 Essential (primary) hypertension: Secondary | ICD-10-CM | POA: Diagnosis not present

## 2024-08-20 DIAGNOSIS — E785 Hyperlipidemia, unspecified: Secondary | ICD-10-CM | POA: Diagnosis not present

## 2024-08-20 DIAGNOSIS — I2510 Atherosclerotic heart disease of native coronary artery without angina pectoris: Secondary | ICD-10-CM

## 2024-08-20 DIAGNOSIS — I350 Nonrheumatic aortic (valve) stenosis: Secondary | ICD-10-CM | POA: Diagnosis not present

## 2024-08-20 NOTE — Patient Instructions (Signed)
 Medication Instructions:  Your physician recommends that you continue on your current medications as directed. Please refer to the Current Medication list given to you today.  *If you need a refill on your cardiac medications before your next appointment, please call your pharmacy*  Lab Work: none If you have labs (blood work) drawn today and your tests are completely normal, you will receive your results only by: MyChart Message (if you have MyChart) OR A paper copy in the mail If you have any lab test that is abnormal or we need to change your treatment, we will call you to review the results.  Testing/Procedures: none  Follow-Up: At [facility], you and your health needs are our priority.  As part of our continuing mission to provide you with exceptional heart care, our providers are all part of one team.  This team includes your primary Cardiologist (physician) and Advanced Practice Providers or APPs (Physician Assistants and Nurse Practitioners) who all work together to provide you with the care you need, when you need it.  Your next appointment:   6 month(s)  Provider:   Lonni LITTIE Nanas, MD    We recommend signing up for the patient portal called MyChart.  Sign up information is provided on this After Visit Summary.  MyChart is used to connect with patients for Virtual Visits (Telemedicine).  Patients are able to view lab/test results, encounter notes, upcoming appointments, etc.  Non-urgent messages can be sent to your provider as well.   To learn more about what you can do with MyChart, go to [email or web address].   Other Instructions none

## 2024-09-06 ENCOUNTER — Encounter (HOSPITAL_COMMUNITY): Payer: Self-pay | Admitting: *Deleted

## 2024-09-06 ENCOUNTER — Other Ambulatory Visit: Payer: Self-pay

## 2024-09-06 ENCOUNTER — Emergency Department (HOSPITAL_COMMUNITY)

## 2024-09-06 ENCOUNTER — Emergency Department (HOSPITAL_COMMUNITY)
Admission: EM | Admit: 2024-09-06 | Discharge: 2024-09-06 | Disposition: A | Discharge status: Home / Self Care | Attending: Emergency Medicine | Admitting: Emergency Medicine

## 2024-09-06 DIAGNOSIS — Z7982 Long term (current) use of aspirin: Secondary | ICD-10-CM | POA: Insufficient documentation

## 2024-09-06 DIAGNOSIS — N939 Abnormal uterine and vaginal bleeding, unspecified: Secondary | ICD-10-CM | POA: Diagnosis present

## 2024-09-06 LAB — HEPATIC FUNCTION PANEL
ALT: 20 U/L (ref 0–44)
AST: 26 U/L (ref 15–41)
Albumin: 4.1 g/dL (ref 3.5–5.0)
Alkaline Phosphatase: 48 U/L (ref 38–126)
Bilirubin, Direct: 0.2 mg/dL (ref 0.0–0.2)
Indirect Bilirubin: 0.3 mg/dL (ref 0.3–0.9)
Total Bilirubin: 0.5 mg/dL (ref 0.0–1.2)
Total Protein: 6.8 g/dL (ref 6.5–8.1)

## 2024-09-06 LAB — URINALYSIS, ROUTINE W REFLEX MICROSCOPIC
Bacteria, UA: NONE SEEN
Bilirubin Urine: NEGATIVE
Glucose, UA: >=500 mg/dL — AB
Hgb urine dipstick: NEGATIVE
Ketones, ur: 5 mg/dL — AB
Leukocytes,Ua: NEGATIVE
Nitrite: NEGATIVE
Protein, ur: NEGATIVE mg/dL
Specific Gravity, Urine: 1.027 (ref 1.005–1.030)
pH: 5 (ref 5.0–8.0)

## 2024-09-06 LAB — BASIC METABOLIC PANEL WITH GFR
Anion gap: 14 (ref 5–15)
BUN: 12 mg/dL (ref 8–23)
CO2: 18 mmol/L — ABNORMAL LOW (ref 22–32)
Calcium: 9.1 mg/dL (ref 8.9–10.3)
Chloride: 109 mmol/L (ref 98–111)
Creatinine, Ser: 0.65 mg/dL (ref 0.44–1.00)
GFR, Estimated: >60 mL/min (ref >60)
Glucose, Bld: 196 mg/dL — ABNORMAL HIGH (ref 70–99)
Potassium: 3.8 mmol/L (ref 3.5–5.1)
Sodium: 141 mmol/L (ref 135–145)

## 2024-09-06 LAB — CBC WITH DIFFERENTIAL/PLATELET
Abs Immature Granulocytes: 0.01 K/uL (ref 0.00–0.07)
Basophils Absolute: 0 K/uL (ref 0.0–0.1)
Basophils Relative: 1 %
Eosinophils Absolute: 0.3 K/uL (ref 0.0–0.5)
Eosinophils Relative: 7 %
HCT: 41 % (ref 36.0–46.0)
Hemoglobin: 13.4 g/dL (ref 12.0–15.0)
Immature Granulocytes: 0 %
Lymphocytes Relative: 33 %
Lymphs Abs: 1.3 K/uL (ref 0.7–4.0)
MCH: 31.2 pg (ref 26.0–34.0)
MCHC: 32.7 g/dL (ref 30.0–36.0)
MCV: 95.6 fL (ref 80.0–100.0)
Monocytes Absolute: 0.4 K/uL (ref 0.1–1.0)
Monocytes Relative: 10 %
Neutro Abs: 1.9 K/uL (ref 1.7–7.7)
Neutrophils Relative %: 49 %
Platelets: 160 K/uL (ref 150–400)
RBC: 4.29 MIL/uL (ref 3.87–5.11)
RDW: 12.8 % (ref 11.5–15.5)
WBC: 4 K/uL (ref 4.0–10.5)
nRBC: 0 % (ref 0.0–0.2)

## 2024-09-06 NOTE — ED Provider Notes (Signed)
 [place] EMERGENCY DEPARTMENT AT [facility] Provider Note   CSN: [id number] Arrival date & time: 09/06/24  [id number]     Patient presents with: Vaginal Bleeding   Sheila Keller is a 78 y.o. female.   Patient states she had moderate bleeding from her vagina today.  She takes aspirin  and has not had a hysterectomy  The history is provided by the patient and medical records. No language interpreter was used.  Vaginal Bleeding Quality:  Bright red Severity:  Mild Onset quality:  Sudden Timing:  Intermittent Progression:  Waxing and waning Chronicity:  New Possible pregnancy: no   Context: not after intercourse   Associated symptoms: no abdominal pain, no back pain and no fatigue        Prior to Admission medications   Medication Sig Start Date End Date Taking? Authorizing Provider  aspirin  EC (ASPIRIN  LOW DOSE) 81 MG tablet TAKE 1 TABLET BY MOUTH EVERY DAY 07/12/24  Yes Kate Lonni CROME, MD  atorvastatin  (LIPITOR ) 80 MG tablet TAKE 1 TABLET BY MOUTH EVERY DAY 04/08/23  Yes Kate Lonni CROME, MD  diphenhydrAMINE (BENADRYL) 25 mg capsule Take 25 mg by mouth every 6 (six) hours as needed for allergies.   Yes [provider]  docusate sodium  (COLACE) 100 MG capsule Take 100 mg by mouth daily.   Yes [provider]  empagliflozin  (JARDIANCE ) 25 MG TABS tablet Take 25 mg by mouth daily.   Yes [provider]  metFORMIN  (GLUCOPHAGE ) 1000 MG tablet Take 1,000 mg by mouth 2 (two) times daily. 10/09/22  Yes [provider]  metoprolol  tartrate (LOPRESSOR ) 25 MG tablet TAKE 1 TABLET BY MOUTH TWICE A DAY 03/26/24  Yes Kate Lonni CROME, MD  sitaGLIPtin (JANUVIA) 100 MG tablet Take 100 mg by mouth daily.   Yes [provider]  Cholecalciferol (VITAMIN D3) 2000 units TABS Take 2,000 Units by mouth every morning.    [provider]    Allergies: Bee venom, Farxiga [dapagliflozin], and Penicillins    Review of  Systems  Constitutional:  Negative for appetite change and fatigue.  HENT:  Negative for congestion, ear discharge and sinus pressure.   Eyes:  Negative for discharge.  Respiratory:  Negative for cough.   Cardiovascular:  Negative for chest pain.  Gastrointestinal:  Negative for abdominal pain and diarrhea.  Genitourinary:  Positive for vaginal bleeding. Negative for frequency and hematuria.  Musculoskeletal:  Negative for back pain.  Skin:  Negative for rash.  Neurological:  Negative for seizures and headaches.  Psychiatric/Behavioral:  Negative for hallucinations.     Updated Vital Signs BP (!) 149/82   Pulse 76   Temp 98.2 F (36.8 C) (Oral)   Resp 18   Ht 5' 3 (1.6 m)   Wt 70.8 kg   LMP  (LMP Unknown)   SpO2 98%   BMI 27.63 kg/m   Physical Exam Vitals and nursing note reviewed.  Constitutional:      Appearance: She is well-developed.  HENT:     Head: Normocephalic.     Nose: Nose normal.  Eyes:     General: No scleral icterus.    Conjunctiva/sclera: Conjunctivae normal.  Neck:     Thyroid : No thyromegaly.  Cardiovascular:     Rate and Rhythm: Normal rate and regular rhythm.     Heart sounds: No murmur heard.    No friction rub. No gallop.  Pulmonary:     Breath sounds: No stridor. No wheezing or rales.  Chest:  Chest wall: No tenderness.  Abdominal:     General: There is no distension.     Tenderness: There is no abdominal tenderness. There is no rebound.  Genitourinary:    Comments: No bleeding seen in pelvic exam Musculoskeletal:        General: Normal range of motion.     Cervical back: Neck supple.  Lymphadenopathy:     Cervical: No cervical adenopathy.  Skin:    Findings: No erythema or rash.  Neurological:     Mental Status: She is alert and oriented to person, place, and time.     Motor: No abnormal muscle tone.     Coordination: Coordination normal.  Psychiatric:        Behavior: Behavior normal.     (all labs ordered are listed, but  only abnormal results are displayed) Labs Reviewed  URINALYSIS, ROUTINE W REFLEX MICROSCOPIC - Abnormal; Notable for the following components:      Result Value   Glucose, UA >=500 (*)    Ketones, ur 5 (*)    All other components within normal limits  BASIC METABOLIC PANEL WITH GFR - Abnormal; Notable for the following components:   CO2 18 (*)    Glucose, Bld 196 (*)    All other components within normal limits  CBC WITH DIFFERENTIAL/PLATELET  HEPATIC FUNCTION PANEL    EKG: None  Radiology: US  Pelvis Complete Result Date: 09/06/2024 CLINICAL DATA:  Postmenopausal bleeding. EXAM: TRANSABDOMINAL ULTRASOUND OF PELVIS TECHNIQUE: Transabdominal ultrasound examination of the pelvis was performed including evaluation of the uterus, ovaries, adnexal regions, and pelvic cul-de-sac. COMPARISON:  May 12, 2016. FINDINGS: Uterus Not definitively visualized. However, patient does not report history of hysterectomy. Endometrium Not visualized. Right ovary Not visualized. Left ovary Not visualized. Other findings:  No abnormal free fluid. IMPRESSION: Uterus and ovaries are not visualized. However, patient does not report history of hysterectomy. CT or MRI scan is recommended for further evaluation. Electronically Signed   By: Lynwood Landy Raddle M.D.   On: 09/06/2024 09:21     Procedures   Medications Ordered in the ED - No data to display                                  Medical Decision Making Amount and/or Complexity of Data Reviewed Labs: ordered. Radiology: ordered.   Vaginal bleeding.  Ultrasound unremarkable and pelvic unremarkable.  Urinalysis normal.  Patient is referred back to her GYN doctor for further evaluation     Final diagnoses:  Vaginal bleeding  Abnormal uterine bleeding (AUB)    ED Discharge Orders     None          Suzette Pac, MD 09/10/24 1620

## 2024-09-06 NOTE — Discharge Instructions (Signed)
 Follow-up with your OB/GYN doctor in the next couple weeks for recheck.  Return here if heavy bleeding will not stop

## 2024-09-06 NOTE — ED Notes (Signed)
 Pt is prepped for a pelvic exam at this time. Pt is unable to give urine sample due to voiding before arrival. Pt will let us  know when she needs to go. RN made aware.

## 2024-09-06 NOTE — ED Triage Notes (Signed)
 BIB POV with family from home for vaginal bleeding. Onset at 5a this am upon waking. Endorses bright red, no clots, heavy, as well as mid low back pain and urinary frequency. Denies abd/pelvic pain, cramping, NVD, fever, syncope, sob or dizziness. Rates pain 7/10. Has not taken her meds this am or eaten. Alert, NAD, calm, steady gait.

## 2024-09-08 ENCOUNTER — Other Ambulatory Visit: Payer: Self-pay

## 2024-09-08 ENCOUNTER — Emergency Department (HOSPITAL_COMMUNITY)

## 2024-09-08 ENCOUNTER — Emergency Department (HOSPITAL_COMMUNITY)
Admission: EM | Admit: 2024-09-08 | Discharge: 2024-09-08 | Disposition: A | Discharge status: Home / Self Care | Attending: Emergency Medicine | Admitting: Emergency Medicine

## 2024-09-08 ENCOUNTER — Encounter (HOSPITAL_COMMUNITY): Payer: Self-pay

## 2024-09-08 DIAGNOSIS — W010XXA Fall on same level from slipping, tripping and stumbling without subsequent striking against object, initial encounter: Secondary | ICD-10-CM | POA: Insufficient documentation

## 2024-09-08 DIAGNOSIS — Z7982 Long term (current) use of aspirin: Secondary | ICD-10-CM | POA: Insufficient documentation

## 2024-09-08 DIAGNOSIS — S80211A Abrasion, right knee, initial encounter: Secondary | ICD-10-CM | POA: Diagnosis not present

## 2024-09-08 DIAGNOSIS — I6782 Cerebral ischemia: Secondary | ICD-10-CM | POA: Diagnosis not present

## 2024-09-08 DIAGNOSIS — Z7984 Long term (current) use of oral hypoglycemic drugs: Secondary | ICD-10-CM | POA: Diagnosis not present

## 2024-09-08 DIAGNOSIS — S90811A Abrasion, right foot, initial encounter: Secondary | ICD-10-CM | POA: Diagnosis not present

## 2024-09-08 DIAGNOSIS — M47812 Spondylosis without myelopathy or radiculopathy, cervical region: Secondary | ICD-10-CM | POA: Diagnosis not present

## 2024-09-08 DIAGNOSIS — M4802 Spinal stenosis, cervical region: Secondary | ICD-10-CM | POA: Diagnosis not present

## 2024-09-08 DIAGNOSIS — M25461 Effusion, right knee: Secondary | ICD-10-CM | POA: Diagnosis not present

## 2024-09-08 DIAGNOSIS — S0003XA Contusion of scalp, initial encounter: Secondary | ICD-10-CM | POA: Diagnosis not present

## 2024-09-08 DIAGNOSIS — G9389 Other specified disorders of brain: Secondary | ICD-10-CM | POA: Diagnosis not present

## 2024-09-08 DIAGNOSIS — M19072 Primary osteoarthritis, left ankle and foot: Secondary | ICD-10-CM | POA: Diagnosis not present

## 2024-09-08 DIAGNOSIS — I10 Essential (primary) hypertension: Secondary | ICD-10-CM | POA: Diagnosis not present

## 2024-09-08 DIAGNOSIS — E119 Type 2 diabetes mellitus without complications: Secondary | ICD-10-CM | POA: Insufficient documentation

## 2024-09-08 DIAGNOSIS — M4312 Spondylolisthesis, cervical region: Secondary | ICD-10-CM | POA: Diagnosis not present

## 2024-09-08 DIAGNOSIS — S0093XA Contusion of unspecified part of head, initial encounter: Secondary | ICD-10-CM

## 2024-09-08 DIAGNOSIS — S0083XA Contusion of other part of head, initial encounter: Secondary | ICD-10-CM | POA: Diagnosis not present

## 2024-09-08 DIAGNOSIS — S50311A Abrasion of right elbow, initial encounter: Secondary | ICD-10-CM | POA: Insufficient documentation

## 2024-09-08 DIAGNOSIS — S80212A Abrasion, left knee, initial encounter: Secondary | ICD-10-CM | POA: Insufficient documentation

## 2024-09-08 DIAGNOSIS — Z043 Encounter for examination and observation following other accident: Secondary | ICD-10-CM | POA: Diagnosis not present

## 2024-09-08 DIAGNOSIS — W19XXXA Unspecified fall, initial encounter: Secondary | ICD-10-CM

## 2024-09-08 DIAGNOSIS — M1711 Unilateral primary osteoarthritis, right knee: Secondary | ICD-10-CM | POA: Diagnosis not present

## 2024-09-08 NOTE — ED Provider Notes (Signed)
 [place] EMERGENCY DEPARTMENT AT [facility] Provider Note   CSN: [id number] Arrival date & time: 09/08/24  1648     Patient presents with: Sheila Keller KATHEE Girard is a 78 y.o. female.   Pt is a 78 yo female with pmhx significant for dm2, osteopenia, htn, hld, and anxiety.  Pt said she tripped over a cord to her answering machine and fell.  She did hit the right side of her forehead and sustained a hematoma.  She denies loc or dizziness.  She has a little scrape to her right elbow, right knee, and left foot.  She said she's on blood thinners, but is only on baby ASA.       Prior to Admission medications   Medication Sig Start Date End Date Taking? Authorizing Provider  aspirin  EC (ASPIRIN  LOW DOSE) 81 MG tablet TAKE 1 TABLET BY MOUTH EVERY DAY 07/12/24   Kate Lonni CROME, MD  atorvastatin  (LIPITOR ) 80 MG tablet TAKE 1 TABLET BY MOUTH EVERY DAY 04/08/23   Kate Lonni CROME, MD  Cholecalciferol (VITAMIN D3) 2000 units TABS Take 2,000 Units by mouth every morning.    [provider]  diphenhydrAMINE (BENADRYL) 25 mg capsule Take 25 mg by mouth every 6 (six) hours as needed for allergies.    [provider]  docusate sodium  (COLACE) 100 MG capsule Take 100 mg by mouth daily.    [provider]  empagliflozin  (JARDIANCE ) 25 MG TABS tablet Take 25 mg by mouth daily.    [provider]  metFORMIN  (GLUCOPHAGE ) 1000 MG tablet Take 1,000 mg by mouth 2 (two) times daily. 10/09/22   [provider]  metoprolol  tartrate (LOPRESSOR ) 25 MG tablet TAKE 1 TABLET BY MOUTH TWICE A DAY 03/26/24   Kate Lonni CROME, MD  sitaGLIPtin (JANUVIA) 100 MG tablet Take 100 mg by mouth daily.    [provider]    Allergies: Bee venom, Farxiga [dapagliflozin], and Penicillins    Review of Systems  Musculoskeletal:        Mild right knee and left foot pain  Skin:        Tiny abrasion to right elbow, right knee, left foot  All other  systems reviewed and are negative.   Updated Vital Signs BP (!) 144/95   Pulse 79   Temp 98.2 F (36.8 C) (Oral)   Resp 16   Ht 5' 3 (1.6 m)   Wt 70.8 kg   LMP  (LMP Unknown)   SpO2 98%   BMI 27.63 kg/m   Physical Exam Vitals and nursing note reviewed.  Constitutional:      Appearance: Normal appearance.  HENT:     Head: Normocephalic.     Comments: Hematoma right forehead    Right Ear: External ear normal.     Left Ear: External ear normal.     Nose: Nose normal.     Mouth/Throat:     Mouth: Mucous membranes are moist.     Pharynx: Oropharynx is clear.  Eyes:     Extraocular Movements: Extraocular movements intact.     Conjunctiva/sclera: Conjunctivae normal.     Pupils: Pupils are equal, round, and reactive to light.  Cardiovascular:     Rate and Rhythm: Normal rate and regular rhythm.     Pulses: Normal pulses.     Heart sounds: Normal heart sounds.  Pulmonary:     Effort: Pulmonary effort is normal.     Breath sounds: Normal breath sounds.  Abdominal:     General: Abdomen is flat. Bowel sounds are normal.     Palpations: Abdomen is soft.  Musculoskeletal:        General: Normal range of motion.     Cervical back: Normal range of motion and neck supple.  Skin:    General: Skin is warm.     Capillary Refill: Capillary refill takes less than 2 seconds.     Comments: Small abrasion right elbow, right knee, left knee  Neurological:     General: No focal deficit present.     Mental Status: She is alert and oriented to person, place, and time.  Psychiatric:        Mood and Affect: Mood normal.        Behavior: Behavior normal.        Thought Content: Thought content normal.     (all labs ordered are listed, but only abnormal results are displayed) Labs Reviewed - No data to display  EKG: None  Radiology: DG Foot Complete Left Result Date: 09/08/2024 CLINICAL DATA:  Status post fall. EXAM: LEFT FOOT - COMPLETE 3+ VIEW COMPARISON:  None Available.  FINDINGS: There is no evidence of an acute fracture or dislocation. Mild degenerative changes are seen involving the mid left foot and multiple interphalangeal joints. Soft tissues are unremarkable. IMPRESSION: Mild degenerative changes without evidence of acute fracture. Electronically Signed   By: Suzen Dials M.D.   On: 09/08/2024 19:01   DG Knee Complete 4 Views Right Result Date: 09/08/2024 CLINICAL DATA:  Status post fall. EXAM: RIGHT KNEE - COMPLETE 4+ VIEW COMPARISON:  None Available. FINDINGS: No evidence of an acute fracture or dislocation. Mild tricompartmental degenerative changes are noted. There is a very small joint effusion. Moderate to marked severity vascular calcification is seen. IMPRESSION: 1. Mild tricompartmental degenerative changes. 2. Very small joint effusion. Electronically Signed   By: Suzen Dials M.D.   On: 09/08/2024 18:58   CT Cervical Spine Wo Contrast Result Date: 09/08/2024 CLINICAL DATA:  Status post fall. EXAM: CT CERVICAL SPINE WITHOUT CONTRAST TECHNIQUE: Multidetector CT imaging of the cervical spine was performed without intravenous contrast. Multiplanar CT image reconstructions were also generated. RADIATION DOSE REDUCTION: This exam was performed according to the departmental dose-optimization program which includes automated exposure control, adjustment of the mA and/or kV according to patient size and/or use of iterative reconstruction technique. COMPARISON:  October 28, 2009 FINDINGS: Alignment: There is approximately 1 mm anterolisthesis of the C4 vertebral body on C5. Skull base and vertebrae: No acute fracture. No primary bone lesion or focal pathologic process. Soft tissues and spinal canal: No prevertebral fluid or swelling. No visible canal hematoma. Disc levels: Marked severity endplate sclerosis, mild to moderate severity anterior osteophyte formation and moderate to marked severity posterior bony spurring are seen at the levels of C5-C6 and  C6-C7. There is marked severity intervertebral disc space narrowing at the levels of C5-C6, C6-C7 and C7-T1. Bilateral moderate to marked severity multilevel facet joint hypertrophy is noted. Upper chest: Negative. Other: None. IMPRESSION: 1. No acute cervical spine fracture. 2. Marked severity multilevel degenerative changes, most prominent at the levels of C5-C6 and C6-C7. Electronically Signed   By: Suzen Dials M.D.   On: 09/08/2024 18:55   DG Chest 2 View Result Date: 09/08/2024 CLINICAL DATA:  Status post fall. EXAM: CHEST - 2 VIEW COMPARISON:  November 25, 2022 FINDINGS: Multiple sternal wires and vascular clips are seen. The heart size and mediastinal contours are within normal  limits. There is marked severity calcification of the aortic arch. Both lungs are clear. Radiopaque surgical clips are seen within the right upper quadrant. The visualized skeletal structures are unremarkable. IMPRESSION: 1. Evidence of prior median sternotomy/CABG. 2. No active cardiopulmonary disease. Electronically Signed   By: Suzen Dials M.D.   On: 09/08/2024 18:51   CT Head Wo Contrast Result Date: 09/08/2024 CLINICAL DATA:  Status post fall. EXAM: CT HEAD WITHOUT CONTRAST TECHNIQUE: Contiguous axial images were obtained from the base of the skull through the vertex without intravenous contrast. RADIATION DOSE REDUCTION: This exam was performed according to the departmental dose-optimization program which includes automated exposure control, adjustment of the mA and/or kV according to patient size and/or use of iterative reconstruction technique. COMPARISON:  None Available. FINDINGS: Brain: There is mild cerebral atrophy with widening of the extra-axial spaces and ventricular dilatation. There are areas of decreased attenuation within the white matter tracts of the supratentorial brain, consistent with microvascular disease changes. Vascular: Moderate severity bilateral cavernous carotid artery calcification is  noted. Skull: Normal. Negative for fracture or focal lesion. Sinuses/Orbits: No acute finding. Other: Moderate severity right pterion scalp soft tissue swelling is seen. IMPRESSION: 1. No acute intracranial abnormality. 2. Generalized cerebral atrophy with chronic white matter small vessel ischemic changes. 3. Moderate severity right pterion scalp soft tissue swelling. Electronically Signed   By: Suzen Dials M.D.   On: 09/08/2024 18:49     Procedures   Medications Ordered in the ED - No data to display                                  Medical Decision Making Amount and/or Complexity of Data Reviewed Radiology: ordered.   This patient presents to the ED for concern of fall, this involves an extensive number of treatment options, and is a complaint that carries with it a high risk of complications and morbidity.  The differential diagnosis includes multiple trauma   Co morbidities that complicate the patient evaluation  dm2, osteopenia, htn, hld, and anxiety   Additional history obtained:  Additional history obtained from epic chart review  Imaging Studies ordered:  I ordered imaging studies including ct head/ct cervical spine, cxr, knee, foot  I independently visualized and interpreted imaging which showed  CT head: No acute intracranial abnormality.  2. Generalized cerebral atrophy with chronic white matter small  vessel ischemic changes.  3. Moderate severity right pterion scalp soft tissue swelling.  CT cervical spine: 1. No acute cervical spine fracture.  2. Marked severity multilevel degenerative changes, most prominent  at the levels of C5-C6 and C6-C7.  CXR: . Evidence of prior median sternotomy/CABG.  2. No active cardiopulmonary disease.  R knee: 1. Mild tricompartmental degenerative changes.  2. Very small joint effusion.  L foot: Mild degenerative changes without evidence of acute fracture.  I agree with the radiologist interpretation   Medicines  ordered and prescription drug management:  I have reviewed the patients home medicines and have made adjustments as needed   Test Considered:  ct   Problem List / ED Course:  Fall with right forehead contusion:  no internal injury.  Pt is able to ambulate.  She is stable for d/c.  Return if worse.  F/u with pcp.   Reevaluation:  After the interventions noted above, I reevaluated the patient and found that they have :improved   Social Determinants of Health:  Lives at home  Dispostion:  After consideration of the diagnostic results and the patients response to treatment, I feel that the patent would benefit from discharge with outpatient f/u.       Final diagnoses:  Fall, initial encounter  Traumatic cephalohematoma, initial encounter    ED Discharge Orders     None          Dean Clarity, MD 09/08/24 [phone]

## 2024-09-08 NOTE — ED Triage Notes (Signed)
 Pt reports she tripped over the cord to her answering machine and fell.  Pt has hematoma to right side temple area and is concerned because she takes a baby aspirin  daily.  Pt denies LOC or dizziness.

## 2024-09-09 DIAGNOSIS — S0083XA Contusion of other part of head, initial encounter: Secondary | ICD-10-CM | POA: Diagnosis not present

## 2024-09-10 ENCOUNTER — Other Ambulatory Visit (HOSPITAL_COMMUNITY)
Admission: RE | Admit: 2024-09-10 | Discharge: 2024-09-10 | Disposition: A | Discharge status: Home / Self Care | Source: Ambulatory Visit | Attending: Obstetrics and Gynecology | Admitting: Obstetrics and Gynecology

## 2024-09-10 ENCOUNTER — Encounter: Payer: Self-pay | Admitting: Obstetrics and Gynecology

## 2024-09-10 ENCOUNTER — Ambulatory Visit: Admitting: Obstetrics and Gynecology

## 2024-09-10 VITALS — BP 124/64 | HR 82 | Temp 98.2°F | Wt 153.0 lb

## 2024-09-10 DIAGNOSIS — Z124 Encounter for screening for malignant neoplasm of cervix: Secondary | ICD-10-CM

## 2024-09-10 DIAGNOSIS — N950 Postmenopausal bleeding: Secondary | ICD-10-CM

## 2024-09-10 DIAGNOSIS — B3731 Acute candidiasis of vulva and vagina: Secondary | ICD-10-CM | POA: Diagnosis not present

## 2024-09-10 MED ORDER — FLUCONAZOLE 150 MG PO TABS: 150.0000 mg | ORAL_TABLET | Freq: Once | ORAL | 0 refills | Status: AC

## 2024-09-10 NOTE — Progress Notes (Signed)
 78 y.o. [id number] female here for PMB. Divorced.  No LMP recorded (lmp unknown). Patient is postmenopausal.   She reports vaginal bleeding 4 days ago. She was seen in the ED on 09/06/24. Pelvic US  could not identify the uterus.  09/06/24 TVUS: IMPRESSION: Uterus and ovaries are not visualized. However, patient does not report history of hysterectomy. CT or MRI scan is recommended for further evaluation.  She had more bleeding two days ago, seen bleeding this morning. Only happens with laying down. Small amount of BRB.  GYN HISTORY: No sig hx  OB History  Gravida Para Term Preterm AB Living  2 2 2   2   SAB IAB Ectopic Multiple Live Births      2    # Outcome Date GA Lbr Len/2nd Weight Sex Type Anes PTL Lv  2 Term      Vag-Spont   LIV  1 Term      Vag-Spont   LIV   Past Medical History:  Diagnosis Date   Anxiety    Breast lump 08/2017   [facility]    Chronic cystitis    Diabetes mellitus    type 2   Elevated cholesterol    Heart murmur    Aortic valve stenosis.  Sees cardiologist with [facility], no current problems per patient   Hypertension    Mild dysplasia of cervix (CIN I)    Osteopenia 02/2018   T score -1.8 FRAX 11% / 2.2%   Past Surgical History:  Procedure Laterality Date   BREAST BIOPSY Left    CATARACT EXTRACTION Bilateral    x2    CERVICAL BIOPSY  W/ LOOP ELECTRODE EXCISION  2009   CHOLECYSTECTOMY     colonoscopy     COLPOSCOPY     CORONARY ARTERY BYPASS GRAFT N/A 09/20/2022   Procedure: CORONARY ARTERY BYPASS GRAFTING (CABG) X3 USING ENDOSCOPICALLY HARVESTED RIGHT GREATER SAPHENOUS VEIN;  Surgeon: Murriel Toribio DEL, MD;  Location: [facility] OR;  Service: Open Heart Surgery;  Laterality: N/A;   DILATION AND CURETTAGE OF UTERUS     IR THORACENTESIS ASP PLEURAL SPACE W/IMG GUIDE  10/08/2022   LEFT HEART CATH AND CORONARY ANGIOGRAPHY N/A 09/01/2022   Procedure: LEFT HEART CATH AND CORONARY ANGIOGRAPHY;  Surgeon: Claudene Victory ORN, MD;   Location: [facility] INVASIVE CV LAB;  Service: Cardiovascular;  Laterality: N/A;   TEE WITHOUT CARDIOVERSION N/A 09/20/2022   Procedure: TRANSESOPHAGEAL ECHOCARDIOGRAM (TEE);  Surgeon: Murriel Toribio DEL, MD;  Location: [facility] OR;  Service: Open Heart Surgery;  Laterality: N/A;   TUBAL LIGATION     vocal cord surgery     nodules excised   Current Outpatient Medications on File Prior to Visit  Medication Sig Dispense Refill   aspirin  EC (ASPIRIN  LOW DOSE) 81 MG tablet TAKE 1 TABLET BY MOUTH EVERY DAY 90 tablet 2   atorvastatin  (LIPITOR ) 80 MG tablet TAKE 1 TABLET BY MOUTH EVERY DAY 90 tablet 3   Cholecalciferol (VITAMIN D3) 2000 units TABS Take 2,000 Units by mouth every morning.     diphenhydrAMINE (BENADRYL) 25 mg capsule Take 25 mg by mouth every 6 (six) hours as needed for allergies.     docusate sodium  (COLACE) 100 MG capsule Take 100 mg by mouth daily.     empagliflozin  (JARDIANCE ) 25 MG TABS tablet Take 25 mg by mouth daily.     metFORMIN  (GLUCOPHAGE ) 1000 MG tablet Take 1,000 mg by mouth 2 (two) times daily.     metoprolol  tartrate (LOPRESSOR )  25 MG tablet TAKE 1 TABLET BY MOUTH TWICE A DAY 180 tablet 3   sitaGLIPtin (JANUVIA) 100 MG tablet Take 100 mg by mouth daily.     No current facility-administered medications on file prior to visit.   Allergies  Allergen Reactions   Bee Venom Hives   Farxiga [Dapagliflozin] Other (See Comments)    Pt is unaware of allergy or reaction    Penicillins     Has patient had a PCN reaction causing immediate rash, facial/tongue/throat swelling, SOB or lightheadedness with hypotension: Yes Has patient had a PCN reaction causing severe rash involving mucus membranes or skin necrosis: Yes Has patient had a PCN reaction that required hospitalization No Has patient had a PCN reaction occurring within the last 10 years: No If all of the above answers are NO, then may proceed with Cephalosporin use.       PE Today's Vitals   09/10/24 1150  BP: 124/64   Pulse: 82  Temp: 98.2 F (36.8 C)  TempSrc: Oral  SpO2: 97%  Weight: 153 lb (69.4 kg)   Body mass index is 27.1 kg/m.  Physical Exam Vitals reviewed. Exam conducted with a chaperone present.  Constitutional:      General: She is not in acute distress.    Appearance: Normal appearance.  HENT:     Head: Normocephalic and atraumatic.     Nose: Nose normal.  Eyes:     Extraocular Movements: Extraocular movements intact.     Conjunctiva/sclera: Conjunctivae normal.  Pulmonary:     Effort: Pulmonary effort is normal.  Genitourinary:    General: Normal vulva.     Exam position: Lithotomy position.     Vagina: Normal. No vaginal discharge.     Cervix: Normal. No cervical motion tenderness, discharge or lesion.     Uterus: Normal. Not enlarged and not tender.      Adnexa: Right adnexa normal and left adnexa normal.  Musculoskeletal:        General: Normal range of motion.     Cervical back: Normal range of motion.  Neurological:     General: No focal deficit present.     Mental Status: She is alert.  Psychiatric:        Mood and Affect: Mood normal.        Behavior: Behavior normal.       Assessment and Plan:        PMB (postmenopausal bleeding) -     US  PELVIS TRANSVAGINAL NON-OB (TV ONLY); Future -     Endometrial biopsy; Future -     Urinalysis,Complete w/RFL Culture  Cervical cancer screening -     Cytology - PAP  Yeast vaginitis -     Fluconazole ; Take 1 tablet (150 mg total) by mouth once for 1 dose.  Dispense: 1 tablet; Refill: 0  Reviewed causes of PMB, including genitourinary syndrome of menopause, infection, trauma, polyps, urinary and GI etiologies, and malignancy. Recommend endometrial assessment with transvaginal ultrasound or endometrial sampling- via EMB or diagnostic hysteroscopy. Reviewed that she may still require endometrial sampling if TVUS is abnormal. Patient elects for TVUS w/ possible EMB vs hysteroscopy.    Sheila LULLA Pa, MD

## 2024-09-11 LAB — URINALYSIS, COMPLETE W/RFL CULTURE
Bilirubin Urine: NEGATIVE
Hyaline Cast: NONE SEEN /LPF
Leukocyte Esterase: NEGATIVE
Nitrites, Initial: NEGATIVE
Specific Gravity, Urine: 1.02 (ref 1.001–1.035)
pH: 5.5 (ref 5.0–8.0)

## 2024-09-11 LAB — URINE CULTURE
MICRO NUMBER:: 17060670
SPECIMEN QUALITY:: ADEQUATE

## 2024-09-11 LAB — CULTURE INDICATED

## 2024-09-12 ENCOUNTER — Ambulatory Visit: Payer: Self-pay | Admitting: Obstetrics and Gynecology

## 2024-09-12 DIAGNOSIS — I25119 Atherosclerotic heart disease of native coronary artery with unspecified angina pectoris: Secondary | ICD-10-CM | POA: Diagnosis not present

## 2024-09-12 DIAGNOSIS — E1165 Type 2 diabetes mellitus with hyperglycemia: Secondary | ICD-10-CM | POA: Diagnosis not present

## 2024-09-12 DIAGNOSIS — Z951 Presence of aortocoronary bypass graft: Secondary | ICD-10-CM | POA: Diagnosis not present

## 2024-09-12 DIAGNOSIS — S0093XA Contusion of unspecified part of head, initial encounter: Secondary | ICD-10-CM | POA: Diagnosis not present

## 2024-09-12 DIAGNOSIS — E785 Hyperlipidemia, unspecified: Secondary | ICD-10-CM | POA: Diagnosis not present

## 2024-09-12 DIAGNOSIS — I10 Essential (primary) hypertension: Secondary | ICD-10-CM | POA: Diagnosis not present

## 2024-09-12 DIAGNOSIS — W19XXXA Unspecified fall, initial encounter: Secondary | ICD-10-CM | POA: Diagnosis not present

## 2024-09-12 LAB — CYTOLOGY - PAP: Diagnosis: NEGATIVE

## 2024-09-13 ENCOUNTER — Other Ambulatory Visit

## 2024-09-13 ENCOUNTER — Encounter: Payer: Self-pay | Admitting: Obstetrics and Gynecology

## 2024-09-13 ENCOUNTER — Ambulatory Visit: Admitting: Obstetrics and Gynecology

## 2024-09-13 DIAGNOSIS — N950 Postmenopausal bleeding: Secondary | ICD-10-CM

## 2024-09-13 NOTE — Progress Notes (Signed)
 78 y.o. [id number] female here for f/u PMB. Divorced.  No LMP recorded (lmp unknown). Patient is postmenopausal.   At 09/10/24 appt, she reported: She reports vaginal bleeding 4 days ago. She was seen in the ED on 09/06/24. She had more bleeding two days ago, seen bleeding this morning. Only happens with laying down. Small amount of BRB.  09/06/24 TVUS: IMPRESSION: Uterus and ovaries are not visualized. However, patient does not report history of hysterectomy. CT or MRI scan is recommended for further evaluation.  Yeast infection was diagnosed at her last appointment, and she was treated with Diflucan .  She has not seen any further bleeding since her last appointment.  GYN HISTORY: No sig hx  OB History  Gravida Para Term Preterm AB Living  2 2 2   2   SAB IAB Ectopic Multiple Live Births      2    # Outcome Date GA Lbr Len/2nd Weight Sex Type Anes PTL Lv  2 Term      Vag-Spont   LIV  1 Term      Vag-Spont   LIV   Past Medical History:  Diagnosis Date   Anxiety    Breast lump 08/2017   no cancer/ [facility] [place]   Chronic cystitis    Diabetes mellitus    type 2   Elevated cholesterol    Heart murmur    Aortic valve stenosis.  Sees cardiologist with [facility], no current problems per patient   Hypertension    Mild dysplasia of cervix (CIN I)    Osteopenia 02/2018   T score -1.8 FRAX 11% / 2.2%   Past Surgical History:  Procedure Laterality Date   BREAST BIOPSY Left    CATARACT EXTRACTION Bilateral    x2    CERVICAL BIOPSY  W/ LOOP ELECTRODE EXCISION  2009   CHOLECYSTECTOMY     colonoscopy     COLPOSCOPY     CORONARY ARTERY BYPASS GRAFT N/A 09/20/2022   Procedure: CORONARY ARTERY BYPASS GRAFTING (CABG) X3 USING ENDOSCOPICALLY HARVESTED RIGHT GREATER SAPHENOUS VEIN;  Surgeon: Murriel Toribio DEL, MD;  Location: [facility] OR;  Service: Open Heart Surgery;  Laterality: N/A;   DILATION AND CURETTAGE OF UTERUS     IR THORACENTESIS ASP PLEURAL SPACE W/IMG GUIDE  10/08/2022    LEFT HEART CATH AND CORONARY ANGIOGRAPHY N/A 09/01/2022   Procedure: LEFT HEART CATH AND CORONARY ANGIOGRAPHY;  Surgeon: Claudene Victory ORN, MD;  Location: [facility] INVASIVE CV LAB;  Service: Cardiovascular;  Laterality: N/A;   TEE WITHOUT CARDIOVERSION N/A 09/20/2022   Procedure: TRANSESOPHAGEAL ECHOCARDIOGRAM (TEE);  Surgeon: Murriel Toribio DEL, MD;  Location[facility] OR;  Service: Open Heart Surgery;  Laterality: N/A;   TUBAL LIGATION     vocal cord surgery     nodules excised   Current Outpatient Medications on File Prior to Visit  Medication Sig Dispense Refill   aspirin  EC (ASPIRIN  LOW DOSE) 81 MG tablet TAKE 1 TABLET BY MOUTH EVERY DAY 90 tablet 2   atorvastatin  (LIPITOR ) 80 MG tablet TAKE 1 TABLET BY MOUTH EVERY DAY 90 tablet 3   Cholecalciferol (VITAMIN D3) 2000 units TABS Take 2,000 Units by mouth every morning.     diphenhydrAMINE (BENADRYL) 25 mg capsule Take 25 mg by mouth every 6 (six) hours as needed for allergies.     docusate sodium  (COLACE) 100 MG capsule Take 100 mg by mouth daily.     empagliflozin  (JARDIANCE ) 25 MG TABS tablet Take 25 mg by mouth daily.  metFORMIN  (GLUCOPHAGE ) 1000 MG tablet Take 1,000 mg by mouth 2 (two) times daily.     metoprolol  tartrate (LOPRESSOR ) 25 MG tablet TAKE 1 TABLET BY MOUTH TWICE A DAY 180 tablet 3   sitaGLIPtin (JANUVIA) 100 MG tablet Take 100 mg by mouth daily.     No current facility-administered medications on file prior to visit.   Allergies  Allergen Reactions   Bee Venom Hives   Farxiga [Dapagliflozin] Other (See Comments)    Pt is unaware of allergy or reaction    Penicillins     Has patient had a PCN reaction causing immediate rash, facial/tongue/throat swelling, SOB or lightheadedness with hypotension: Yes Has patient had a PCN reaction causing severe rash involving mucus membranes or skin necrosis: Yes Has patient had a PCN reaction that required hospitalization No Has patient had a PCN reaction occurring within the last 10 years:  No If all of the above answers are NO, then may proceed with Cephalosporin use.       PE Today's Vitals   09/13/24 1416  BP: 122/72  Pulse: 76  Temp: 98.1 F (36.7 C)  TempSrc: Oral  SpO2: 97%  Weight: 153 lb (69.4 kg)   Body mass index is 27.1 kg/m.  Physical Exam Vitals reviewed.  Constitutional:      General: She is not in acute distress.    Appearance: Normal appearance.  HENT:     Head: Normocephalic and atraumatic.     Nose: Nose normal.  Eyes:     Extraocular Movements: Extraocular movements intact.     Conjunctiva/sclera: Conjunctivae normal.  Pulmonary:     Effort: Pulmonary effort is normal.  Musculoskeletal:        General: Normal range of motion.     Cervical back: Normal range of motion.  Neurological:     General: No focal deficit present.     Mental Status: She is alert.  Psychiatric:        Mood and Affect: Mood normal.        Behavior: Behavior normal.     09/13/24 TVUS: Indications: Postmenopausal bleeding, known fibroids Comparison: 05/12/2016  Findings:   Uterus: 5.2 x 4.2 x 3.5 cm, retroverted uterus.  6 fibroids measured-intramural and subserosal.  The largest fibroid measured 2.3 cm. Endometrial thickness: 2.4 mm, avascular. Left ovary: Not visualized. Right ovary: Not visualized. No free fluid.  Impression:  Multifibroid uterus, stable when compared to 2017 TVUS.  Vera LULLA Pa, MD    Assessment and Plan:        PMB (postmenopausal bleeding) -     Endometrial biopsy  TVUS reviewed with patient with thin endometrial stripe. No endometrial indication for endometrial sampling Discussed vaginal bleeding may have been related to prior yeast infection or vaginal atrophy Patient to follow-up with any further bleeding.  Vera LULLA Pa, MD

## 2024-09-17 ENCOUNTER — Ambulatory Visit: Admitting: Obstetrics and Gynecology

## 2024-09-17 ENCOUNTER — Other Ambulatory Visit: Payer: Self-pay | Admitting: Family Medicine

## 2024-09-17 DIAGNOSIS — Z1231 Encounter for screening mammogram for malignant neoplasm of breast: Secondary | ICD-10-CM

## 2024-09-18 ENCOUNTER — Telehealth: Payer: Self-pay | Admitting: *Deleted

## 2024-09-18 NOTE — Telephone Encounter (Signed)
 Spoke with patient. Reports red and black blood on tissue when wiping this morning. Patient is concerned.   Seen on office on 10/6 returned 10/9 for PUS.  UA neg for infection 09/10/24. Patient states this was rechecked with PCP 09/12/24, reports no concerns. Drinking fluids, voids small amounts. Reports vaginal itching and feeling raw. Burning when urine touches skin at urethra. Took diflucan  x1 on 09/10/24. Denies odor or vaginal discharge, fever/chills, N/V, flank pain.   Patient lives an hour away, would need time to plan travel for appt.   Advised I will review with Dr. Dallie and f/u, patient agreeable.   Dr. Dallie -please review.

## 2024-09-18 NOTE — Telephone Encounter (Signed)
 Spoke with patient, advised per Dr. Dallie. Patient request OV with Dr. Dallie, next available. Scheduled for 10/15 at 1330. States she will try hydrocortisone cream, does not want to try Vaseline.   Patient appreciative of call.   Encounter closed.

## 2024-09-19 ENCOUNTER — Encounter: Payer: Self-pay | Admitting: Obstetrics and Gynecology

## 2024-09-19 ENCOUNTER — Ambulatory Visit: Admitting: Obstetrics and Gynecology

## 2024-09-19 VITALS — BP 124/64 | HR 77 | Temp 98.0°F | Wt 153.0 lb

## 2024-09-19 DIAGNOSIS — B3731 Acute candidiasis of vulva and vagina: Secondary | ICD-10-CM | POA: Diagnosis not present

## 2024-09-19 DIAGNOSIS — N950 Postmenopausal bleeding: Secondary | ICD-10-CM | POA: Diagnosis not present

## 2024-09-19 DIAGNOSIS — R300 Dysuria: Secondary | ICD-10-CM | POA: Diagnosis not present

## 2024-09-19 DIAGNOSIS — N958 Other specified menopausal and perimenopausal disorders: Secondary | ICD-10-CM

## 2024-09-19 DIAGNOSIS — N898 Other specified noninflammatory disorders of vagina: Secondary | ICD-10-CM

## 2024-09-19 LAB — WET PREP FOR TRICH, YEAST, CLUE

## 2024-09-19 MED ORDER — FLUCONAZOLE 150 MG PO TABS: 150.0000 mg | ORAL_TABLET | Freq: Once | ORAL | 2 refills | Status: AC

## 2024-09-19 MED ORDER — ESTRADIOL 0.01 % VA CREA: TOPICAL_CREAM | VAGINAL | 1 refills | Status: AC

## 2024-09-19 NOTE — Progress Notes (Signed)
 78 y.o. [id number] female here for f/u PMB. Divorced. Works at nursing home.  No LMP recorded (lmp unknown). Patient is postmenopausal.   At 09/10/24 appt, she reported: She reports vaginal bleeding 4 days ago. She was seen in the ED on 09/06/24. She had more bleeding two days ago, seen bleeding this morning. Only happens with laying down. Small amount of BRB. Yeast infection was diagnosed at her last appointment, and she was treated with Diflucan .  Yesterday morning seen black blood and then red blood. This morning a little red blood when wiped.  Reports vaginal itching and feeling raw. Burning when urine touches skin at urethra.   09/13/24 TVUS: Indications: Postmenopausal bleeding, known fibroids Comparison: 05/12/2016   Findings:    Uterus: 5.2 x 4.2 x 3.5 cm, retroverted uterus.  6 fibroids measured-intramural and subserosal.  The largest fibroid measured 2.3 cm. Endometrial thickness: 2.4 mm, avascular. Left ovary: Not visualized. Right ovary: Not visualized. No free fluid.   Impression:  Multifibroid uterus, stable when compared to 2017 TVUS.   Vera LULLA Pa, MD  GYN HISTORY: No sig hx  OB History  Gravida Para Term Preterm AB Living  2 2 2   2   SAB IAB Ectopic Multiple Live Births      2    # Outcome Date GA Lbr Len/2nd Weight Sex Type Anes PTL Lv  2 Term      Vag-Spont   LIV  1 Term      Vag-Spont   LIV   Past Medical History:  Diagnosis Date   Anxiety    Breast lump 08/2017   no cancer/ [facility]   Chronic cystitis    Diabetes mellitus    type 2   Elevated cholesterol    Heart murmur    Aortic valve stenosis.  Sees cardiologist with [facility], no current problems per patient   Hypertension    Mild dysplasia of cervix (CIN I)    Osteopenia 02/2018   T score -1.8 FRAX 11% / 2.2%   Past Surgical History:  Procedure Laterality Date   BREAST BIOPSY Left    CATARACT EXTRACTION Bilateral    x2    CERVICAL BIOPSY  W/ LOOP ELECTRODE  EXCISION  2009   CHOLECYSTECTOMY     colonoscopy     COLPOSCOPY     CORONARY ARTERY BYPASS GRAFT N/A 09/20/2022   Procedure: CORONARY ARTERY BYPASS GRAFTING (CABG) X3 USING ENDOSCOPICALLY HARVESTED RIGHT GREATER SAPHENOUS VEIN;  Surgeon: Murriel Toribio DEL, MD;  Location: [facility] OR;  Service: Open Heart Surgery;  Laterality: N/A;   DILATION AND CURETTAGE OF UTERUS     IR THORACENTESIS ASP PLEURAL SPACE W/IMG GUIDE  10/08/2022   LEFT HEART CATH AND CORONARY ANGIOGRAPHY N/A 09/01/2022   Procedure: LEFT HEART CATH AND CORONARY ANGIOGRAPHY;  Surgeon: Claudene Victory ORN, MD;  Location: [facility] INVASIVE CV LAB;  Service: Cardiovascular;  Laterality: N/A;   TEE WITHOUT CARDIOVERSION N/A 09/20/2022   Procedure: TRANSESOPHAGEAL ECHOCARDIOGRAM (TEE);  Surgeon: Murriel Toribio DEL, MD;  Location: [facility] OR;  Service: Open Heart Surgery;  Laterality: N/A;   TUBAL LIGATION     vocal cord surgery     nodules excised   Current Outpatient Medications on File Prior to Visit  Medication Sig Dispense Refill   aspirin  EC (ASPIRIN  LOW DOSE) 81 MG tablet TAKE 1 TABLET BY MOUTH EVERY DAY 90 tablet 2   atorvastatin  (LIPITOR ) 80 MG tablet TAKE 1 TABLET BY MOUTH EVERY DAY 90 tablet 3  Cholecalciferol (VITAMIN D3) 2000 units TABS Take 2,000 Units by mouth every morning.     diphenhydrAMINE (BENADRYL) 25 mg capsule Take 25 mg by mouth every 6 (six) hours as needed for allergies.     docusate sodium  (COLACE) 100 MG capsule Take 100 mg by mouth daily.     empagliflozin  (JARDIANCE ) 25 MG TABS tablet Take 25 mg by mouth daily.     metFORMIN  (GLUCOPHAGE ) 1000 MG tablet Take 1,000 mg by mouth 2 (two) times daily.     metoprolol  tartrate (LOPRESSOR ) 25 MG tablet TAKE 1 TABLET BY MOUTH TWICE A DAY 180 tablet 3   sitaGLIPtin (JANUVIA) 100 MG tablet Take 100 mg by mouth daily.     No current facility-administered medications on file prior to visit.   Allergies  Allergen Reactions   Bee Venom Hives   Farxiga [Dapagliflozin] Other (See Comments)     Pt is unaware of allergy or reaction    Penicillins     Has patient had a PCN reaction causing immediate rash, facial/tongue/throat swelling, SOB or lightheadedness with hypotension: Yes Has patient had a PCN reaction causing severe rash involving mucus membranes or skin necrosis: Yes Has patient had a PCN reaction that required hospitalization No Has patient had a PCN reaction occurring within the last 10 years: No If all of the above answers are NO, then may proceed with Cephalosporin use.       PE Today's Vitals   09/19/24 1327  BP: 124/64  Pulse: 77  Temp: 98 F (36.7 C)  TempSrc: Oral  SpO2: 99%  Weight: 153 lb (69.4 kg)   Body mass index is 27.1 kg/m.  Physical Exam Vitals reviewed.  Constitutional:      General: She is not in acute distress.    Appearance: Normal appearance.  HENT:     Head: Normocephalic and atraumatic.     Nose: Nose normal.  Eyes:     Extraocular Movements: Extraocular movements intact.     Conjunctiva/sclera: Conjunctivae normal.  Pulmonary:     Effort: Pulmonary effort is normal.  Musculoskeletal:        General: Normal range of motion.     Cervical back: Normal range of motion.  Neurological:     General: No focal deficit present.     Mental Status: She is alert.  Psychiatric:        Mood and Affect: Mood normal.        Behavior: Behavior normal.     09/19/24 TVUS: Indications: Postmenopausal bleeding, known fibroids Comparison: 05/12/2016  Findings:   Uterus: 5.2 x 4.2 x 3.5 cm, retroverted uterus.  6 fibroids measured-intramural and subserosal.  The largest fibroid measured 2.3 cm. Endometrial thickness: 2.4 mm, avascular. Left ovary: Not visualized. Right ovary: Not visualized. No free fluid.  Impression:  Multifibroid uterus, stable when compared to 2017 TVUS.  Vera LULLA Pa, MD    Assessment and Plan:        PMB (postmenopausal bleeding) TVUS reviewed with patient with thin endometrial stripe. No  endometrial indication for endometrial sampling Discussed vaginal bleeding may have been related to recurring yeast infection or vaginal atrophy Will treat both. Pt to f/u in 3 month  Genitourinary syndrome of menopause -     Estradiol; Apply 0.5g to vulva nightly for 2 weeks then 2 times a week.  Dispense: 42.5 g; Refill: 1  Dysuria -     Urinalysis,Complete w/RFL Culture  Yeast vaginitis -     Fluconazole ; Take 1 tablet (  150 mg total) by mouth once for 1 dose.  Dispense: 1 tablet; Refill: 2  Vaginal irritation -     WET PREP FOR TRICH, YEAST, CLUE     Vera LULLA Pa, MD

## 2024-09-20 ENCOUNTER — Ambulatory Visit: Payer: Self-pay | Admitting: Obstetrics and Gynecology

## 2024-09-20 DIAGNOSIS — Z23 Encounter for immunization: Secondary | ICD-10-CM | POA: Diagnosis not present

## 2024-09-21 LAB — URINE CULTURE
MICRO NUMBER:: 17102512
SPECIMEN QUALITY:: ADEQUATE

## 2024-09-21 LAB — URINALYSIS, COMPLETE W/RFL CULTURE
Bacteria, UA: NONE SEEN /HPF
Bilirubin Urine: NEGATIVE
Hgb urine dipstick: NEGATIVE
Hyaline Cast: NONE SEEN /LPF
Ketones, ur: NEGATIVE
Leukocyte Esterase: NEGATIVE
Nitrites, Initial: NEGATIVE
Protein, ur: NEGATIVE
RBC / HPF: NONE SEEN /HPF (ref 0–2)
Specific Gravity, Urine: 1.015 (ref 1.001–1.035)
pH: 5 (ref 5.0–8.0)

## 2024-09-21 LAB — CULTURE INDICATED

## 2024-09-25 DIAGNOSIS — Z23 Encounter for immunization: Secondary | ICD-10-CM | POA: Diagnosis not present

## 2024-10-08 ENCOUNTER — Ambulatory Visit (HOSPITAL_COMMUNITY)
Admission: RE | Admit: 2024-10-08 | Discharge: 2024-10-08 | Disposition: A | Discharge status: Home / Self Care | Source: Ambulatory Visit | Attending: Cardiology | Admitting: Cardiology

## 2024-10-08 DIAGNOSIS — I350 Nonrheumatic aortic (valve) stenosis: Secondary | ICD-10-CM | POA: Insufficient documentation

## 2024-10-08 LAB — ECHOCARDIOGRAM COMPLETE
AR max vel: 0.93 cm2
AV Area VTI: 0.96 cm2
AV Area mean vel: 1.01 cm2
AV Mean grad: 22.2 mmHg
AV Peak grad: 46.7 mmHg
Ao pk vel: 3.42 m/s
Area-P 1/2: 2.88 cm2
P 1/2 time: 259 ms
S' Lateral: 1.8 cm

## 2024-10-09 ENCOUNTER — Ambulatory Visit: Payer: Self-pay | Admitting: Cardiology

## 2024-10-10 ENCOUNTER — Telehealth: Payer: Self-pay | Admitting: Cardiology

## 2024-10-10 NOTE — Telephone Encounter (Signed)
 Sheila Lonni CROME, MD Result Note Worsening aortic stenosis, now moderate to severe.  Can we add her on to my schedule 11/18 to discuss? ECHOCARDIOGRAM COMPLETE   Discussed results with patient. She will keep appointment on 11/17.

## 2024-10-10 NOTE — Telephone Encounter (Signed)
 Per dr kate not schd patient for 11/17 but patient still wants the nurse to give her call about her results. Please adivse

## 2024-10-21 NOTE — Progress Notes (Unsigned)
 Cardiology Office Note:    Date:  10/22/2024   ID:  Sheila Keller, DOB 1946/11/21, MRN [id number]  PCP:  Sun, Vyvyan, MD  Cardiologist:  Lonni LITTIE Nanas, MD  Electrophysiologist:  None   Referring MD: Sun, Vyvyan, MD   Chief Complaint  Patient presents with   Aortic Stenosis    History of Present Illness:    Sheila Keller is a 78 y.o. female with a hx of CAD status post CABG, aortic stenosis, hypertension, diabetes, hyperlipidemia who presents for follow-up. TTE on 03/05/2020 showed hyperdynamic LV systolic function, mild concentric LVH, grade 1 diastolic dysfunction, normal RV function, normal PASP, mild to moderate aortic stenosis.  Echocardiogram 03/17/2021 showed LVEF 70 to 75%, grade 1 diastolic dysfunction, normal RV function, moderate aortic stenosis.  Echocardiogram 03/30/2022 showed EF 70 to 75%, grade 1 diastolic dysfunction, LV mid cavitary gradient 22 mmHg (43 mmHg with Valsalva), normal RV function, moderate aortic stenosis (V-max 3 m/s, mean gradient 24 mmHg, AVA 0.8 cm, DI 0.34).  Cardiac PET scan on 08/24/2022 showed high risk findings, including lateral ischemia, decreased EF with stress, TID, severe three-vessel coronary calcifications, though absolute blood flows were normal.  LHC 09/01/2022 showed 80% distal left main stenosis.  She underwent CABG x3 on 09/20/2022 (LIMA to LAD, SVG-OM1, SVG-OM 2).  Echocardiogram 04/06/2023 showed EF 65 to 70%, grade 1 diastolic dysfunction, normal RV function, moderate aortic stenosis.  Echocardiogram 04/2024 showed moderate to severe aortic stenosis (V-max 3.1 m/s, mean gradient 23 mmHg, AVA 0.8 cm, DI 0.28).  Echocardiogram 10/2024 showed severe aortic stenosis (V-max 3.6 m/s, mean gradient 29 mmHg, AVA 0.8 cm, DI 0.21).   Since last clinic visit, she reports she has been having shortness of breath.  States that when she goes for a walk or mops her floor she will feel short of breath.  States that walking up flight of stairs will  feel short of breath.  She denies any chest pain.  She denies any lightheadedness or syncope.   Wt Readings from Last 3 Encounters:  10/22/24 154 lb 6.4 oz (70 kg)  09/19/24 153 lb (69.4 kg)  09/13/24 153 lb (69.4 kg)   BP Readings from Last 3 Encounters:  10/22/24 134/72  09/19/24 124/64  09/13/24 122/72      Past Medical History:  Diagnosis Date   Anxiety    Breast lump 08/2017   no cancer/ [facility]   Chronic cystitis    Diabetes mellitus    type 2   Elevated cholesterol    Heart murmur    Aortic valve stenosis.  Sees cardiologist with [facility], no current problems per patient   Hypertension    Mild dysplasia of cervix (CIN I)    Osteopenia 02/2018   T score -1.8 FRAX 11% / 2.2%    Past Surgical History:  Procedure Laterality Date   BREAST BIOPSY Left    CATARACT EXTRACTION Bilateral    x2    CERVICAL BIOPSY  W/ LOOP ELECTRODE EXCISION  2009   CHOLECYSTECTOMY     colonoscopy     COLPOSCOPY     CORONARY ARTERY BYPASS GRAFT N/A 09/20/2022   Procedure: CORONARY ARTERY BYPASS GRAFTING (CABG) X3 USING ENDOSCOPICALLY HARVESTED RIGHT GREATER SAPHENOUS VEIN;  Surgeon: Murriel Toribio DEL, MD;  Location: [facility] OR;  Service: Open Heart Surgery;  Laterality: N/A;   DILATION AND CURETTAGE OF UTERUS     IR THORACENTESIS ASP PLEURAL SPACE W/IMG GUIDE  10/08/2022   LEFT HEART CATH  AND CORONARY ANGIOGRAPHY N/A 09/01/2022   Procedure: LEFT HEART CATH AND CORONARY ANGIOGRAPHY;  Surgeon: Claudene Victory ORN, MD;  Location: [facility] INVASIVE CV LAB;  Service: Cardiovascular;  Laterality: N/A;   TEE WITHOUT CARDIOVERSION N/A 09/20/2022   Procedure: TRANSESOPHAGEAL ECHOCARDIOGRAM (TEE);  Surgeon: Murriel Toribio DEL, MD;  Location: [facility] OR;  Service: Open Heart Surgery;  Laterality: N/A;   TUBAL LIGATION     vocal cord surgery     nodules excised    Current Medications: Current Meds  Medication Sig   aspirin  EC (ASPIRIN  LOW DOSE) 81 MG tablet TAKE 1 TABLET BY MOUTH EVERY DAY    atorvastatin  (LIPITOR ) 80 MG tablet TAKE 1 TABLET BY MOUTH EVERY DAY   Cholecalciferol (VITAMIN D3) 2000 units TABS Take 2,000 Units by mouth every morning.   diphenhydrAMINE (BENADRYL) 25 mg capsule Take 25 mg by mouth every 6 (six) hours as needed for allergies.   docusate sodium  (COLACE) 100 MG capsule Take 100 mg by mouth daily.   empagliflozin  (JARDIANCE ) 25 MG TABS tablet Take 25 mg by mouth daily.   estradiol (ESTRACE) 0.01 % CREA vaginal cream Apply 0.5g to vulva nightly for 2 weeks then 2 times a week.   metFORMIN  (GLUCOPHAGE ) 1000 MG tablet Take 1,000 mg by mouth 2 (two) times daily.   metoprolol  tartrate (LOPRESSOR ) 25 MG tablet TAKE 1 TABLET BY MOUTH TWICE A DAY   sitaGLIPtin (JANUVIA) 100 MG tablet Take 100 mg by mouth daily.     Allergies:   Bee venom, Farxiga [dapagliflozin], and Penicillins   Social History   Socioeconomic History   Marital status: Divorced    Spouse name: Not on file   Number of children: Not on file   Years of education: Not on file   Highest education level: Not on file  Occupational History   Not on file  Tobacco Use   Smoking status: Never   Smokeless tobacco: Never  Vaping Use   Vaping status: Never Used  Substance and Sexual Activity   Alcohol use: No    Alcohol/week: 0.0 standard drinks of alcohol   Drug use: No   Sexual activity: Not Currently    Birth control/protection: Post-menopausal, Surgical    Comment: First sexual encounter at age 60. Less than 5 partners in her life.  Tubal Ligation  Other Topics Concern   Not on file  Social History Narrative   Not on file   Social Drivers of Health   Financial Resource Strain: Not on file  Food Insecurity: Low Risk  (11/21/2023)   Received from [facility]   Hunger Vital Sign    Within the past 12 months, the food you bought just didn't last and you didn't have money to get more. : Never true    Within the past 12 months, you worried that your food would run out before you got money  to buy more: Never true  Transportation Needs: No Transportation Needs (11/21/2023)   Received from [facility]    In the past 12 months, has lack of reliable transportation kept you from medical appointments, meetings, work or from getting things needed for daily living? : No  Physical Activity: Not on file  Stress: Not on file  Social Connections: Not on file     Family History: The patient's family history includes Breast cancer in her maternal aunt and mother; Diabetes in her mother and son; Heart attack in her son; Heart disease in her mother; Hypertension in her  mother; Lung cancer in her father; Stroke in her mother.  ROS:   Please see the history of present illness.    All other systems reviewed and are negative.  EKGs/Labs/Other Studies Reviewed:    The following studies were reviewed today:   EKG:    08/20/24: Normal sinus rhythm, rate 85, incomplete right bundle branch block, 02/16/2024: Normal sinus rhythm, rate 81, right bundle branch block, left posterior fascicular block, T wave inversions in inferior leads and V1-4 07/29/2023: Normal sinus rhythm, incomplete right bundle branch block, rate 98, T wave inversions in V3/4 10/13/2022: Normal sinus rhythm, rate 78, right axis deviation, T wave inversions in inferior and precordial leads 08/26/2022: Sinus tachycardia, rate 109, no ST abnormalities 04/06/22:  Normal sinus rhythm, rate 101, no ST abnormalities 02/18/2020: normal sinus rhythm, rate 108, right axis deviation, no ST abnormalities, Q waves in III, aVF 04/01/2021: Sinus rhythm. Rate 88 bpm. No ST abnormalities.  Recent Labs: 09/06/2024: ALT 20; BUN 12; Creatinine, Ser 0.65; Hemoglobin 13.4; Platelets 160; Potassium 3.8; Sodium 141  Recent Lipid Panel    Component Value Date/Time   CHOL 110 10/13/2022 1201   TRIG 145 10/13/2022 1201   HDL 43 10/13/2022 1201   CHOLHDL 2.6 10/13/2022 1201   LDLCALC 42 10/13/2022 1201    Physical Exam:    VS:  BP  134/72   Pulse 78   Ht 5' 3 (1.6 m)   Wt 154 lb 6.4 oz (70 kg)   LMP  (LMP Unknown)   SpO2 96%   BMI 27.35 kg/m     Wt Readings from Last 3 Encounters:  10/22/24 154 lb 6.4 oz (70 kg)  09/19/24 153 lb (69.4 kg)  09/13/24 153 lb (69.4 kg)     GEN:  in no acute distress HEENT: Normal NECK: No JVD CARDIAC: RRR, 3/6 systolic heart murmur loudest at the RUSB RESPIRATORY: CTAB ABDOMEN: Soft, non-tender, non-distended MUSCULOSKELETAL:  No edema; No deformity  SKIN: Warm and dry NEUROLOGIC:  Alert and oriented x 3 PSYCHIATRIC:  Normal affect   ASSESSMENT:    1. Aortic valve stenosis, etiology of cardiac valve disease unspecified   2. Coronary artery disease involving native coronary artery of native heart without angina pectoris   3. Essential hypertension   4. Hyperlipidemia, unspecified hyperlipidemia type       PLAN:    Aortic stenosis: Echocardiogram 03/30/2022 showed moderate aortic stenosis (V-max 3.0 m/s, mean gradient 24 mmHg, AVA 0.8 cm, DI 0.34).  Dr Murriel elected not to perform AVR at time of CABG 09/2022, recommended TAVR evaluation when AS progresses to severe.  Repeat echo 04/2023 showed moderate aortic stenosis (V-max 3.0 m/s, mean gradient 21 mmHg, AVA 1.1 cm, DI 0.30).  Echocardiogram 04/2024 showed moderate to severe aortic stenosis (V-max 3.1 m/s, mean gradient 23 mmHg, AVA 0.8 cm, DI 0.28).  Echocardiogram 10/2024 showed severe aortic stenosis (V-max 3.6 m/s, mean gradient 29 mmHg, AVA 0.8 cm, DI 0.21). - Previously had been asymptomatic but now reporting dyspnea with exertion.  Suspect this is from her severe aortic stenosis.  Will refer to valve clinic to evaluate for TAVR  CAD: Calcium  score 1176 (95th percentile) on 05/25/2022.  Denied any chest pain or dyspnea.  Cardiac PET scan on 08/24/2022 showed high risk findings, including lateral ischemia, decreased EF with stress, TID, severe three-vessel coronary calcifications, though absolute blood flows were  normal. LHC 09/01/2022 showed 80% distal left main stenosis.  She underwent CABG x3 on 09/20/2022 (LIMA to LAD, SVG-OM1, SVG-OM  2). -Continue aspirin   -Continue atorvastatin  80 mg daily  Hypertension: On metoprolol  25 mg twice daily.  Appears controlled.   Hyperlipidemia: On atorvastatin  80 mg daily.  LDL 66 on 03/08/24  Type 2 diabetes: On Metformin , Januvia, Jardiance .  A1c 8.4% on 09/2024   RTC in 4 months    Medication Adjustments/Labs and Tests Ordered: Current medicines are reviewed at length with the patient today.  Concerns regarding medicines are outlined above.  Orders Placed This Encounter  Procedures   Ambulatory referral to Structural Heart/Valve Clinic (only at [facility])   No orders of the defined types were placed in this encounter.   Patient Instructions  Medication Instructions:  Your physician recommends that you continue on your current medications as directed. Please refer to the Current Medication list given to you today.  *If you need a refill on your cardiac medications before your next appointment, please call your pharmacy*  Lab Work: NONE If you have labs (blood work) drawn today and your tests are completely normal, you will receive your results only by: MyChart Message (if you have MyChart) OR A paper copy in the mail If you have any lab test that is abnormal or we need to change your treatment, we will call you to review the results.  Testing/Procedures: NONE  Follow-Up: At [facility], you and your health needs are our priority.  As part of our continuing mission to provide you with exceptional heart care, our providers are all part of one team.  This team includes your primary Cardiologist (physician) and Advanced Practice Providers or APPs (Physician Assistants and Nurse Practitioners) who all work together to provide you with the care you need, when you need it.  Your next appointment:   4 month(s)  Provider:   Lonni LITTIE Nanas, MD   We recommend signing up for the patient portal called MyChart.  Sign up information is provided on this After Visit Summary.  MyChart is used to connect with patients for Virtual Visits (Telemedicine).  Patients are able to view lab/test results, encounter notes, upcoming appointments, etc.  Non-urgent messages can be sent to your provider as well.   To learn more about what you can do with MyChart, go to [email or web address].   Other Instructions Referral to Structural Heart/ Valve Clinic              Signed, Lonni LITTIE Nanas, MD  10/22/2024 2:42 PM    [facility]

## 2024-10-22 ENCOUNTER — Ambulatory Visit: Attending: Cardiology | Admitting: Cardiology

## 2024-10-22 ENCOUNTER — Encounter: Payer: Self-pay | Admitting: Cardiology

## 2024-10-22 VITALS — BP 134/72 | HR 78 | Ht 63.0 in | Wt 154.4 lb

## 2024-10-22 DIAGNOSIS — I350 Nonrheumatic aortic (valve) stenosis: Secondary | ICD-10-CM

## 2024-10-22 DIAGNOSIS — E785 Hyperlipidemia, unspecified: Secondary | ICD-10-CM | POA: Diagnosis not present

## 2024-10-22 DIAGNOSIS — I2510 Atherosclerotic heart disease of native coronary artery without angina pectoris: Secondary | ICD-10-CM

## 2024-10-22 DIAGNOSIS — I10 Essential (primary) hypertension: Secondary | ICD-10-CM

## 2024-10-22 NOTE — Patient Instructions (Signed)
 Medication Instructions:  Your physician recommends that you continue on your current medications as directed. Please refer to the Current Medication list given to you today.  *If you need a refill on your cardiac medications before your next appointment, please call your pharmacy*  Lab Work: NONE If you have labs (blood work) drawn today and your tests are completely normal, you will receive your results only by: MyChart Message (if you have MyChart) OR A paper copy in the mail If you have any lab test that is abnormal or we need to change your treatment, we will call you to review the results.  Testing/Procedures: NONE  Follow-Up: At [facility], you and your health needs are our priority.  As part of our continuing mission to provide you with exceptional heart care, our providers are all part of one team.  This team includes your primary Cardiologist (physician) and Advanced Practice Providers or APPs (Physician Assistants and Nurse Practitioners) who all work together to provide you with the care you need, when you need it.  Your next appointment:   4 month(s)  Provider:   Lonni LITTIE Nanas, MD   We recommend signing up for the patient portal called MyChart.  Sign up information is provided on this After Visit Summary.  MyChart is used to connect with patients for Virtual Visits (Telemedicine).  Patients are able to view lab/test results, encounter notes, upcoming appointments, etc.  Non-urgent messages can be sent to your provider as well.   To learn more about what you can do with MyChart, go to [email or web address].   Other Instructions Referral to Structural Heart/ Valve Clinic

## 2024-10-25 ENCOUNTER — Encounter: Payer: Self-pay | Admitting: Cardiovascular Disease

## 2024-10-25 ENCOUNTER — Ambulatory Visit: Attending: Cardiovascular Disease | Admitting: Cardiovascular Disease

## 2024-10-25 VITALS — BP 114/68 | HR 82 | Ht 63.0 in | Wt 154.0 lb

## 2024-10-25 DIAGNOSIS — I350 Nonrheumatic aortic (valve) stenosis: Secondary | ICD-10-CM

## 2024-10-25 LAB — CBC
Hematocrit: 41.2 % (ref 34.0–46.6)
Hemoglobin: 13.5 g/dL (ref 11.1–15.9)
MCH: 31.2 pg (ref 26.6–33.0)
MCHC: 32.8 g/dL (ref 31.5–35.7)
MCV: 95 fL (ref 79–97)
Platelets: 184 x10E3/uL (ref 150–450)
RBC: 4.33 x10E6/uL (ref 3.77–5.28)
RDW: 12.8 % (ref 11.7–15.4)
WBC: 5.8 x10E3/uL (ref 3.4–10.8)

## 2024-10-25 LAB — BASIC METABOLIC PANEL WITH GFR
BUN/Creatinine Ratio: 18 (ref 12–28)
BUN: 14 mg/dL (ref 8–27)
CO2: 19 mmol/L — ABNORMAL LOW (ref 20–29)
Calcium: 9.6 mg/dL (ref 8.7–10.3)
Chloride: 105 mmol/L (ref 96–106)
Creatinine, Ser: 0.76 mg/dL (ref 0.57–1.00)
Glucose: 115 mg/dL — ABNORMAL HIGH (ref 70–99)
Potassium: 4.3 mmol/L (ref 3.5–5.2)
Sodium: 140 mmol/L (ref 134–144)
eGFR: 80 mL/min/1.73 (ref >59)

## 2024-10-25 NOTE — Progress Notes (Signed)
 Pre Surgical Assessment: 5 M Walk Test  56M=16.60ft  5 Meter Walk Test- trial 1: 5.04 seconds 5 Meter Walk Test- trial 2: 5.10 seconds 5 Meter Walk Test- trial 3: 5.06 seconds 5 Meter Walk Test Average: 5.07 seconds

## 2024-10-25 NOTE — Patient Instructions (Signed)
 Medication Instructions:  .Your physician recommends that you continue on your current medications as directed. Please refer to the Current Medication list given to you today.  *If you need a refill on your cardiac medications before your next appointment, please call your pharmacy*  Lab Work: Have lab work drawn in the lab on the first floor today--CBC and BMP If you have labs (blood work) drawn today and your tests are completely normal, you will receive your results only by: MyChart Message (if you have MyChart) OR A paper copy in the mail If you have any lab test that is abnormal or we need to change your treatment, we will call you to review the results.  Testing/Procedures: Your physician has requested that you have a cardiac catheterization. Cardiac catheterization is used to diagnose and/or treat various heart conditions. Doctors may recommend this procedure for a number of different reasons. The most common reason is to evaluate chest pain. Chest pain can be a symptom of coronary artery disease (CAD), and cardiac catheterization can show whether plaque is narrowing or blocking your heart's arteries. This procedure is also used to evaluate the valves, as well as measure the blood flow and oxygen levels in different parts of your heart. For further information please visit [email or web address] Please follow instruction sheet, as given. Scheduled for 12/3  Follow-Up: At [facility], you and your health needs are our priority.  As part of our continuing mission to provide you with exceptional heart care, our providers are all part of one team.  This team includes your primary Cardiologist (physician) and Advanced Practice Providers or APPs (Physician Assistants and Nurse Practitioners) who all work together to provide you with the care you need, when you need it.  Your next appointment:   To be arranged after procedure  Provider:   Dr Verlin    We recommend signing up for  the patient portal called MyChart.  Sign up information is provided on this After Visit Summary.  MyChart is used to connect with patients for Virtual Visits (Telemedicine).  Patients are able to view lab/test results, encounter notes, upcoming appointments, etc.  Non-urgent messages can be sent to your provider as well.   To learn more about what you can do with MyChart, go to [email or web address].   Other Instructions  [facility] A DEPT OF [facility] AT [address] A DEPT OF THE [facility] [address] [place] [address] Dept: [phone] Loc: [phone]  Sheila Keller  10/25/2024  You are scheduled for a Cardiac Catheterization on Wednesday, December 3 with Dr. Lonni Verlin.  1. Please arrive at the [facility] (Main Entrance A) at [facility]: [address] [place], [place] [address] at 6:30 AM (This time is 2 hour(s) before your procedure to ensure your preparation).   Free valet parking service is available. You will check in at ADMITTING. The support person will be asked to wait in the waiting room.  It is OK to have someone drop you off and come back when you are ready to be discharged.    Special note: Every effort is made to have your procedure done on time. Please understand that emergencies sometimes delay scheduled procedures.  2. Diet: Nothing to eat after midnight.   3. Hydration: You need to be well hydrated before your procedure. On December 3, you may drink approved liquids (see below) until 2 hours before the procedure, with 16 oz  of water as your last intake.   List of approved liquids water, clear juice, clear tea, black coffee, fruit juices, non-citric and without pulp, carbonated beverages, Gatorade, Kool -Aid, plain Jello-O and plain ice popsicles.  4. Labs: done 11/20  5. Medication instructions in preparation for your procedure:  Do not take metformin  the morning of the  procedure and for 48 hours after procedure Do not take Jardiance , Januvia or any diabetes medication the morning of the procedure   Contrast Allergy: No  On the morning of your procedure, take your Aspirin  81 mg and any morning medicines NOT listed above.  You may use sips of water.  6. Plan to go home the same day, you will only stay overnight if medically necessary. 7. Bring a current list of your medications and current insurance cards. 8. You MUST have a responsible person to drive you home. 9. Someone MUST be with you the first 24 hours after you arrive home or your discharge will be delayed. 10. Please wear clothes that are easy to get on and off and wear slip-on shoes.  Thank you for allowing us  to care for you!   -- [facility]

## 2024-10-25 NOTE — H&P (View-Only) (Signed)
 [facility] Consult Note  Chief Complaint  Patient presents with   New Patient (Initial Visit)    Severe aortic stenosis    History of Present Illness: 78 yo female with history of CAD s/p CABG, DM, HTN, HLD and moderate to severe aortic stenosis who is here today as a new consult, referred by Dr. Kate, for further discussion regarding her aortic stenosis and possible TAVR. She has CAD and has undergone 3V CABG in October 2023 (LIMA to LAD, SVG to OM1, SVG to OM2). She has been followed for moderate aortic stenosis. Echo in May 2025 with normal LV systolic function and paradoxical low flow/low gradient moderately severe to severe aortic stenosis with mean gradient 29 mmHg, AVA 0.8 cm2, DI 0.21. Echo November 2025 with LVEF=65-70%. Grade II diastolic dysfunction. Severe left atrial enlargement. Moderately severe to severe paradoxical low flow/low gradient aortic stenosis with mean gradient 22 mmHg, AVA 0.93 cm2, DI 0.25, SVI 38.   She tells me today that she has mild dyspnea with exertion when mopping or with strenuous chores but overall she feels great and has very little limitation in her activities of daily living. No chest pain, palpitations, dizziness, near syncope, syncope or LE edema. She lives in [place], [place] alone. Her son lives in [place], [place]. She is retired from working in a nursing home. She has no active dental issues. She goes to dentist regularly.   Primary Care Physician: Sun, Vyvyan, MD Primary Cardiologist: Kate Referring Cardiologist: Kate  Past Medical History:  Diagnosis Date   Anxiety    Breast lump 08/2017   no cancer/ [facility]   Chronic cystitis    Diabetes mellitus    type 2   Elevated cholesterol    Heart murmur    Aortic valve stenosis.  Sees cardiologist with [facility], no current problems per patient   Hypertension    Mild dysplasia of cervix (CIN I)    Osteopenia 02/2018   T score -1.8 FRAX 11% / 2.2%    Past  Surgical History:  Procedure Laterality Date   BREAST BIOPSY Left    CATARACT EXTRACTION Bilateral    x2    CERVICAL BIOPSY  W/ LOOP ELECTRODE EXCISION  2009   CHOLECYSTECTOMY     colonoscopy     COLPOSCOPY     CORONARY ARTERY BYPASS GRAFT N/A 09/20/2022   Procedure: CORONARY ARTERY BYPASS GRAFTING (CABG) X3 USING ENDOSCOPICALLY HARVESTED RIGHT GREATER SAPHENOUS VEIN;  Surgeon: Murriel Toribio DEL, MD;  Location: [facility] OR;  Service: Open Heart Surgery;  Laterality: N/A;   DILATION AND CURETTAGE OF UTERUS     IR THORACENTESIS ASP PLEURAL SPACE W/IMG GUIDE  10/08/2022   LEFT HEART CATH AND CORONARY ANGIOGRAPHY N/A 09/01/2022   Procedure: LEFT HEART CATH AND CORONARY ANGIOGRAPHY;  Surgeon: Claudene Victory ORN, MD;  Location: [facility] INVASIVE CV LAB;  Service: Cardiovascular;  Laterality: N/A;   TEE WITHOUT CARDIOVERSION N/A 09/20/2022   Procedure: TRANSESOPHAGEAL ECHOCARDIOGRAM (TEE);  Surgeon: Murriel Toribio DEL, MD;  Location: [facility] OR;  Service: Open Heart Surgery;  Laterality: N/A;   TUBAL LIGATION     vocal cord surgery     nodules excised    Current Outpatient Medications  Medication Sig Dispense Refill   aspirin  EC (ASPIRIN  LOW DOSE) 81 MG tablet TAKE 1 TABLET BY MOUTH EVERY DAY 90 tablet 2   atorvastatin  (LIPITOR ) 80 MG tablet TAKE 1 TABLET BY MOUTH EVERY DAY 90 tablet 3   Cholecalciferol (VITAMIN D3) 2000  units TABS Take 2,000 Units by mouth every morning.     diphenhydrAMINE (BENADRYL) 25 mg capsule Take 25 mg by mouth every 6 (six) hours as needed for allergies.     docusate sodium  (COLACE) 100 MG capsule Take 100 mg by mouth daily.     empagliflozin  (JARDIANCE ) 25 MG TABS tablet Take 25 mg by mouth daily.     estradiol (ESTRACE) 0.01 % CREA vaginal cream Apply 0.5g to vulva nightly for 2 weeks then 2 times a week. 42.5 g 1   metFORMIN  (GLUCOPHAGE ) 1000 MG tablet Take 1,000 mg by mouth 2 (two) times daily.     metoprolol  tartrate (LOPRESSOR ) 25 MG tablet TAKE 1 TABLET BY MOUTH TWICE A DAY 180 tablet  3   sitaGLIPtin (JANUVIA) 100 MG tablet Take 100 mg by mouth daily.     No current facility-administered medications for this visit.    Allergies  Allergen Reactions   Bee Venom Hives   Farxiga [Dapagliflozin] Other (See Comments)    Pt is unaware of allergy or reaction    Penicillins     Has patient had a PCN reaction causing immediate rash, facial/tongue/throat swelling, SOB or lightheadedness with hypotension: Yes Has patient had a PCN reaction causing severe rash involving mucus membranes or skin necrosis: Yes Has patient had a PCN reaction that required hospitalization No Has patient had a PCN reaction occurring within the last 10 years: No If all of the above answers are NO, then may proceed with Cephalosporin use.     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 Less than 5 partners in her life.  Tubal Ligation  Other Topics Concern   Not on file  Social History Narrative   Not on file   Social Drivers of Health   Financial Resource Strain: Not on file  Food Insecurity: Low Risk  (11/21/2023)   Received from [facility]   Hunger Vital Sign    Within the past 12 months, the food you bought just didn't last and you didn't have money to get more. : Never true    Within the past 12 months, you worried that your food would run out before you got money to buy more: Never true  Transportation Needs: No  Transportation Needs (11/21/2023)   Received from [facility]    In the past 12 months, has lack of reliable transportation kept you from medical appointments, meetings, work or from getting things needed for daily living? : No  Physical Activity: Not on file  Stress: Not on file  Social Connections: Not on file  Intimate Partner Violence: Not on file    Family History  Problem Relation Age of Onset   Diabetes Mother    Hypertension Mother    Heart disease Mother    Stroke Mother  Breast cancer Mother        Age 86   Lung cancer Father    Diabetes Son    Heart attack Son    Breast cancer Maternal Aunt        Age 78's    Review of Systems:  As stated in the HPI and otherwise negative.   BP 114/68   Pulse 82   Ht 5' 3 (1.6 m)   Wt 154 lb (69.9 kg)   LMP  (LMP Unknown)   SpO2 95%   BMI 27.28 kg/m   Physical Examination: General: Well developed, well nourished, NAD  HEENT: OP clear, mucus membranes moist  SKIN: warm, dry. No rashes. Neuro: No focal deficits  Musculoskeletal: Muscle strength 5/5 all ext  Psychiatric: Mood and affect normal  Neck: No JVD, no carotid bruits, no thyromegaly, no lymphadenopathy.  Lungs:Clear bilaterally, no wheezes, rhonci, crackles Cardiovascular: Regular rate and rhythm. Loud, harsh, late peaking systolic murmur.  Abdomen:Soft. Bowel sounds present. Non-tender.  Extremities: No lower extremity edema. Pulses are 2 + in the bilateral DP/PT.  EKG:  EKG is ordered today. The ekg ordered today demonstrates  EKG Interpretation Date/Time:  Thursday October 25 2024 13:29:26 EST Ventricular Rate:  82 PR Interval:  180 QRS Duration:  112 QT Interval:  372 QTC Calculation: 434 R Axis:   131  Text Interpretation: Normal sinus rhythm Incomplete right bundle branch block Nonspecific ST and T wave abnormality Confirmed by Verlin Bruckner [phone]) on 10/25/2024 1:36:24 PM    Echo 10/08/24: 1. Left ventricular  ejection fraction, by estimation, is 65 to 70%. Left  ventricular ejection fraction by 3D volume is 69 %. The left ventricle has  normal function. The left ventricle has no regional wall motion  abnormalities. Left ventricular diastolic   parameters are consistent with Grade II diastolic dysfunction  (pseudonormalization). Elevated left atrial pressure.   2. Right ventricular systolic function is mildly reduced. The right  ventricular size is normal. There is normal pulmonary artery systolic  pressure.   3. Left atrial size was severely dilated.   4. Right atrial size was mildly dilated.   5. The mitral valve is grossly normal. Trivial mitral valve  regurgitation.   6. The aortic valve is calcified. Aortic valve regurgitation is mild.  Moderate to severe aortic valve stenosis. Aortic valve area, by VTI  measures 0.96 cm. Aortic valve mean gradient measures 22.2 mmHg. Aortic  valve Vmax measures 3.42 m/s.   7. The inferior vena cava is normal in size with greater than 50%  respiratory variability, suggesting right atrial pressure of 3 mmHg.   Comparison(s): A prior study was performed on 04/06/2024. Diastolic  dysfunction is now Grade II with increased LAP. The aortic valve stenosis  is stable.   FINDINGS   Left Ventricle: Left ventricular ejection fraction, by estimation, is 65  to 70%. Left ventricular ejection fraction by 3D volume is 69 %. The left  ventricle has normal function. The left ventricle has no regional wall  motion abnormalities. The left  ventricular internal cavity size was normal in size. There is no left  ventricular hypertrophy. Left ventricular diastolic parameters are  consistent with Grade II diastolic dysfunction (pseudonormalization).  Elevated left atrial pressure.   Right Ventricle: The right ventricular size is normal. Right vetricular  wall thickness was not assessed. Right ventricular systolic function is  mildly reduced. There is normal pulmonary  artery systolic pressure. The  tricuspid regurgitant velocity is 2.68   m/s, and  with an assumed right atrial pressure of 3 mmHg, the estimated  right ventricular systolic pressure is 31.7 mmHg.   Left Atrium: Left atrial size was severely dilated.   Right At[facility]: Right atrial size was mildly dilated.   Pericardium: There is no evidence of pericardial effusion.   Mitral Valve: The mitral valve is grossly normal. There is mild thickening  of the mitral valve leaflet(s). There is mild calcification of the mitral  valve leaflet(s). Mild to moderate mitral annular calcification. Trivial  mitral valve regurgitation.   Tricuspid Valve: The tricuspid valve is normal in structure. Tricuspid  valve regurgitation is mild . No evidence of tricuspid stenosis.   Aortic Valve: The aortic valve is calcified. Aortic valve regurgitation is  mild. Aortic regurgitation PHT measures 259 msec. Moderate to severe  aortic stenosis is present. Aortic valve mean gradient measures 22.2 mmHg.  Aortic valve peak gradient measures   46.7 mmHg. Aortic valve area, by VTI measures 0.96 cm. DI 0.25   Pulmonic Valve: The pulmonic valve was normal in structure. Pulmonic valve  regurgitation is mild. No evidence of pulmonic stenosis.   Aorta: The aortic root and ascending aorta are structurally normal, with  no evidence of dilitation.   Venous: The inferior vena cava is normal in size with greater than 50%  respiratory variability, suggesting right atrial pressure of 3 mmHg.   IAS/Shunts: No atrial level shunt detected by color flow Doppler.   Additional Comments: 3D was performed not requiring image post processing  on an independent workstation and was normal.     LEFT VENTRICLE  PLAX 2D  LVIDd:         3.30 cm         Diastology  LVIDs:         1.80 cm         LV e' medial:    5.38 cm/s  LV PW:         1.10 cm         LV E/e' medial:  17.1  LV IVS:        0.90 cm         LV e' lateral:   6.47 cm/s   LVOT diam:     2.20 cm         LV E/e' lateral: 14.3  LV SV:         65  LV SV Index:   38  LVOT Area:     3.80 cm        3D Volume EF  LV IVRT:       139 msec        LV 3D EF:    Left                                              ventricul                                              ar                                              ejection  fraction                                              by 3D                                              volume is                                              69 %.                                   3D Volume EF:                                 3D EF:        69 %                                 LV EDV:       76 ml                                 LV ESV:       23 ml                                 LV SV:        53 ml   RIGHT VENTRICLE            IVC  RV Basal diam:  3.30 cm    IVC diam: 1.30 cm  RV S prime:     8.17 cm/s  TAPSE (M-mode): 1.1 cm     PULMONARY VEINS  RVSP:           31.7 mmHg  Diastolic Velocity: 50.30 cm/s                             S/D Velocity:       1.40                             Systolic Velocity:  71.30 cm/s   LEFT ATRIUM             Index        RIGHT ATRIUM           Index  LA diam:        4.30 cm 2.49 cm/m   RA Pressure: 3.00 mmHg  LA Vol (A2C):   76.9 ml 44.57 ml/m  RA Area:     15.50 cm  LA Vol (A4C):   60.5 ml 35.06 ml/m  RA Volume:   40.70 ml  23.59 ml/m  LA Biplane Vol: 73.7 ml 42.71 ml/m   AORTIC VALVE  AV Area (  Vmax):    0.93 cm  AV Area (Vmean):   1.01 cm  AV Area (VTI):     0.96 cm  AV Vmax:           341.60 cm/s  AV Vmean:          210.400 cm/s  AV VTI:            0.676 m  AV Peak Grad:      46.7 mmHg  AV Mean Grad:      22.2 mmHg  LVOT Vmax:         83.55 cm/s  LVOT Vmean:        55.700 cm/s  LVOT VTI:          0.172 m  LVOT/AV VTI ratio: 0.25  AI PHT:            259 msec    AORTA  Ao Root diam: 2.90 cm  Ao Asc diam:  3.30 cm   MITRAL  VALVE                TRICUSPID VALVE  MV Area (PHT)  cm          TR Peak grad:   28.7 mmHg  MV Decel Time: 263 msec     TR Vmax:        268.00 cm/s  MV E velocity: 92.20 cm/s   Estimated RAP:  3.00 mmHg  MV A velocity: 117.50 cm/s  RVSP:           31.7 mmHg  MV E/A ratio:  0.78                              SHUNTS                              Systemic VTI:  0.17 m                              Systemic Diam: 2.20 cm   Recent Labs: 09/06/2024: ALT 20; BUN 12; Creatinine, Ser 0.65; Hemoglobin 13.4; Platelets 160; Potassium 3.8; Sodium 141    Wt Readings from Last 3 Encounters:  10/25/24 154 lb (69.9 kg)  10/22/24 154 lb 6.4 oz (70 kg)  09/19/24 153 lb (69.4 kg)    Assessment and Plan:   1. Severe Aortic Valve Stenosis: She has moderately severe to severe paradoxical low flow/low gradient aortic stenosis. NYHA class 2 symptoms. I have personally reviewed the echo images. The aortic valve is thickened and calcified with limited leaflet mobility but the valve is seen to open some on the echo images. We will get more information on valve severity with her cath and CT imaging. She overall asymptomatic. Given advanced age and prior open chest surgery, she is not a good candidate for conventional AVR by surgical approach. I think she may be a good candidate for TAVR.   I have reviewed the natural history of aortic stenosis with the patient and their family members  who are present today. We have discussed the limitations of medical therapy and the poor prognosis associated with symptomatic aortic stenosis. We have reviewed potential treatment options, including palliative medical therapy, conventional surgical aortic valve replacement, and transcatheter aortic valve replacement. We discussed treatment options in the context of the patient's specific comorbid medical conditions.  She would like to proceed with planning for TAVR. I will arrange a right and left heart catheterization at [facility] 11/07/24 at  8:30am. Risks and benefits of the cath procedure and the valve procedure are reviewed with the patient. After the cath, she will have a cardiac CT, CTA of the chest/abdomen and pelvis and will then be referred to see one of the CT surgeons on our TAVR team.   CBC and BMET today    Labs/ tests ordered today include:   Orders Placed This Encounter  Procedures   CBC   Basic Metabolic Panel (BMET)   EKG 12-Lead   Disposition:   F/U will be arranged with the structural team  Signed, Lonni Cash, MD, [facility] 10/25/2024 3:00 PM    [facility] [address] [place], [place], [place]  [address] Phone: [phone]; Fax: [phone]

## 2024-10-25 NOTE — Progress Notes (Signed)
 [facility] Consult Note  Chief Complaint  Patient presents with   New Patient (Initial Visit)    Severe aortic stenosis    History of Present Illness: 78 yo female with history of CAD s/p CABG, DM, HTN, HLD and moderate to severe aortic stenosis who is here today as a new consult, referred by Dr. Kate, for further discussion regarding her aortic stenosis and possible TAVR. She has CAD and has undergone 3V CABG in October 2023 (LIMA to LAD, SVG to OM1, SVG to OM2). She has been followed for moderate aortic stenosis. Echo in May 2025 with normal LV systolic function and paradoxical low flow/low gradient moderately severe to severe aortic stenosis with mean gradient 29 mmHg, AVA 0.8 cm2, DI 0.21. Echo November 2025 with LVEF=65-70%. Grade II diastolic dysfunction. Severe left atrial enlargement. Moderately severe to severe paradoxical low flow/low gradient aortic stenosis with mean gradient 22 mmHg, AVA 0.93 cm2, DI 0.25, SVI 38.   She tells me today that she has mild dyspnea with exertion when mopping or with strenuous chores but overall she feels great and has very little limitation in her activities of daily living. No chest pain, palpitations, dizziness, near syncope, syncope or LE edema. She lives in [place], [place] alone. Her son lives in [place], [place]. She is retired from working in a nursing home. She has no active dental issues. She goes to dentist regularly.   Primary Care Physician: Sun, Vyvyan, MD Primary Cardiologist: Kate Referring Cardiologist: Kate  Past Medical History:  Diagnosis Date   Anxiety    Breast lump 08/2017   no cancer/ [facility]   Chronic cystitis    Diabetes mellitus    type 2   Elevated cholesterol    Heart murmur    Aortic valve stenosis.  Sees cardiologist with [facility], no current problems per patient   Hypertension    Mild dysplasia of cervix (CIN I)    Osteopenia 02/2018   T score -1.8 FRAX 11% / 2.2%    Past  Surgical History:  Procedure Laterality Date   BREAST BIOPSY Left    CATARACT EXTRACTION Bilateral    x2    CERVICAL BIOPSY  W/ LOOP ELECTRODE EXCISION  2009   CHOLECYSTECTOMY     colonoscopy     COLPOSCOPY     CORONARY ARTERY BYPASS GRAFT N/A 09/20/2022   Procedure: CORONARY ARTERY BYPASS GRAFTING (CABG) X3 USING ENDOSCOPICALLY HARVESTED RIGHT GREATER SAPHENOUS VEIN;  Surgeon: Murriel Toribio DEL, MD;  Location: [facility] OR;  Service: Open Heart Surgery;  Laterality: N/A;   DILATION AND CURETTAGE OF UTERUS     IR THORACENTESIS ASP PLEURAL SPACE W/IMG GUIDE  10/08/2022   LEFT HEART CATH AND CORONARY ANGIOGRAPHY N/A 09/01/2022   Procedure: LEFT HEART CATH AND CORONARY ANGIOGRAPHY;  Surgeon: Claudene Victory ORN, MD;  Location: [facility] INVASIVE CV LAB;  Service: Cardiovascular;  Laterality: N/A;   TEE WITHOUT CARDIOVERSION N/A 09/20/2022   Procedure: TRANSESOPHAGEAL ECHOCARDIOGRAM (TEE);  Surgeon: Murriel Toribio DEL, MD;  Location: [facility] OR;  Service: Open Heart Surgery;  Laterality: N/A;   TUBAL LIGATION     vocal cord surgery     nodules excised    Current Outpatient Medications  Medication Sig Dispense Refill   aspirin  EC (ASPIRIN  LOW DOSE) 81 MG tablet TAKE 1 TABLET BY MOUTH EVERY DAY 90 tablet 2   atorvastatin  (LIPITOR ) 80 MG tablet TAKE 1 TABLET BY MOUTH EVERY DAY 90 tablet 3   Cholecalciferol (VITAMIN D3) 2000  units TABS Take 2,000 Units by mouth every morning.     diphenhydrAMINE (BENADRYL) 25 mg capsule Take 25 mg by mouth every 6 (six) hours as needed for allergies.     docusate sodium  (COLACE) 100 MG capsule Take 100 mg by mouth daily.     empagliflozin  (JARDIANCE ) 25 MG TABS tablet Take 25 mg by mouth daily.     estradiol (ESTRACE) 0.01 % CREA vaginal cream Apply 0.5g to vulva nightly for 2 weeks then 2 times a week. 42.5 g 1   metFORMIN  (GLUCOPHAGE ) 1000 MG tablet Take 1,000 mg by mouth 2 (two) times daily.     metoprolol  tartrate (LOPRESSOR ) 25 MG tablet TAKE 1 TABLET BY MOUTH TWICE A DAY 180 tablet  3   sitaGLIPtin (JANUVIA) 100 MG tablet Take 100 mg by mouth daily.     No current facility-administered medications for this visit.    Allergies  Allergen Reactions   Bee Venom Hives   Farxiga [Dapagliflozin] Other (See Comments)    Pt is unaware of allergy or reaction    Penicillins     Has patient had a PCN reaction causing immediate rash, facial/tongue/throat swelling, SOB or lightheadedness with hypotension: Yes Has patient had a PCN reaction causing severe rash involving mucus membranes or skin necrosis: Yes Has patient had a PCN reaction that required hospitalization No Has patient had a PCN reaction occurring within the last 10 years: No If all of the above answers are NO, then may proceed with Cephalosporin use.     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 Less than 5 partners in her life.  Tubal Ligation  Other Topics Concern   Not on file  Social History Narrative   Not on file   Social Drivers of Health   Financial Resource Strain: Not on file  Food Insecurity: Low Risk  (11/21/2023)   Received from [facility]   Hunger Vital Sign    Within the past 12 months, the food you bought just didn't last and you didn't have money to get more. : Never true    Within the past 12 months, you worried that your food would run out before you got money to buy more: Never true  Transportation Needs: No  Transportation Needs (11/21/2023)   Received from [facility]    In the past 12 months, has lack of reliable transportation kept you from medical appointments, meetings, work or from getting things needed for daily living? : No  Physical Activity: Not on file  Stress: Not on file  Social Connections: Not on file  Intimate Partner Violence: Not on file    Family History  Problem Relation Age of Onset   Diabetes Mother    Hypertension Mother    Heart disease Mother    Stroke Mother  Breast cancer Mother        Age 86   Lung cancer Father    Diabetes Son    Heart attack Son    Breast cancer Maternal Aunt        Age 78's    Review of Systems:  As stated in the HPI and otherwise negative.   BP 114/68   Pulse 82   Ht 5' 3 (1.6 m)   Wt 154 lb (69.9 kg)   LMP  (LMP Unknown)   SpO2 95%   BMI 27.28 kg/m   Physical Examination: General: Well developed, well nourished, NAD  HEENT: OP clear, mucus membranes moist  SKIN: warm, dry. No rashes. Neuro: No focal deficits  Musculoskeletal: Muscle strength 5/5 all ext  Psychiatric: Mood and affect normal  Neck: No JVD, no carotid bruits, no thyromegaly, no lymphadenopathy.  Lungs:Clear bilaterally, no wheezes, rhonci, crackles Cardiovascular: Regular rate and rhythm. Loud, harsh, late peaking systolic murmur.  Abdomen:Soft. Bowel sounds present. Non-tender.  Extremities: No lower extremity edema. Pulses are 2 + in the bilateral DP/PT.  EKG:  EKG is ordered today. The ekg ordered today demonstrates  EKG Interpretation Date/Time:  Thursday October 25 2024 13:29:26 EST Ventricular Rate:  82 PR Interval:  180 QRS Duration:  112 QT Interval:  372 QTC Calculation: 434 R Axis:   131  Text Interpretation: Normal sinus rhythm Incomplete right bundle branch block Nonspecific ST and T wave abnormality Confirmed by Verlin Bruckner [phone]) on 10/25/2024 1:36:24 PM    Echo 10/08/24: 1. Left ventricular  ejection fraction, by estimation, is 65 to 70%. Left  ventricular ejection fraction by 3D volume is 69 %. The left ventricle has  normal function. The left ventricle has no regional wall motion  abnormalities. Left ventricular diastolic   parameters are consistent with Grade II diastolic dysfunction  (pseudonormalization). Elevated left atrial pressure.   2. Right ventricular systolic function is mildly reduced. The right  ventricular size is normal. There is normal pulmonary artery systolic  pressure.   3. Left atrial size was severely dilated.   4. Right atrial size was mildly dilated.   5. The mitral valve is grossly normal. Trivial mitral valve  regurgitation.   6. The aortic valve is calcified. Aortic valve regurgitation is mild.  Moderate to severe aortic valve stenosis. Aortic valve area, by VTI  measures 0.96 cm. Aortic valve mean gradient measures 22.2 mmHg. Aortic  valve Vmax measures 3.42 m/s.   7. The inferior vena cava is normal in size with greater than 50%  respiratory variability, suggesting right atrial pressure of 3 mmHg.   Comparison(s): A prior study was performed on 04/06/2024. Diastolic  dysfunction is now Grade II with increased LAP. The aortic valve stenosis  is stable.   FINDINGS   Left Ventricle: Left ventricular ejection fraction, by estimation, is 65  to 70%. Left ventricular ejection fraction by 3D volume is 69 %. The left  ventricle has normal function. The left ventricle has no regional wall  motion abnormalities. The left  ventricular internal cavity size was normal in size. There is no left  ventricular hypertrophy. Left ventricular diastolic parameters are  consistent with Grade II diastolic dysfunction (pseudonormalization).  Elevated left atrial pressure.   Right Ventricle: The right ventricular size is normal. Right vetricular  wall thickness was not assessed. Right ventricular systolic function is  mildly reduced. There is normal pulmonary  artery systolic pressure. The  tricuspid regurgitant velocity is 2.68   m/s, and  with an assumed right atrial pressure of 3 mmHg, the estimated  right ventricular systolic pressure is 31.7 mmHg.   Left Atrium: Left atrial size was severely dilated.   Right [facility]: Right atrial size was mildly dilated.   Pericardium: There is no evidence of pericardial effusion.   Mitral Valve: The mitral valve is grossly normal. There is mild thickening  of the mitral valve leaflet(s). There is mild calcification of the mitral  valve leaflet(s). Mild to moderate mitral annular calcification. Trivial  mitral valve regurgitation.   Tricuspid Valve: The tricuspid valve is normal in structure. Tricuspid  valve regurgitation is mild . No evidence of tricuspid stenosis.   Aortic Valve: The aortic valve is calcified. Aortic valve regurgitation is  mild. Aortic regurgitation PHT measures 259 msec. Moderate to severe  aortic stenosis is present. Aortic valve mean gradient measures 22.2 mmHg.  Aortic valve peak gradient measures   46.7 mmHg. Aortic valve area, by VTI measures 0.96 cm. DI 0.25   Pulmonic Valve: The pulmonic valve was normal in structure. Pulmonic valve  regurgitation is mild. No evidence of pulmonic stenosis.   Aorta: The aortic root and ascending aorta are structurally normal, with  no evidence of dilitation.   Venous: The inferior vena cava is normal in size with greater than 50%  respiratory variability, suggesting right atrial pressure of 3 mmHg.   IAS/Shunts: No atrial level shunt detected by color flow Doppler.   Additional Comments: 3D was performed not requiring image post processing  on an independent workstation and was normal.     LEFT VENTRICLE  PLAX 2D  LVIDd:         3.30 cm         Diastology  LVIDs:         1.80 cm         LV e' medial:    5.38 cm/s  LV PW:         1.10 cm         LV E/e' medial:  17.1  LV IVS:        0.90 cm         LV e' lateral:   6.47 cm/s   LVOT diam:     2.20 cm         LV E/e' lateral: 14.3  LV SV:         65  LV SV Index:   38  LVOT Area:     3.80 cm        3D Volume EF  LV IVRT:       139 msec        LV 3D EF:    Left                                              ventricul                                              ar                                              ejection  fraction                                              by 3D                                              volume is                                              69 %.                                   3D Volume EF:                                 3D EF:        69 %                                 LV EDV:       76 ml                                 LV ESV:       23 ml                                 LV SV:        53 ml   RIGHT VENTRICLE            IVC  RV Basal diam:  3.30 cm    IVC diam: 1.30 cm  RV S prime:     8.17 cm/s  TAPSE (M-mode): 1.1 cm     PULMONARY VEINS  RVSP:           31.7 mmHg  Diastolic Velocity: 50.30 cm/s                             S/D Velocity:       1.40                             Systolic Velocity:  71.30 cm/s   LEFT ATRIUM             Index        RIGHT ATRIUM           Index  LA diam:        4.30 cm 2.49 cm/m   RA Pressure: 3.00 mmHg  LA Vol (A2C):   76.9 ml 44.57 ml/m  RA Area:     15.50 cm  LA Vol (A4C):   60.5 ml 35.06 ml/m  RA Volume:   40.70 ml  23.59 ml/m  LA Biplane Vol: 73.7 ml 42.71 ml/m   AORTIC VALVE  AV Area (  Vmax):    0.93 cm  AV Area (Vmean):   1.01 cm  AV Area (VTI):     0.96 cm  AV Vmax:           341.60 cm/s  AV Vmean:          210.400 cm/s  AV VTI:            0.676 m  AV Peak Grad:      46.7 mmHg  AV Mean Grad:      22.2 mmHg  LVOT Vmax:         83.55 cm/s  LVOT Vmean:        55.700 cm/s  LVOT VTI:          0.172 m  LVOT/AV VTI ratio: 0.25  AI PHT:            259 msec    AORTA  Ao Root diam: 2.90 cm  Ao Asc diam:  3.30 cm   MITRAL  VALVE                TRICUSPID VALVE  MV Area (PHT)  cm          TR Peak grad:   28.7 mmHg  MV Decel Time: 263 msec     TR Vmax:        268.00 cm/s  MV E velocity: 92.20 cm/s   Estimated RAP:  3.00 mmHg  MV A velocity: 117.50 cm/s  RVSP:           31.7 mmHg  MV E/A ratio:  0.78                              SHUNTS                              Systemic VTI:  0.17 m                              Systemic Diam: 2.20 cm   Recent Labs: 09/06/2024: ALT 20; BUN 12; Creatinine, Ser 0.65; Hemoglobin 13.4; Platelets 160; Potassium 3.8; Sodium 141    Wt Readings from Last 3 Encounters:  10/25/24 154 lb (69.9 kg)  10/22/24 154 lb 6.4 oz (70 kg)  09/19/24 153 lb (69.4 kg)    Assessment and Plan:   1. Severe Aortic Valve Stenosis: She has moderately severe to severe paradoxical low flow/low gradient aortic stenosis. NYHA class 2 symptoms. I have personally reviewed the echo images. The aortic valve is thickened and calcified with limited leaflet mobility but the valve is seen to open some on the echo images. We will get more information on valve severity with her cath and CT imaging. She overall asymptomatic. Given advanced age and prior open chest surgery, she is not a good candidate for conventional AVR by surgical approach. I think she may be a good candidate for TAVR.   I have reviewed the natural history of aortic stenosis with the patient and their family members  who are present today. We have discussed the limitations of medical therapy and the poor prognosis associated with symptomatic aortic stenosis. We have reviewed potential treatment options, including palliative medical therapy, conventional surgical aortic valve replacement, and transcatheter aortic valve replacement. We discussed treatment options in the context of the patient's specific comorbid medical conditions.  She would like to proceed with planning for TAVR. I will arrange a right and left heart catheterization at [facility] 11/07/24 at  8:30am. Risks and benefits of the cath procedure and the valve procedure are reviewed with the patient. After the cath, she will have a cardiac CT, CTA of the chest/abdomen and pelvis and will then be referred to see one of the CT surgeons on our TAVR team.   CBC and BMET today    Labs/ tests ordered today include:   Orders Placed This Encounter  Procedures   CBC   Basic Metabolic Panel (BMET)   EKG 12-Lead   Disposition:   F/U will be arranged with the structural team  Signed, Lonni Cash, MD, [facility] 10/25/2024 3:00 PM    [facility] [address] [place], [place], [place]  [address] Phone: [phone]; Fax: [phone]

## 2024-10-26 ENCOUNTER — Ambulatory Visit: Payer: Self-pay | Admitting: Cardiovascular Disease

## 2024-11-05 ENCOUNTER — Telehealth: Payer: Self-pay | Admitting: *Deleted

## 2024-11-05 NOTE — Telephone Encounter (Signed)
 Cardiac Catheterization scheduled at [facility] for: Wednesday November 07, 2024 8:30 AM Arrival time [facility] Main Entrance A at: 6:30 AM  Diet: -Nothing to eat after midnight.  Hydration: -May drink clear liquids until 2 hours before the procedure.  Approved liquids: Water, clear tea, black coffee, fruit juices-non-citric and without pulp,Gatorade, plain Jello/popsicles.   -Please drink 16 oz of water 2 hours before procedure.  Medication instructions: -Hold:  Metformin -day of procedure and 48 hours after procedure  Januvia/Jardiance -AM of procedure -Other usual medications can be taken including aspirin  81 mg.  Plan to go home the same day, you will only stay overnight if medically necessary.  You must have responsible adult to drive you home.  Someone must be with you the first 24 hours after you arrive home.  Reviewed procedure instructions with patient.

## 2024-11-07 ENCOUNTER — Encounter (HOSPITAL_COMMUNITY)
Admission: RE | Disposition: A | Discharge status: Home / Self Care | Payer: Self-pay | Source: Home / Self Care | Attending: Cardiovascular Disease

## 2024-11-07 ENCOUNTER — Other Ambulatory Visit: Payer: Self-pay

## 2024-11-07 ENCOUNTER — Ambulatory Visit (HOSPITAL_COMMUNITY)
Admission: RE | Admit: 2024-11-07 | Discharge: 2024-11-07 | Disposition: A | Discharge status: Home / Self Care | Attending: Cardiovascular Disease | Admitting: Cardiovascular Disease

## 2024-11-07 DIAGNOSIS — I350 Nonrheumatic aortic (valve) stenosis: Secondary | ICD-10-CM

## 2024-11-07 LAB — GLUCOSE, CAPILLARY: Glucose-Capillary: 147 mg/dL — ABNORMAL HIGH (ref 70–99)

## 2024-11-07 SURGERY — LEFT HEART CATH AND CORS/GRAFTS ANGIOGRAPHY
Anesthesia: LOCAL

## 2024-11-07 MED ORDER — MIDAZOLAM HCL (PF) 2 MG/2ML IJ SOLN
INTRAMUSCULAR | Status: DC | PRN
  Administered 2024-11-07: 1 mg via INTRAVENOUS

## 2024-11-07 MED ORDER — SODIUM CHLORIDE 0.9% FLUSH: 3.0000 mL | INTRAVENOUS | Status: DC | PRN

## 2024-11-07 MED ORDER — VERAPAMIL HCL 2.5 MG/ML IV SOLN
INTRAVENOUS | Status: DC | PRN
  Administered 2024-11-07: 10 mL via INTRA_ARTERIAL

## 2024-11-07 MED ORDER — LIDOCAINE HCL (PF) 1 % IJ SOLN
INTRAMUSCULAR | Status: DC | PRN
  Administered 2024-11-07: 5 mL via INTRADERMAL

## 2024-11-07 MED ORDER — FENTANYL CITRATE (PF) 100 MCG/2ML IJ SOLN
INTRAMUSCULAR | Status: DC | PRN
  Administered 2024-11-07: 25 ug via INTRAVENOUS

## 2024-11-07 MED ORDER — FREE WATER: 500.0000 mL | Freq: Once | Status: DC

## 2024-11-07 MED ORDER — HEPARIN (PORCINE) IN NACL 1000-0.9 UT/500ML-% IV SOLN
INTRAVENOUS | Status: DC | PRN
  Administered 2024-11-07 (×2): 500 mL

## 2024-11-07 MED ORDER — FENTANYL CITRATE (PF) 100 MCG/2ML IJ SOLN
INTRAMUSCULAR | Status: AC
  Filled 2024-11-07: qty 2

## 2024-11-07 MED ORDER — SODIUM CHLORIDE 0.9% FLUSH: 3.0000 mL | Freq: Two times a day (BID) | INTRAVENOUS | Status: DC

## 2024-11-07 MED ORDER — IOHEXOL 350 MG/ML SOLN
INTRAVENOUS | Status: DC | PRN
  Administered 2024-11-07: 70 mL

## 2024-11-07 MED ORDER — ONDANSETRON HCL 4 MG/2ML IJ SOLN: 4.0000 mg | Freq: Four times a day (QID) | INTRAMUSCULAR | Status: DC | PRN

## 2024-11-07 MED ORDER — LABETALOL HCL 5 MG/ML IV SOLN: 10.0000 mg | INTRAVENOUS | Status: DC | PRN

## 2024-11-07 MED ORDER — HEPARIN SODIUM (PORCINE) 1000 UNIT/ML IJ SOLN
INTRAMUSCULAR | Status: AC
  Filled 2024-11-07: qty 10

## 2024-11-07 MED ORDER — SODIUM CHLORIDE 0.9 % IV SOLN: 250.0000 mL | INTRAVENOUS | Status: DC | PRN

## 2024-11-07 MED ORDER — ACETAMINOPHEN 325 MG PO TABS: 650.0000 mg | ORAL_TABLET | ORAL | Status: DC | PRN

## 2024-11-07 MED ORDER — HEPARIN SODIUM (PORCINE) 1000 UNIT/ML IJ SOLN
INTRAMUSCULAR | Status: DC | PRN
  Administered 2024-11-07: 3500 [IU] via INTRAVENOUS

## 2024-11-07 MED ORDER — ASPIRIN 81 MG PO CHEW: 81.0000 mg | CHEWABLE_TABLET | ORAL | Status: DC

## 2024-11-07 MED ORDER — LIDOCAINE HCL (PF) 1 % IJ SOLN
INTRAMUSCULAR | Status: AC
  Filled 2024-11-07: qty 30

## 2024-11-07 MED ORDER — HYDRALAZINE HCL 20 MG/ML IJ SOLN: 10.0000 mg | INTRAMUSCULAR | Status: DC | PRN

## 2024-11-07 MED ORDER — MIDAZOLAM HCL 2 MG/2ML IJ SOLN
INTRAMUSCULAR | Status: AC
  Filled 2024-11-07: qty 2

## 2024-11-07 MED ORDER — VERAPAMIL HCL 2.5 MG/ML IV SOLN
INTRAVENOUS | Status: AC
  Filled 2024-11-07: qty 2

## 2024-11-07 SURGICAL SUPPLY — 11 items
CATH INFINITI 5 FR LCB (CATHETERS) IMPLANT
CATH INFINITI 5FR JL4 (CATHETERS) IMPLANT
CATH INFINITI JR4 5F (CATHETERS) IMPLANT
CATH LAUNCHER 5F AL1 (CATHETERS) IMPLANT
DEVICE RAD COMP TR BAND LRG (VASCULAR PRODUCTS) IMPLANT
GLIDESHEATH SLEND SS 6F .021 (SHEATH) IMPLANT
GUIDEWIRE INQWIRE 1.5J.035X260 (WIRE) IMPLANT
KIT SYRINGE INJ CVI SPIKEX1 (MISCELLANEOUS) IMPLANT
PACK CARDIAC CATHETERIZATION (CUSTOM PROCEDURE TRAY) ×1 IMPLANT
SET ATX-X65L (MISCELLANEOUS) IMPLANT
WIRE HI TORQ VERSACORE-J 145CM (WIRE) IMPLANT

## 2024-11-07 NOTE — Discharge Instructions (Signed)

## 2024-11-07 NOTE — Progress Notes (Signed)
 Discharge instructions reviewed with patient and son at bedside. Denies questions concerns. PT tolerated PO intake. Ambulated in the hallway, was able to void without difficulty. Incision site remains clean dry and intact. No s/s of complications. PT escorted from the unit via wheel chair to personal vehicle.

## 2024-11-07 NOTE — Interval H&P Note (Signed)
 History and Physical Interval Note:  11/07/2024 7:53 AM  Sheila Keller  has presented today for surgery, with the diagnosis of aortic stenosis.  The various methods of treatment have been discussed with the patient and family. After consideration of risks, benefits and other options for treatment, the patient has consented to  Procedure(s): LEFT HEART CATH AND CORS/GRAFTS ANGIOGRAPHY (N/A) as a surgical intervention.  The patient's history has been reviewed, patient examined, no change in status, stable for surgery.  I have reviewed the patient's chart and labs.  Questions were answered to the patient's satisfaction.    Cath Lab Visit (complete for each Cath Lab visit)  Clinical Evaluation Leading to the Procedure:   ACS: No.  Non-ACS:    Anginal Classification: No Symptoms  Anti-ischemic medical therapy: Minimal Therapy (1 class of medications)  Non-Invasive Test Results: No non-invasive testing performed  Prior CABG: Previous CABG        Lonni Cash

## 2024-11-08 ENCOUNTER — Encounter (HOSPITAL_COMMUNITY): Payer: Self-pay | Admitting: Cardiovascular Disease

## 2024-11-27 ENCOUNTER — Ambulatory Visit (HOSPITAL_COMMUNITY)
Admission: RE | Admit: 2024-11-27 | Discharge: 2024-11-27 | Disposition: A | Discharge status: Home / Self Care | Source: Ambulatory Visit | Attending: Internal Medicine | Admitting: Internal Medicine

## 2024-11-27 DIAGNOSIS — I350 Nonrheumatic aortic (valve) stenosis: Secondary | ICD-10-CM | POA: Insufficient documentation

## 2024-11-27 MED ORDER — IOHEXOL 350 MG/ML SOLN
100.0000 mL | Freq: Once | INTRAVENOUS | Status: AC | PRN
  Administered 2024-11-27: 100 mL via INTRAVENOUS

## 2024-11-27 NOTE — Progress Notes (Signed)
 STS Score Risk of Mortality: 3.340%  Renal Failure: 1.082%  Permanent Stroke: 3.047%  Prolonged Ventilation: 6.992%  DSW Infection: 0.049%  Reoperation: 3.842%  Morbidity or Mortality: 12.017%  Short Length of Stay: 37.357%  Long Length of Stay: 4.229%

## 2024-12-10 ENCOUNTER — Ambulatory Visit: Payer: Self-pay | Admitting: Cardiovascular Disease

## 2024-12-11 ENCOUNTER — Telehealth: Payer: Self-pay

## 2024-12-11 NOTE — Telephone Encounter (Signed)
" °  [facility]   MULTIDISCIPLINARY HEART VALVE TEAM  The patient's echo and TAVR CT scans were reviewed this morning during valve team meeting.  Echo images showed that the AV leaflets are opening and her AV CA score by CT was 728.  The team felt that the pt has moderate AS based on testing.  With the patient complaining of mild dyspnea with exertion when performing strenuous chores, the team asked that the patient be screened for Progress CAP trial.  I contacted Amy with research and per chart review she is borderline for meeting criteria. Per Amy if the patient is interested, then we could submit her case for review.   I contacted the patient to discuss the findings of testing and team review.  The pt is happy that her valve is opening and it does not appear severe.  The pt said she feels well and would not be interested in having surgery until it is necessary.  I will cancel her appointment with Dr Lucas and arrange 6 month follow-up with Dr Verlin and an echo prior to this visit.  The patient will keep her scheduled appointment with Dr Kate on 3/16.  I reviewed symptoms of AS progression and advised the patient to contact the office if she develops any new or worsening symptoms. Pt agreed with plan.  "

## 2024-12-12 ENCOUNTER — Encounter: Admitting: Surgery

## 2024-12-20 ENCOUNTER — Ambulatory Visit: Admitting: Obstetrics and Gynecology

## 2025-02-18 ENCOUNTER — Ambulatory Visit: Admitting: Cardiology

## 2025-02-20 ENCOUNTER — Encounter: Admitting: Obstetrics and Gynecology

## 2025-03-25 ENCOUNTER — Ambulatory Visit

## 2025-05-01 ENCOUNTER — Ambulatory Visit (HOSPITAL_COMMUNITY)

## 2025-05-08 ENCOUNTER — Ambulatory Visit: Admitting: Cardiovascular Disease

## 2025-05-10 ENCOUNTER — Ambulatory Visit: Admitting: Cardiovascular Disease
